# Patient Record
Sex: Male | Born: 1945 | Race: White | Hispanic: No | Marital: Married | Smoking: Never smoker
Health system: Southern US, Community
[De-identification: ages and names within clinical notes are randomized; demographics above are authoritative.]

## PROBLEM LIST (undated history)

## (undated) DIAGNOSIS — C61 Malignant neoplasm of prostate: Secondary | ICD-10-CM

## (undated) DIAGNOSIS — I259 Chronic ischemic heart disease, unspecified: Secondary | ICD-10-CM

## (undated) DIAGNOSIS — H5440 Blindness, one eye, unspecified eye: Secondary | ICD-10-CM

## (undated) DIAGNOSIS — M1611 Unilateral primary osteoarthritis, right hip: Secondary | ICD-10-CM

## (undated) DIAGNOSIS — I10 Essential (primary) hypertension: Secondary | ICD-10-CM

## (undated) DIAGNOSIS — E7800 Pure hypercholesterolemia, unspecified: Secondary | ICD-10-CM

## (undated) DIAGNOSIS — I2510 Atherosclerotic heart disease of native coronary artery without angina pectoris: Secondary | ICD-10-CM

## (undated) HISTORY — DX: Chronic ischemic heart disease, unspecified: I25.9

## (undated) HISTORY — DX: Unilateral primary osteoarthritis, right hip: M16.11

## (undated) HISTORY — DX: Pure hypercholesterolemia, unspecified: E78.00

## (undated) HISTORY — DX: Essential (primary) hypertension: I10

## (undated) HISTORY — DX: Blindness, one eye, unspecified eye: H54.40

## (undated) HISTORY — DX: Malignant neoplasm of prostate: C61

## (undated) HISTORY — DX: Atherosclerotic heart disease of native coronary artery without angina pectoris: I25.10

## (undated) HISTORY — PX: TONSILLECTOMY AND ADENOIDECTOMY: SHX28

---

## 1967-02-17 HISTORY — PX: EYE SURGERY: SHX253

## 1996-06-21 HISTORY — PX: CORONARY ANGIOPLASTY WITH STENT PLACEMENT: SHX49

## 2001-09-27 ENCOUNTER — Encounter: Payer: Self-pay | Admitting: Orthopedic Surgery

## 2001-09-27 ENCOUNTER — Encounter: Admission: RE | Admit: 2001-09-27 | Discharge: 2001-09-27 | Payer: Self-pay | Admitting: Orthopedic Surgery

## 2002-11-07 ENCOUNTER — Ambulatory Visit (HOSPITAL_COMMUNITY): Admission: RE | Admit: 2002-11-07 | Discharge: 2002-11-07 | Payer: Self-pay | Admitting: Gastroenterology

## 2002-11-07 ENCOUNTER — Encounter (INDEPENDENT_AMBULATORY_CARE_PROVIDER_SITE_OTHER): Payer: Self-pay

## 2003-01-22 ENCOUNTER — Encounter (INDEPENDENT_AMBULATORY_CARE_PROVIDER_SITE_OTHER): Payer: Self-pay | Admitting: *Deleted

## 2003-01-22 ENCOUNTER — Ambulatory Visit (HOSPITAL_COMMUNITY): Admission: RE | Admit: 2003-01-22 | Discharge: 2003-01-22 | Payer: Self-pay | Admitting: Gastroenterology

## 2004-03-26 ENCOUNTER — Inpatient Hospital Stay (HOSPITAL_COMMUNITY): Admission: RE | Admit: 2004-03-26 | Discharge: 2004-03-28 | Payer: Self-pay | Admitting: Orthopedic Surgery

## 2007-01-27 ENCOUNTER — Inpatient Hospital Stay (HOSPITAL_COMMUNITY): Admission: RE | Admit: 2007-01-27 | Discharge: 2007-01-29 | Payer: Self-pay | Admitting: Orthopedic Surgery

## 2010-01-13 ENCOUNTER — Ambulatory Visit: Payer: Self-pay | Admitting: Cardiology

## 2010-01-23 ENCOUNTER — Ambulatory Visit: Payer: Self-pay | Admitting: Cardiology

## 2010-07-01 NOTE — Op Note (Signed)
NAME:  Ruben Henry, Ruben Henry              ACCOUNT NO.:  [id number]   MEDICAL RECORD NO.:  [id number]          PATIENT TYPE:  INP   LOCATION:  0005                         FACILITY:  [facility]   PHYSICIAN:  John L. Rendall, M.D.  DATE OF BIRTH:  1945/08/11   DATE OF PROCEDURE:  01/27/2007  DATE OF DISCHARGE:                               OPERATIVE REPORT   PREOPERATIVE DIAGNOSIS:  Osteoarthritis, left hip.   SURGICAL PROCEDURE:  Left AML total hip replacement.   POSTOPERATIVE DIAGNOSIS:  Osteoarthritis, left hip.   SURGEON:  John L. Rendall, M.D.   ASSISTANT:  Legrand Pitts. Duffy, P.A.   ANESTHESIA:  General.   PATHOLOGY:  Bone-on-bone left hip, failure of conservative measures with  total hip already on the opposite side that is functioning well.   PROCEDURE:  Under general anesthesia, the patient is placed in the right  lateral decubitus position and the left hip is prepared with DuraPrep  and draped as a sterile field.  A posterior approach is made.  The IT  band is split in the line of its fibers.  A short Charnley retractor is  inserted.  Multiple small vessels are cauterized.  The short external  rotators and hip capsule are taken down from bone and vessels along the  capsule are cauterized.  The capsule is then opened in a T-shaped manner  after carefully peeling the short external rotators from it.  The hip  was is then dislocated.  The superior femoral neck is exposed.  The IM  initiator and canal finder are used.  The femoral canal is then  progressively reamed to 16 mm.  The femoral neck is then osteotomized.  Trial rasps are then used, 12, 13.5, 15 and 16.5 narrow, which gave the  very best fit.  A calcar reamer was used to smooth the calcar.  With the  16.5 narrow seated just slightly proud a millimeter or two, it exactly  balanced the other hip's appearance on x-ray, which has done  exceptionally well.  At this point the surgeons and assistants rotated  about the table.  The  acetabulum was exposed with two wing retractors  superiorly, to cobras inferiorly.  It should be noted that the capsule  was very carefully peeled from the labrum and the wing retractors placed  in the interval between the posterior bone of the acetabulum and the  capsule.  The labrum was then excised.  The ligamentum teres was  excised.  Progressive reaming was then done, 45, 57, 49, 51, 53 reamers,  and then a 54 reamer and 55 reamer gave excellent bleeding bone.  All  debris was removed including adherent residual ligamentum teres and  labrum and then a trial seating of a 54 acetabulum bottomed out nicely.  A permanent 56 100 Series Ortho-Lock cup was then inserted and bottomed  out nicely with an excellent press fit.  The Sputnik was used to assist  in placement.  Once this was in, trial reduction off a rasp with a 32-mm  hip ball and a +1.5 neck length was tried.  This gave excellent fit,  alignment and stability through a full normal physiologic range of  motion.  Components were then dislocated and apex hole eliminator and a  permanent cross-linked poly were inserted.  The femoral component 16.5  narrow AML stem was then inserted and the +1.5 32-mm hip ball.  With all  these in place, again stability was excellent.  The hip capsule was then  closed with interrupted mattress sutures of #1 Tycron, short external  rotator reattached with #1 Tycron, IT band closed with  #1 Tycron, subcu with 0 Vicryl, 2-0 Vicryl and skin clips.  Total  operative time 1 hour 15 minutes.  Leg lengths were just as we had  planned at the end of inserting the components.  The patient returned to  recovery in good condition.  Blood loss estimated at 200 mL.      John L. Rendall, M.D.  Electronically Signed     JLR/MEDQ  D:  01/27/2007  T:  01/27/2007  Job:  [id number]

## 2010-07-04 NOTE — Op Note (Signed)
NAME:  Ruben Henry, Ruben Henry                        ACCOUNT NO.:  [id number]   MEDICAL RECORD NO.:  [id number]                   PATIENT TYPE:  AMB   LOCATION:  ENDO                                 FACILITY:  [facility]   PHYSICIAN:  Bernette Redbird, M.D.                DATE OF BIRTH:  01-Aug-1945   DATE OF PROCEDURE:  11/07/2002  DATE OF DISCHARGE:                                 OPERATIVE REPORT   PROCEDURE:  Colonoscopy with polypectomy and biopsy.   INDICATIONS:  A 65 year old gentleman who underwent a screening flexible  sigmoidoscopy by Dr. Janey Greaser recently, showing a polyp at about 15 cm.  A  previous sigmoidoscopy about five years earlier had been reportedly normal.   FINDINGS:  Very large (5 cm) polyp at about 12 cm from the external anal  opening.  Several small polyps in the proximal colon.   CONSENT:  The nature, purpose and risks of this procedure had been discussed  with the patient who provided written consent.   SEDATION:  Prior to and during the course of the procedure totaled Fentanyl  75 mcg and Versed 8 mg IV without arrhythmias or desaturation.   DESCRIPTION OF PROCEDURE:  Digital exam of the prostate was normal.   The Olympus adjustable tension pediatric video colonoscope was advanced to  the terminal ileum which had a normal appearance and pull-back was then  performed.  Some external abdominal compression was required to help control  looping.   Just above the cecum was a small 3 x 4 mm polyp, sessile, removed by snare  technique and suctioned through the scope.  In the region of the hepatic  flexure, I encountered two or three small sessile polyps removed by cold  biopsy technique.  The main abnormality on this exam, however, was a  lobulated semi-pedunculated, irregular but benign-appearing polyp at about  12 cm from the external anal opening.  It probably measured 5 cm or even 7  cm across in greatest dimension and probably 4-5 cm wide.  It was partially  attached to the rectal vault but was not fully plastered or fixated to the  vault.  The margin of the fixed portion of the polyp was injected with  1:10,000 epinephrine, a total of about 2 mL.  The polyp was then carved off  in a successive fashion, with virtually complete hemostasis and fairly deep  cautery in the central portion of one section of the polyp but no evidence  of frank perforation.  The margin of the polyp was not completely excised  but was left behind with the anticipation that it would probably slough at a  later time or could be touched up on a future occasion.  I did not want to  apply excessive heat to the polyp base.  Appropriately 5 carvings were done,  removing two or three dominant pieces and then several smaller pieces.  It  is believed  all fragments were retrieved for histologic analysis.  There was  good hemostasis and again no evidence of significant excess cautery at the  conclusion of the procedure.  Retroflexion was not performed.   The patient had no evidence of colitis, vascular ectasia or diverticulosis.   He tolerated the procedure well and there were no apparent complications.   IMPRESSION:  1. Large rectal polyp, mostly removed, as discussed above.  2. Several small polyps in the proximal colon.   PLAN:  1. Await pathology.  2. Anticipate followup sigmoidoscopic evaluation (following a complete prep)     in three to six months for removal of any residual polyp tissue at the     polypectomy site in the rectum.                                                Bernette Redbird, M.D.    RB/MEDQ  D:  11/07/2002  T:  11/07/2002  Job:  [id number]   cc:   Al Decant. Janey Greaser, MD  [address]  [place]  [place] [address]  Fax: [phone]

## 2010-07-04 NOTE — Op Note (Signed)
NAME:  Ruben Henry, Ruben Henry              ACCOUNT NO.:  [id number]   MEDICAL RECORD NO.:  [id number]          PATIENT TYPE:  INP   LOCATION:  0009                         FACILITY:  [facility]   PHYSICIAN:  John L. Rendall III, M.D.DATE OF BIRTH:  1945/05/20   DATE OF PROCEDURE:  03/26/2004  DATE OF DISCHARGE:                                 OPERATIVE REPORT   PREOPERATIVE DIAGNOSIS:  Osteoarthritis, right hip.   POSTOPERATIVE DIAGNOSIS:  Osteoarthritis, right hip.   SURGICAL PROCEDURE:  Right AML total hip replacement.   SURGEON:  John L. Rendall, M.D.   ASSISTANT:  Legrand Pitts. Duffy, P.A.   ANESTHESIA:  Spinal.   PATHOLOGY:  The patient has bone against bone of right hip with unremitting  pain despite conservative measures.   DESCRIPTION OF PROCEDURE:  Under spinal anesthesia, the patient is placed in  the left lateral decubitus position.  The hip is prepared with Duraprep and  draped as a sterile field.  An approximate 5-inch incision is made.  The IT  band is splint in line of its fibers.  A short Charnley is inserted.  The  hip is internally rotated and the short external rotators and hip capsule  are taken from bone with electrocautery.  The femoral neck is exposed.  The  short external rotators are freed from the hip capsule.  The hip capsule is  then opened in a T-shaped manner.  There are multiple small vessels which  are cauterized.  Once this is completed, the hip is dislocated.  The  superior femoral neck is exposed.  The IM initiator and canal finder are  used.  The femoral canal is then progressively reamed to 16 mm.  The femoral  neck is then osteotomized one fingerbreadth above the less trochanter.  The  angle is judged by the template.  Once this is done, the canal is rasped  with 12 narrow, 12 large, 13.5 large, 15 large and 16.5 large.  Calcar  reamers are used.  At that point, the acetabulum is then exposed.  It is two  cobra retractors inferiorly.  The labrum is then  teased from the capsule and  the capsule is elevated, sweeping back soft tissues over the capsule and  medium and large wing retractors are inserted in the superior acetabulum  above the rim of the acetabulum about 1.5 cm.  Once this is done, the labrum  is excised.  The ligamentum teres is removed.  The acetabulum is then  progressively reamed from 47 to 49 to 51 to 53, 54 55 and then a 56 100  series acetabular component is inserted.  It should be noted that excellent  hard bone was encountered still in some of the cortical bone, but it was  nevertheless bleeding.  There was still about 3-4 mm of a large fovea in the  center of the acetabulum.  This was decorticated with a large curet and bone  graft from the femoral head was inserted to fill this space as the cup would  ride above it otherwise.  A reverse running reamer was then inserted to pack  the bone graft into the space and the #56 100 series acetabulum was inserted  using the Sputnik to assist in aiming for anteversion and flexion.  Once  this was inserted, a trial 56 poly was screwed in place and the hip was  assembled.  It was found to be stable through normal range of motion and  internal rotation of over 40 degrees.  At this point, the trial components  were removed from the femur and a apex hole eliminator was inserted.  The  poly was inserted, the cross linked poly for a 32 head, 56 outside diameter.  A 16.5 large AML stem was then inserted with a +1 32 mm ball.  At this  point, the hip was again tested for stability and was found quite stable.  The hip capsule was then repaired with #1 Ticron.  Short external rotators  reattached.  The IT band was then closed with #1 Ticron, 0 and 2-0 Vicryl and skin clips.  Operative time an hour and 15 minutes.  Blood loss 200 mL.  The patient  tolerated the procedure well and returned to recovery in good condition.  There was no drain felt necessary.      JLR/MEDQ  D:  03/26/2004   T:  03/26/2004  Job:  [id number]

## 2010-07-04 NOTE — Op Note (Signed)
NAME:  Ruben, Henry                        ACCOUNT NO.:  [id number]   MEDICAL RECORD NO.:  [id number]                   PATIENT TYPE:  AMB   LOCATION:  ENDO                                 FACILITY:  [facility]   PHYSICIAN:  Bernette Redbird, M.D.                DATE OF BIRTH:  [birth year]   DATE OF PROCEDURE:  01/22/2003  DATE OF DISCHARGE:                                 OPERATIVE REPORT   PROCEDURE:  Flexible sigmoidoscopy with polypectomy and biopsy.   INDICATIONS FOR PROCEDURE:  65 year old gentleman who is about 2 1/2 months  status post removal of a very large, roughly 7 cm rectal polyp by multiple  carvings.  This is being done to check for residual tissue and remove any  that is present.   FINDINGS:  Several small to medium size rectal polyps present plus a  moderate amount of residual polyp tissue at previous polypectomy site,  measuring about 2-3 cm across in various dimensions.   PROCEDURE:  The procedure was familiar to the patient from prior examination  and he provided written consent.  He underwent a standard colonoscopy prep  but this was done only as a flexible sigmoidoscopy without intravenous line  or IV sedation.  Digital exam of the prostate was normal.  The Olympus  pediatric video colonoscope was advanced to about 40 cm and pull back was  then performed.  Residual polyp tissue at 12 cm corresponding to the  previous polypectomy site which still had a slight white scar was identified  and noted to extend about 3 cm or so in greatest dimension but it was not  very bulky or three dimensional in amount.  In the distal rectum was a 6 mm  semipedunculated polyp removed by snare technique and approximately 3-4  additional diminutive sessile polyps removed by cold biopsy, mostly in the  retroflex position of the scope.  We then directed our attention to the  residual polyp at 12 cm which I injected with saline to elevate it slightly  and then it was snared off in 3-4  separate pieces, excising all visible  polyp tissue.  There was no significant bleeding nor evidence of excessive  cautery.  The polyp fragments were retrieved by suctioning through the  scope.  The site was then injected to either side and in front of it with  [place] ink using the sclerotherapy needle.  The scope was then removed from  the patient who tolerated the procedure well and without apparent  complications.   IMPRESSION:  Rectal polyps including residual polyp tissue, removed as  described above, ICD9 code 211.4.   PLAN:  Await pathology results with early colonoscopic follow up, probably  six months from now.  Bernette Redbird, M.D.    RB/MEDQ  D:  01/22/2003  T:  01/22/2003  Job:  [id number]

## 2010-07-04 NOTE — H&P (Signed)
NAME:  Ruben Henry, Ruben Henry              ACCOUNT NO.:  [id number]   MEDICAL RECORD NO.:  [id number]          PATIENT TYPE:  INP   LOCATION:  NA                           FACILITY:  [facility]   PHYSICIAN:  John L. Rendall, M.D.  DATE OF BIRTH:  01/04/1946   DATE OF ADMISSION:  03/26/2004  DATE OF DISCHARGE:                                HISTORY & PHYSICAL   PREADMISSION HISTORY AND PHYSICAL   CHIEF COMPLAINT:  Right thigh pain.   HISTORY OF PRESENT ILLNESS:  The patient is a 65 year old white male with  about a 1-year history of progressively worsening right thigh pain. The pain  is severe, sharp, muscle-pull-type pain in the anteromedial aspect of the  thigh with long periods of sitting and initiation of ambulation. The patient  does have difficulty with range of motion. He has problems tying his shoes  and putting on his socks. He denies any popping or grinding of the hip. X-  rays reveal end-stage osteoarthritis of the right hip.   ALLERGIES:  No known drug allergies.   CURRENT MEDICATIONS:  1.  Aspirin 81 mg p.o. daily.  2.  Flonase 1 spray daily.   PAST MEDICAL HISTORY:  1.  Coronary artery disease with a stent in 1998.  2.  Seasonal allergies.   PAST SURGICAL HISTORY:  1.  Tonsillectomy.  2.  Colonoscopy with polypectomy.   The patient denies any problems with the previous surgical procedures.   SOCIAL HISTORY:  The patient is a 65 year old healthy-appearing white male.  He denies any smoking. He does have the occasional alcoholic beverage. He is  married. He lives in a 2-story house, all living amenities are on the main  floor. He is currently employed and working as an Art gallery manager.   FAMILY PHYSICIAN:  Al Decant. Janey Greaser, M.D.   CARDIOLOGIST:  Colleen Can. Deborah Chalk, M.D., not seen for a couple of years.   FAMILY MEDICAL HISTORY:  Mother is deceased at 19 years of age with coronary  artery disease. Father is deceased at 81 years of age with esophageal  cancer. Two sisters  alive at 54 and 73, one with breast cancer, the other in  good medical health.   REVIEW OF SYSTEMS:  Positive for glasses at all times, otherwise all other  categories are negative and noncontributory at this time.   PHYSICAL EXAMINATION:  VITAL SIGNS:  Height is 5 feet 10 inches, weight is  190 pounds. Blood pressure is 142/100, repeated in 5 minutes at 140/98.  Pulse of 80 and regular. Respirations 12. The patient is afebrile.  GENERAL:  This is a healthy-appearing, well-developed white male. He does  ambulate with a very slight limp, but he is able to get in and out of the  chair fairly easy and on and off of the exam table without any assistance.  HEENT:  Head was normocephalic. Pupils equal, round and reactive.  Extraocular movements intact. External ears were without deformity. His  gross hearing is intact. Oral buccal mucosa was pink and moist.  NECK:  Supple, no palpable lymphadenopathy, thyroid region was nontender.  The patient had  good range of motion of his cervical spine without any  difficulty or tenderness.  HEART:  Regular rate and rhythm; no murmurs, rubs or gallops.  LUNGS:  Clear and equal bilaterally; no wheezes, rales, rhonchi.  ABDOMEN:  Soft and nontender, bowel sounds were normoactive throughout. The  CVA region was nontender.  EXTREMITIES:  Upper extremities were symmetrical in size and shape; he had  full range of motion of his shoulder, elbows, wrists. Motor strength was  5/5. Lower extremities: Right hip had full extension, flexion up to 120  degrees. He had 0 degrees of internal rotation, limited by pain and  mechanical locking. He had about 10 degrees of external rotation, limited by  pain. Left hip had full extension, flexion up to 130 degrees. He had 15  degrees' external rotation and about 20 degrees' internal rotation with no  discomfort. Bilateral knees had full extension, flexion back to 120 degrees  with no instability or discomfort, no palpable  effusions. Calves are soft  and nontender. Ankles had symmetrical shape with good dorsi and plantar  flexion.  PERIPHERAL VASCULAR:  Carotid pulses were 2+, no bruits. Radial pulses were  2+, posterior tibial pulses were 1+. He had no lower extremity edema.  NEUROLOGIC:  The patient was conscious, alert and appropriate, ease of  conversation with examiner. Cranial nerves 2-12 were grossly intact. He had  no gross neurologic deficits noted.  BREASTS, RECTAL AND GENITOURINARY EXAMS:  Deferred at this time.   IMPRESSION:  1.  End-stage osteoarthritis, right hip.  2.  Coronary artery disease, with stent in 1998, with no recent cardiac      workup.   PLAN:  The patient was requested to get a cardiac evaluation by Dr. Duffy Rhody  Tennant's office; the patient had an appointment scheduled on February 1,  with Dr. Reyes Ivan for this evaluation. The patient will otherwise undergo all  of the routine labs and tests prior to having a right total hip arthroplasty  by Dr. Priscille Kluver at [facility] on February 8.      RWK/MEDQ  D:  03/18/2004  T:  03/18/2004  Job:  [id number]

## 2010-11-24 LAB — CROSSMATCH: ABO/RH(D): A POS

## 2010-11-24 LAB — COMPREHENSIVE METABOLIC PANEL
ALT: 30
Alkaline Phosphatase: 54
BUN: 13
GFR calc Af Amer: >60
Glucose, Bld: 106 — ABNORMAL HIGH
Potassium: 4.7
Total Protein: 7.2

## 2010-11-24 LAB — CBC
HCT: 30.6 — ABNORMAL LOW
Hemoglobin: 14.8
Hemoglobin: 9.8 — ABNORMAL LOW
MCHC: 35.2
MCHC: 35.4
MCV: 97.7
MCV: 97.8
Platelets: 200
RBC: 2.83 — ABNORMAL LOW
RBC: 4.31
RDW: 12.8
RDW: 13.1
WBC: 6.9

## 2010-11-24 LAB — URINALYSIS, ROUTINE W REFLEX MICROSCOPIC
Bilirubin Urine: NEGATIVE
Glucose, UA: NEGATIVE
Hgb urine dipstick: NEGATIVE
Specific Gravity, Urine: 1.006
Urobilinogen, UA: 0.2

## 2010-11-24 LAB — BASIC METABOLIC PANEL
BUN: 11
CO2: 28
Calcium: 8.3 — ABNORMAL LOW
Chloride: 103
Creatinine, Ser: 0.8
GFR calc Af Amer: >60
GFR calc non Af Amer: >60
Glucose, Bld: 112 — ABNORMAL HIGH

## 2010-11-24 LAB — DIFFERENTIAL
Basophils Absolute: 0
Basophils Relative: 0
Eosinophils Absolute: 0 — ABNORMAL LOW
Eosinophils Relative: 1
Lymphocytes Relative: 17
Lymphs Abs: 0.9
Monocytes Absolute: 0.7
Monocytes Relative: 13 — ABNORMAL HIGH
Neutrophils Relative %: 69

## 2010-11-24 LAB — APTT: aPTT: 27

## 2010-11-24 LAB — URINE CULTURE
Colony Count: NO GROWTH
Culture: NO GROWTH
Special Requests: NEGATIVE

## 2010-11-24 LAB — PROTIME-INR: INR: 0.9

## 2010-11-24 LAB — ABO/RH: ABO/RH(D): A POS

## 2011-07-12 ENCOUNTER — Encounter: Payer: Self-pay | Admitting: *Deleted

## 2011-09-29 ENCOUNTER — Other Ambulatory Visit: Payer: Self-pay | Admitting: Gastroenterology

## 2011-09-29 DIAGNOSIS — Z09 Encounter for follow-up examination after completed treatment for conditions other than malignant neoplasm: Secondary | ICD-10-CM | POA: Diagnosis not present

## 2011-09-29 DIAGNOSIS — Z86010 Personal history of colonic polyps: Secondary | ICD-10-CM | POA: Diagnosis not present

## 2011-09-29 DIAGNOSIS — D126 Benign neoplasm of colon, unspecified: Secondary | ICD-10-CM | POA: Diagnosis not present

## 2011-09-29 LAB — HM COLONOSCOPY

## 2012-01-07 ENCOUNTER — Telehealth: Payer: Self-pay | Admitting: Nurse Practitioner

## 2012-01-07 NOTE — Telephone Encounter (Signed)
Pt former pt of tennant, want to talk to [facility], re possibly needing a treadmill before following up with mcalhany, pls call

## 2012-01-08 NOTE — Telephone Encounter (Signed)
Would just schedule a visit with myself or Dr. Clifton James and then we will decide.

## 2012-01-08 NOTE — Telephone Encounter (Signed)
t/w pt schedule an appt with Lawson Fiscal on December 4th will reestablish getting paper chart fmr. tennant pt

## 2012-01-13 DIAGNOSIS — Z23 Encounter for immunization: Secondary | ICD-10-CM | POA: Diagnosis not present

## 2012-01-20 ENCOUNTER — Encounter: Payer: Self-pay | Admitting: Nurse Practitioner

## 2012-01-20 ENCOUNTER — Ambulatory Visit (INDEPENDENT_AMBULATORY_CARE_PROVIDER_SITE_OTHER): Payer: Medicare Other | Admitting: Nurse Practitioner

## 2012-01-20 VITALS — BP 132/94 | HR 68 | Ht 70.0 in | Wt 191.1 lb

## 2012-01-20 DIAGNOSIS — E785 Hyperlipidemia, unspecified: Secondary | ICD-10-CM

## 2012-01-20 DIAGNOSIS — I259 Chronic ischemic heart disease, unspecified: Secondary | ICD-10-CM | POA: Diagnosis not present

## 2012-01-20 DIAGNOSIS — E7800 Pure hypercholesterolemia, unspecified: Secondary | ICD-10-CM | POA: Insufficient documentation

## 2012-01-20 DIAGNOSIS — I10 Essential (primary) hypertension: Secondary | ICD-10-CM | POA: Diagnosis not present

## 2012-01-20 LAB — BASIC METABOLIC PANEL
BUN: 15 mg/dL (ref 6–23)
CO2: 27 mEq/L (ref 19–32)
Calcium: 9.1 mg/dL (ref 8.4–10.5)
Chloride: 100 mEq/L (ref 96–112)
Creatinine, Ser: 0.9 mg/dL (ref 0.4–1.5)
GFR: 96.04 mL/min (ref >60.00)
Glucose, Bld: 113 mg/dL — ABNORMAL HIGH (ref 70–99)
Potassium: 4.3 mEq/L (ref 3.5–5.1)
Sodium: 134 mEq/L — ABNORMAL LOW (ref 135–145)

## 2012-01-20 LAB — HEPATIC FUNCTION PANEL
ALT: 52 U/L (ref 0–53)
AST: 32 U/L (ref 0–37)
Albumin: 4.4 g/dL (ref 3.5–5.2)
Alkaline Phosphatase: 46 U/L (ref 39–117)
Bilirubin, Direct: 0.1 mg/dL (ref 0.0–0.3)
Total Bilirubin: 0.8 mg/dL (ref 0.3–1.2)
Total Protein: 7.4 g/dL (ref 6.0–8.3)

## 2012-01-20 LAB — LIPID PANEL
Cholesterol: 263 mg/dL — ABNORMAL HIGH (ref 0–200)
HDL: 73.3 mg/dL (ref >39.00)
Total CHOL/HDL Ratio: 4
Triglycerides: 93 mg/dL (ref 0.0–149.0)
VLDL: 18.6 mg/dL (ref 0.0–40.0)

## 2012-01-20 LAB — LDL CHOLESTEROL, DIRECT: Direct LDL: 184.8 mg/dL

## 2012-01-20 NOTE — Patient Instructions (Addendum)
Stay on your current medicines  Stay active  Goal for your BP is less than 135/85  Dr. Swaziland will see you in 6 months  We will check labs today  Call the [facility] office at [phone] if you have any questions, problems or concerns.

## 2012-01-20 NOTE — Progress Notes (Signed)
Edwena Felty Date of Birth: August 21, 1945 Medical Record #[id number]  History of Present Illness: Ruben Henry is seen back today for a follow up visit. He is a former patient of Dr. Ronnald Nian. Last seen in November of 2011. Has known CAD with remote angioplasty of the RCA and stenting of the LCX in 1998. 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 Last stress study dates back to 2009.   He comes in today. He is here alone. He is doing ok. Remains very active. No chest pain. Not short of breath. Not dizzy or lightheaded. Tolerating his medicines. Blood pressure at home is in the 120/80 range fairly consistently. He has changed to Medicare which is why he has not been here in 2 years. Has had issues trying to get his statin therapy. Despite appeals, he has been denied for his Livelo. All other statins caused increased LFTs. He is now doing some OTC red yeast rice. He has not had labs in 6 months and is fasting today. Overall, he is doing well.   Current Outpatient Prescriptions on File Prior to Visit  Medication Sig Dispense Refill  . aspirin 81 MG tablet Take 81 mg by mouth daily.      . fluticasone (FLONASE) 50 MCG/ACT nasal spray Place 2 sprays into the nose as needed.      Marland Kitchen lisinopril (PRINIVIL,ZESTRIL) 5 MG tablet Take 10 mg by mouth daily.       . Omega-3 Fatty Acids (OMEGA 3 PO) Take 1 capsule by mouth daily.      . Sildenafil Citrate (VIAGRA PO) Take 1 tablet by mouth as needed.        No Known Allergies  Past Medical History  Diagnosis Date  . Hypercholesterolemia   . Ischemic heart disease     Prior angioplasty to the RCA with stenting of the LCX in 1998  . Prostate cancer   . Osteoarthritis of right hip   . Hypertension   . 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 Tonsillectomy and adenoidectomy   . Eye surgery 1969    Right  eye /   . Coronary angioplasty with stent placement 06/21/1996    prior angioplasty of the RCA with stenting of the LCX in 1998    History  Smoking status  . Never Smoker   Smokeless tobacco  . Never Used    History  Alcohol Use No    Family History  Problem Relation Age of Onset  . Cancer Father   . Heart failure Mother   . Heart attack Maternal Uncle 67  . Coronary artery disease Maternal Uncle 62    with CABG    Review of Systems: The review of systems is per the HPI.  All other systems were reviewed and are negative.  Physical Exam: BP 132/94  Pulse 68  Ht 5\' 10"  (1.778 m)  Wt 191 lb 1.9 oz (86.691 kg)  BMI 27.42 kg/m2 Patient is very pleasant and in no acute distress. Skin is warm and dry. Color is normal.  HEENT is unremarkable. Normocephalic/atraumatic. PERRL. Sclera are nonicteric. Neck is supple. No masses. No JVD. Lungs are clear. Cardiac exam shows a regular rate and rhythm. Abdomen is soft. Extremities are without edema. Gait and ROM are intact. No gross neurologic deficits noted.   LABORATORY DATA: Pending  for today.   Lab Results  Component Value Date   WBC 6.9 01/29/2007   HGB 9.8* 01/29/2007   HCT 27.6* 01/29/2007   PLT 205 01/29/2007   GLUCOSE 112* 01/29/2007   ALT 30 01/24/2007   AST 23 01/24/2007   NA 135 01/29/2007   K 3.8 01/29/2007   CL 103 01/29/2007   CREATININE 0.85 01/29/2007   BUN 9 01/29/2007   CO2 28 01/29/2007   INR 0.9 01/24/2007     Assessment / Plan:  1. Ischemia heart disease - doing well clinically. No symptoms. Will have him see Dr. Swaziland in 6 months. Consider stress testing on return. He is currently asymptomatic.   2. HTN - blood pressure at home is doing well. No change in his medicines. I have asked him to continue to monitor.   3. HLD - off of Livelo due to insurance reasons. Now on OTC red yeast rice. Will recheck his labs today.   Patient is agreeable to this plan and will call if any problems develop in the  interim.

## 2012-01-21 DIAGNOSIS — C61 Malignant neoplasm of prostate: Secondary | ICD-10-CM | POA: Diagnosis not present

## 2012-01-25 ENCOUNTER — Telehealth: Payer: Self-pay | Admitting: *Deleted

## 2012-01-25 ENCOUNTER — Other Ambulatory Visit: Payer: Self-pay | Admitting: *Deleted

## 2012-01-25 DIAGNOSIS — E785 Hyperlipidemia, unspecified: Secondary | ICD-10-CM

## 2012-01-25 MED ORDER — EZETIMIBE 10 MG PO TABS: 10.0000 mg | ORAL_TABLET | Freq: Every day | ORAL | Status: DC

## 2012-01-25 NOTE — Telephone Encounter (Signed)
t/w pt started him on zetia 10 mg daily pt agreed per lab results and will come back for a fasting lab in 4 weeks pt verbally understood

## 2012-01-27 DIAGNOSIS — Z136 Encounter for screening for cardiovascular disorders: Secondary | ICD-10-CM | POA: Diagnosis not present

## 2012-01-27 DIAGNOSIS — Z23 Encounter for immunization: Secondary | ICD-10-CM | POA: Diagnosis not present

## 2012-01-27 DIAGNOSIS — Z0100 Encounter for examination of eyes and vision without abnormal findings: Secondary | ICD-10-CM | POA: Diagnosis not present

## 2012-01-27 DIAGNOSIS — I2510 Atherosclerotic heart disease of native coronary artery without angina pectoris: Secondary | ICD-10-CM | POA: Diagnosis not present

## 2012-01-27 DIAGNOSIS — Z135 Encounter for screening for eye and ear disorders: Secondary | ICD-10-CM | POA: Diagnosis not present

## 2012-02-24 ENCOUNTER — Other Ambulatory Visit: Payer: Self-pay | Admitting: *Deleted

## 2012-02-24 ENCOUNTER — Other Ambulatory Visit (INDEPENDENT_AMBULATORY_CARE_PROVIDER_SITE_OTHER): Payer: Medicare Other

## 2012-02-24 DIAGNOSIS — E785 Hyperlipidemia, unspecified: Secondary | ICD-10-CM | POA: Diagnosis not present

## 2012-02-24 DIAGNOSIS — I10 Essential (primary) hypertension: Secondary | ICD-10-CM

## 2012-02-24 LAB — LIPID PANEL
Cholesterol: 210 mg/dL — ABNORMAL HIGH (ref 0–200)
HDL: 79.9 mg/dL (ref >39.00)
Total CHOL/HDL Ratio: 3
Triglycerides: 80 mg/dL (ref 0.0–149.0)
VLDL: 16 mg/dL (ref 0.0–40.0)

## 2012-02-24 LAB — HEPATIC FUNCTION PANEL
ALT: 52 U/L (ref 0–53)
AST: 41 U/L — ABNORMAL HIGH (ref 0–37)
Albumin: 4 g/dL (ref 3.5–5.2)
Alkaline Phosphatase: 42 U/L (ref 39–117)
Bilirubin, Direct: 0 mg/dL (ref 0.0–0.3)
Total Bilirubin: 0.8 mg/dL (ref 0.3–1.2)
Total Protein: 6.9 g/dL (ref 6.0–8.3)

## 2012-02-24 LAB — LDL CHOLESTEROL, DIRECT: Direct LDL: 103.5 mg/dL

## 2012-03-01 ENCOUNTER — Encounter: Payer: Self-pay | Admitting: Nurse Practitioner

## 2012-03-23 ENCOUNTER — Other Ambulatory Visit: Payer: Medicare Other

## 2012-03-24 ENCOUNTER — Other Ambulatory Visit (INDEPENDENT_AMBULATORY_CARE_PROVIDER_SITE_OTHER): Payer: Medicare Other

## 2012-03-24 DIAGNOSIS — E785 Hyperlipidemia, unspecified: Secondary | ICD-10-CM

## 2012-03-24 DIAGNOSIS — I10 Essential (primary) hypertension: Secondary | ICD-10-CM | POA: Diagnosis not present

## 2012-03-24 LAB — HEPATIC FUNCTION PANEL
ALT: 74 U/L — ABNORMAL HIGH (ref 0–53)
AST: 55 U/L — ABNORMAL HIGH (ref 0–37)
Albumin: 4.1 g/dL (ref 3.5–5.2)
Alkaline Phosphatase: 48 U/L (ref 39–117)
Bilirubin, Direct: 0 mg/dL (ref 0.0–0.3)
Total Bilirubin: 0.8 mg/dL (ref 0.3–1.2)
Total Protein: 7.1 g/dL (ref 6.0–8.3)

## 2012-03-24 LAB — LIPID PANEL
Cholesterol: 211 mg/dL — ABNORMAL HIGH (ref 0–200)
HDL: 78.9 mg/dL (ref >39.00)
Total CHOL/HDL Ratio: 3
Triglycerides: 88 mg/dL (ref 0.0–149.0)
VLDL: 17.6 mg/dL (ref 0.0–40.0)

## 2012-03-24 LAB — LDL CHOLESTEROL, DIRECT: Direct LDL: 106.6 mg/dL

## 2012-03-28 ENCOUNTER — Telehealth: Payer: Self-pay | Admitting: Nurse Practitioner

## 2012-03-28 NOTE — Telephone Encounter (Signed)
New Problem:    Patient returned Debbie's call regarding his latest lab results

## 2012-03-29 ENCOUNTER — Other Ambulatory Visit: Payer: Self-pay | Admitting: *Deleted

## 2012-03-29 NOTE — Telephone Encounter (Signed)
Results given to pt

## 2012-04-02 ENCOUNTER — Other Ambulatory Visit: Payer: Self-pay

## 2012-04-19 ENCOUNTER — Other Ambulatory Visit: Payer: Self-pay | Admitting: Nurse Practitioner

## 2012-04-19 ENCOUNTER — Encounter: Payer: Self-pay | Admitting: Nurse Practitioner

## 2012-04-19 DIAGNOSIS — R7989 Other specified abnormal findings of blood chemistry: Secondary | ICD-10-CM

## 2012-04-19 DIAGNOSIS — R748 Abnormal levels of other serum enzymes: Secondary | ICD-10-CM

## 2012-04-26 DIAGNOSIS — C61 Malignant neoplasm of prostate: Secondary | ICD-10-CM | POA: Diagnosis not present

## 2012-04-27 ENCOUNTER — Other Ambulatory Visit (INDEPENDENT_AMBULATORY_CARE_PROVIDER_SITE_OTHER): Payer: Medicare Other

## 2012-04-27 DIAGNOSIS — R7401 Elevation of levels of liver transaminase levels: Secondary | ICD-10-CM | POA: Diagnosis not present

## 2012-04-27 DIAGNOSIS — R748 Abnormal levels of other serum enzymes: Secondary | ICD-10-CM

## 2012-04-27 DIAGNOSIS — R7402 Elevation of levels of lactic acid dehydrogenase (LDH): Secondary | ICD-10-CM

## 2012-04-27 LAB — HEPATIC FUNCTION PANEL
ALT: 59 U/L — ABNORMAL HIGH (ref 0–53)
AST: 38 U/L — ABNORMAL HIGH (ref 0–37)
Albumin: 4.3 g/dL (ref 3.5–5.2)
Alkaline Phosphatase: 46 U/L (ref 39–117)
Bilirubin, Direct: 0 mg/dL (ref 0.0–0.3)
Total Bilirubin: 1 mg/dL (ref 0.3–1.2)
Total Protein: 7.4 g/dL (ref 6.0–8.3)

## 2012-05-09 DIAGNOSIS — R319 Hematuria, unspecified: Secondary | ICD-10-CM | POA: Diagnosis not present

## 2012-05-09 DIAGNOSIS — R3129 Other microscopic hematuria: Secondary | ICD-10-CM | POA: Diagnosis not present

## 2012-05-09 DIAGNOSIS — R945 Abnormal results of liver function studies: Secondary | ICD-10-CM | POA: Diagnosis not present

## 2012-06-06 DIAGNOSIS — C61 Malignant neoplasm of prostate: Secondary | ICD-10-CM | POA: Diagnosis not present

## 2012-06-30 DIAGNOSIS — N529 Male erectile dysfunction, unspecified: Secondary | ICD-10-CM | POA: Diagnosis not present

## 2012-09-21 ENCOUNTER — Other Ambulatory Visit: Payer: Self-pay

## 2012-12-15 DIAGNOSIS — I10 Essential (primary) hypertension: Secondary | ICD-10-CM | POA: Diagnosis not present

## 2012-12-15 DIAGNOSIS — N529 Male erectile dysfunction, unspecified: Secondary | ICD-10-CM | POA: Diagnosis not present

## 2012-12-15 DIAGNOSIS — Z23 Encounter for immunization: Secondary | ICD-10-CM | POA: Diagnosis not present

## 2012-12-15 DIAGNOSIS — E785 Hyperlipidemia, unspecified: Secondary | ICD-10-CM | POA: Diagnosis not present

## 2012-12-19 DIAGNOSIS — Z8546 Personal history of malignant neoplasm of prostate: Secondary | ICD-10-CM | POA: Diagnosis not present

## 2012-12-19 DIAGNOSIS — M1990 Unspecified osteoarthritis, unspecified site: Secondary | ICD-10-CM | POA: Diagnosis not present

## 2012-12-19 DIAGNOSIS — Z9861 Coronary angioplasty status: Secondary | ICD-10-CM | POA: Diagnosis not present

## 2012-12-19 DIAGNOSIS — I10 Essential (primary) hypertension: Secondary | ICD-10-CM | POA: Diagnosis not present

## 2012-12-19 DIAGNOSIS — N529 Male erectile dysfunction, unspecified: Secondary | ICD-10-CM | POA: Diagnosis not present

## 2012-12-19 DIAGNOSIS — Z01818 Encounter for other preprocedural examination: Secondary | ICD-10-CM | POA: Diagnosis not present

## 2012-12-19 DIAGNOSIS — Z9889 Other specified postprocedural states: Secondary | ICD-10-CM | POA: Diagnosis not present

## 2012-12-19 DIAGNOSIS — I2510 Atherosclerotic heart disease of native coronary artery without angina pectoris: Secondary | ICD-10-CM | POA: Diagnosis not present

## 2012-12-21 DIAGNOSIS — M1990 Unspecified osteoarthritis, unspecified site: Secondary | ICD-10-CM | POA: Diagnosis not present

## 2012-12-21 DIAGNOSIS — Z8546 Personal history of malignant neoplasm of prostate: Secondary | ICD-10-CM | POA: Diagnosis not present

## 2012-12-21 DIAGNOSIS — C61 Malignant neoplasm of prostate: Secondary | ICD-10-CM | POA: Diagnosis not present

## 2012-12-21 DIAGNOSIS — I2510 Atherosclerotic heart disease of native coronary artery without angina pectoris: Secondary | ICD-10-CM | POA: Diagnosis not present

## 2012-12-21 DIAGNOSIS — Z01818 Encounter for other preprocedural examination: Secondary | ICD-10-CM | POA: Diagnosis not present

## 2012-12-21 DIAGNOSIS — N529 Male erectile dysfunction, unspecified: Secondary | ICD-10-CM | POA: Diagnosis not present

## 2012-12-21 DIAGNOSIS — I10 Essential (primary) hypertension: Secondary | ICD-10-CM | POA: Diagnosis not present

## 2012-12-22 ENCOUNTER — Other Ambulatory Visit: Payer: Self-pay

## 2012-12-22 DIAGNOSIS — I10 Essential (primary) hypertension: Secondary | ICD-10-CM | POA: Diagnosis not present

## 2012-12-22 DIAGNOSIS — M1990 Unspecified osteoarthritis, unspecified site: Secondary | ICD-10-CM | POA: Diagnosis not present

## 2012-12-22 DIAGNOSIS — Z8546 Personal history of malignant neoplasm of prostate: Secondary | ICD-10-CM | POA: Diagnosis not present

## 2012-12-22 DIAGNOSIS — Z01818 Encounter for other preprocedural examination: Secondary | ICD-10-CM | POA: Diagnosis not present

## 2012-12-22 DIAGNOSIS — N529 Male erectile dysfunction, unspecified: Secondary | ICD-10-CM | POA: Diagnosis not present

## 2012-12-22 DIAGNOSIS — I2510 Atherosclerotic heart disease of native coronary artery without angina pectoris: Secondary | ICD-10-CM | POA: Diagnosis not present

## 2012-12-29 DIAGNOSIS — N529 Male erectile dysfunction, unspecified: Secondary | ICD-10-CM | POA: Diagnosis not present

## 2013-01-06 DIAGNOSIS — N529 Male erectile dysfunction, unspecified: Secondary | ICD-10-CM | POA: Diagnosis not present

## 2013-01-22 ENCOUNTER — Other Ambulatory Visit: Payer: Self-pay | Admitting: Nurse Practitioner

## 2013-01-27 DIAGNOSIS — N529 Male erectile dysfunction, unspecified: Secondary | ICD-10-CM | POA: Diagnosis not present

## 2013-01-27 DIAGNOSIS — C61 Malignant neoplasm of prostate: Secondary | ICD-10-CM | POA: Diagnosis not present

## 2013-02-07 DIAGNOSIS — C61 Malignant neoplasm of prostate: Secondary | ICD-10-CM | POA: Diagnosis not present

## 2013-02-17 DIAGNOSIS — C61 Malignant neoplasm of prostate: Secondary | ICD-10-CM | POA: Diagnosis not present

## 2013-03-13 DIAGNOSIS — C61 Malignant neoplasm of prostate: Secondary | ICD-10-CM | POA: Diagnosis not present

## 2013-04-21 DIAGNOSIS — M21619 Bunion of unspecified foot: Secondary | ICD-10-CM | POA: Diagnosis not present

## 2013-05-17 DIAGNOSIS — M2010 Hallux valgus (acquired), unspecified foot: Secondary | ICD-10-CM | POA: Diagnosis not present

## 2013-05-17 DIAGNOSIS — M21619 Bunion of unspecified foot: Secondary | ICD-10-CM | POA: Diagnosis not present

## 2013-05-18 ENCOUNTER — Encounter: Payer: Self-pay | Admitting: Cardiology

## 2013-05-24 DIAGNOSIS — H2700 Aphakia, unspecified eye: Secondary | ICD-10-CM | POA: Diagnosis not present

## 2013-06-08 DIAGNOSIS — Z23 Encounter for immunization: Secondary | ICD-10-CM | POA: Diagnosis not present

## 2013-06-20 DIAGNOSIS — C61 Malignant neoplasm of prostate: Secondary | ICD-10-CM | POA: Diagnosis not present

## 2013-06-21 DIAGNOSIS — M21619 Bunion of unspecified foot: Secondary | ICD-10-CM | POA: Diagnosis not present

## 2013-06-21 DIAGNOSIS — M25559 Pain in unspecified hip: Secondary | ICD-10-CM | POA: Diagnosis not present

## 2013-06-21 DIAGNOSIS — Z96649 Presence of unspecified artificial hip joint: Secondary | ICD-10-CM | POA: Diagnosis not present

## 2013-07-20 ENCOUNTER — Encounter: Payer: Self-pay | Admitting: Cardiology

## 2013-07-20 ENCOUNTER — Ambulatory Visit (INDEPENDENT_AMBULATORY_CARE_PROVIDER_SITE_OTHER): Payer: Medicare Other | Admitting: Cardiology

## 2013-07-20 VITALS — BP 132/88 | HR 58 | Resp 16 | Ht 70.0 in | Wt 192.0 lb

## 2013-07-20 DIAGNOSIS — I259 Chronic ischemic heart disease, unspecified: Secondary | ICD-10-CM | POA: Diagnosis not present

## 2013-07-20 DIAGNOSIS — I2510 Atherosclerotic heart disease of native coronary artery without angina pectoris: Secondary | ICD-10-CM | POA: Insufficient documentation

## 2013-07-20 DIAGNOSIS — E785 Hyperlipidemia, unspecified: Secondary | ICD-10-CM | POA: Diagnosis not present

## 2013-07-20 DIAGNOSIS — I10 Essential (primary) hypertension: Secondary | ICD-10-CM | POA: Diagnosis not present

## 2013-07-20 HISTORY — DX: Atherosclerotic heart disease of native coronary artery without angina pectoris: I25.10

## 2013-07-20 LAB — BASIC METABOLIC PANEL
BUN: 14 mg/dL (ref 6–23)
CHLORIDE: 102 meq/L (ref 96–112)
CO2: 27 mEq/L (ref 19–32)
CREATININE: 0.8 mg/dL (ref 0.4–1.5)
Calcium: 9.4 mg/dL (ref 8.4–10.5)
GFR: 101.07 mL/min (ref >60.00)
Glucose, Bld: 102 mg/dL — ABNORMAL HIGH (ref 70–99)
Potassium: 4.2 mEq/L (ref 3.5–5.1)
Sodium: 135 mEq/L (ref 135–145)

## 2013-07-20 LAB — LIPID PANEL
CHOLESTEROL: 201 mg/dL — AB (ref 0–200)
HDL: 66 mg/dL (ref >39.00)
LDL Cholesterol: 124 mg/dL — ABNORMAL HIGH (ref 0–99)
NonHDL: 135
Total CHOL/HDL Ratio: 3
Triglycerides: 56 mg/dL (ref 0.0–149.0)
VLDL: 11.2 mg/dL (ref 0.0–40.0)

## 2013-07-20 LAB — HEPATIC FUNCTION PANEL
ALK PHOS: 63 U/L (ref 39–117)
ALT: 90 U/L — AB (ref 0–53)
AST: 54 U/L — ABNORMAL HIGH (ref 0–37)
Albumin: 4.1 g/dL (ref 3.5–5.2)
BILIRUBIN DIRECT: 0.1 mg/dL (ref 0.0–0.3)
BILIRUBIN TOTAL: 0.7 mg/dL (ref 0.2–1.2)
TOTAL PROTEIN: 7 g/dL (ref 6.0–8.3)

## 2013-07-20 NOTE — Patient Instructions (Signed)
We will check lab work today  We will schedule you for a stress test.  I will see you in one year.

## 2013-07-20 NOTE — Progress Notes (Signed)
Ruben Henry Date of Birth: [birth year] Medical Record #[id number]  History of Present Illness: Ruben Henry is seen back today for a follow up visit. He is a former patient of Dr. Susa Henry.  Has known CAD with remote angioplasty of the RCA and stenting of the LCX with an AVE stent in 1998. 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 Last stress study dates back to 2009.   On follow up today he reports he is feeling well. He has a history of elevated LFTs on statins and red yeast rice. Now only on Zetia. He remains active and denies any chest pain or SOB. No edema or palpitations.   Current Outpatient Prescriptions on File Prior to Visit  Medication Sig Dispense Refill  . aspirin 81 MG tablet Take 81 mg by mouth daily.      . fluticasone (FLONASE) 50 MCG/ACT nasal spray Place 2 sprays into the nose as needed.      Marland Kitchen lisinopril (PRINIVIL,ZESTRIL) 5 MG tablet Take 10 mg by mouth daily.       . Omega-3 Fatty Acids (OMEGA 3 PO) Take 1 capsule by mouth daily.      Marland Kitchen senna (SENOKOT) 8.6 MG tablet Take 1 tablet by mouth daily.      Marland Kitchen ZETIA 10 MG tablet TAKE 1 TABLET BY MOUTH EVERY DAY  30 tablet  0  . Sildenafil Citrate (VIAGRA PO) Take 1 tablet by mouth as needed.       No current facility-administered medications on file prior to visit.    No Known Allergies  Past Medical History  Diagnosis Date  . Hypercholesterolemia   . Ischemic heart disease     Prior angioplasty to the RCA with stenting of the LCX in 1998  . Prostate cancer     brachytherapy  . Osteoarthritis of right hip   . Hypertension   . 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 CAD (coronary artery disease) 07/20/2013    Past Surgical History  Procedure Laterality Date  . Tonsillectomy and adenoidectomy    . Eye surgery  1969    Right eye /   . Coronary angioplasty with stent placement  06/21/1996    prior angioplasty of the RCA with  stenting of the LCX in 1998    History  Smoking status  . Never Smoker   Smokeless tobacco  . Never Used    History  Alcohol Use No    Family History  Problem Relation Age of Onset  . Cancer Father   . Heart failure Mother   . Heart attack Maternal Uncle 67  . Coronary artery disease Maternal Uncle 61    with CABG    Review of Systems: The review of systems is per the HPI.  All other systems were reviewed and are negative.  Physical Exam: BP 132/88  Pulse 58  Resp 16  Ht 5\' 10"  (1.778 m)  Wt 192 lb (87.091 kg)  BMI 27.55 kg/m2 Patient is very pleasant and in no acute distress. Skin is warm and dry. Color is normal.  HEENT is unremarkable. Normocephalic/atraumatic. PERRL. Sclera are nonicteric. Neck is supple. No masses. No JVD. Lungs are clear. Cardiac exam shows a regular rate and rhythm. Abdomen is soft. Extremities are without edema. Gait and ROM are intact. No gross neurologic deficits noted.   LABORATORY DATA: Pending for today.    Lab Results  Component Value Date   WBC 6.9 01/29/2007   HGB 9.8* 01/29/2007   HCT 27.6* 01/29/2007   PLT 205 01/29/2007   GLUCOSE 113* 01/20/2012   CHOL 211* 03/24/2012   TRIG 88.0 03/24/2012   HDL 78.90 03/24/2012   LDLDIRECT 106.6 03/24/2012   ALT 59* 04/27/2012   AST 38* 04/27/2012   NA 134* 01/20/2012   K 4.3 01/20/2012   CL 100 01/20/2012   CREATININE 0.9 01/20/2012   BUN 15 01/20/2012   CO2 27 01/20/2012   INR 0.9 01/24/2007    Ecg: NSR rate 58 bpm. Normal.  Assessment / Plan:  1. CAD/ Ischemia heart disease - s/p PTCA of the RCA and stenting of the LCx with DES. Doing well clinically. No symptoms. Will arrange a follow up GXT.   2. HTN - blood pressure is well controlled.  3. HLD - on Zetia. Intolerant of statins and red yeast rice due to elevated LFTs. Will check fasting lab today.

## 2013-07-28 ENCOUNTER — Other Ambulatory Visit: Payer: Self-pay

## 2013-07-28 DIAGNOSIS — R7401 Elevation of levels of liver transaminase levels: Secondary | ICD-10-CM

## 2013-07-28 DIAGNOSIS — R740 Nonspecific elevation of levels of transaminase and lactic acid dehydrogenase [LDH]: Principal | ICD-10-CM

## 2013-08-02 ENCOUNTER — Telehealth: Payer: Self-pay | Admitting: Cardiology

## 2013-08-02 DIAGNOSIS — R740 Nonspecific elevation of levels of transaminase and lactic acid dehydrogenase [LDH]: Principal | ICD-10-CM

## 2013-08-02 DIAGNOSIS — R7401 Elevation of levels of liver transaminase levels: Secondary | ICD-10-CM

## 2013-08-02 NOTE — Telephone Encounter (Signed)
I spoke with this patient regarding his referral to gastroenterology.  The patient wants to know if he can have more blood work done before scheduling this appointment or should he go ahead and schedule with the gastroenterologist.  He also had additional questions about his recent lab results.

## 2013-08-08 NOTE — Telephone Encounter (Signed)
Returned call to patient he stated he was driving and will call me back tomorrow.Stated he wanted to talk to me about recent lab work.

## 2013-08-09 NOTE — Telephone Encounter (Signed)
Received call from patient he wanted to go over 07/20/13 lab work.Stated he was ready to schedule appointment with GI Dr.for elevated liver functions.Schedulers will call back with appointment with GI.

## 2013-09-04 ENCOUNTER — Other Ambulatory Visit: Payer: Self-pay | Admitting: Gastroenterology

## 2013-09-04 DIAGNOSIS — R7401 Elevation of levels of liver transaminase levels: Secondary | ICD-10-CM | POA: Diagnosis not present

## 2013-09-04 DIAGNOSIS — R7989 Other specified abnormal findings of blood chemistry: Secondary | ICD-10-CM

## 2013-09-04 DIAGNOSIS — R945 Abnormal results of liver function studies: Principal | ICD-10-CM

## 2013-09-04 DIAGNOSIS — R7402 Elevation of levels of lactic acid dehydrogenase (LDH): Secondary | ICD-10-CM | POA: Diagnosis not present

## 2013-09-04 DIAGNOSIS — K7689 Other specified diseases of liver: Secondary | ICD-10-CM | POA: Diagnosis not present

## 2013-09-19 ENCOUNTER — Other Ambulatory Visit: Payer: Medicare Other

## 2013-09-19 ENCOUNTER — Ambulatory Visit (INDEPENDENT_AMBULATORY_CARE_PROVIDER_SITE_OTHER): Payer: Medicare Other | Admitting: Physician Assistant

## 2013-09-19 DIAGNOSIS — I259 Chronic ischemic heart disease, unspecified: Secondary | ICD-10-CM | POA: Diagnosis not present

## 2013-09-19 DIAGNOSIS — E785 Hyperlipidemia, unspecified: Secondary | ICD-10-CM

## 2013-09-19 DIAGNOSIS — C61 Malignant neoplasm of prostate: Secondary | ICD-10-CM | POA: Diagnosis not present

## 2013-09-19 DIAGNOSIS — I2510 Atherosclerotic heart disease of native coronary artery without angina pectoris: Secondary | ICD-10-CM

## 2013-09-19 DIAGNOSIS — I10 Essential (primary) hypertension: Secondary | ICD-10-CM

## 2013-09-19 NOTE — Progress Notes (Signed)
Exercise Treadmill Test  Pre-Exercise Testing Evaluation Rhythm: normal sinus  Rate: 67 bpm     Test  Exercise Tolerance Test Ordering MD: Peter Martinique, MD  Interpreting MD: Richardson Dopp, PA-C  Unique Test No: 1  Treadmill:  1  Indication for ETT: CAD  Contraindication to ETT: No   Stress Modality: exercise - treadmill  Cardiac Imaging Performed: non   Protocol: standard Bruce - maximal  Max BP:  186/92  Max MPHR (bpm):  153 85% MPR (bpm):  130  MPHR obtained (bpm):  153 % MPHR obtained:  100  Reached 85% MPHR (min:sec):  6:14 Total Exercise Time (min-sec):  8:00  Workload in METS:  10.1 Borg Scale: 15  Reason ETT Terminated:  desired heart rate attained    ST Segment Analysis At Rest: normal ST segments - no evidence of significant ST depression With Exercise: non-specific ST changes  Other Information Arrhythmia:  No Angina during ETT:  absent (0) Quality of ETT:  diagnostic  ETT Interpretation:  normal - no evidence of ischemia by ST analysis  Comments: Good exercise capacity. No chest pain. Normal BP response to exercise. Non-specific ST changes with up-sloping ST depression at peak exercise.  No ST changes to suggest ischemia.   Recommendations: F/u with Dr. Peter Martinique as directed. Signed,  Richardson Dopp, PA-C   09/19/2013 9:55 AM

## 2013-09-20 ENCOUNTER — Ambulatory Visit
Admission: RE | Admit: 2013-09-20 | Discharge: 2013-09-20 | Disposition: A | Discharge status: Home / Self Care | Payer: Medicare Other | Source: Ambulatory Visit | Attending: Gastroenterology | Admitting: Gastroenterology

## 2013-09-20 DIAGNOSIS — R945 Abnormal results of liver function studies: Principal | ICD-10-CM

## 2013-09-20 DIAGNOSIS — K7689 Other specified diseases of liver: Secondary | ICD-10-CM | POA: Diagnosis not present

## 2013-09-20 DIAGNOSIS — R7989 Other specified abnormal findings of blood chemistry: Secondary | ICD-10-CM

## 2013-10-27 DIAGNOSIS — I10 Essential (primary) hypertension: Secondary | ICD-10-CM | POA: Insufficient documentation

## 2014-01-05 DIAGNOSIS — C61 Malignant neoplasm of prostate: Secondary | ICD-10-CM | POA: Diagnosis not present

## 2014-01-05 DIAGNOSIS — R740 Nonspecific elevation of levels of transaminase and lactic acid dehydrogenase [LDH]: Secondary | ICD-10-CM | POA: Diagnosis not present

## 2014-01-17 DIAGNOSIS — Z23 Encounter for immunization: Secondary | ICD-10-CM | POA: Diagnosis not present

## 2014-05-08 ENCOUNTER — Telehealth: Payer: Self-pay | Admitting: *Deleted

## 2014-05-08 ENCOUNTER — Encounter: Payer: Self-pay | Admitting: *Deleted

## 2014-05-08 DIAGNOSIS — R7989 Other specified abnormal findings of blood chemistry: Secondary | ICD-10-CM | POA: Diagnosis not present

## 2014-05-08 NOTE — Telephone Encounter (Signed)
Pre-Visit Call completed with patient and chart updated.   Pre-Visit Info documented in Specialty Comments under [employer].    

## 2014-05-09 ENCOUNTER — Encounter: Payer: Self-pay | Admitting: Medical

## 2014-05-09 ENCOUNTER — Ambulatory Visit (INDEPENDENT_AMBULATORY_CARE_PROVIDER_SITE_OTHER): Payer: Medicare Other | Admitting: Medical

## 2014-05-09 VITALS — BP 130/80 | HR 74 | Temp 98.2°F | Ht 70.0 in | Wt 190.8 lb

## 2014-05-09 DIAGNOSIS — I10 Essential (primary) hypertension: Secondary | ICD-10-CM | POA: Diagnosis not present

## 2014-05-09 DIAGNOSIS — E785 Hyperlipidemia, unspecified: Secondary | ICD-10-CM | POA: Diagnosis not present

## 2014-05-09 DIAGNOSIS — Z8546 Personal history of malignant neoplasm of prostate: Secondary | ICD-10-CM | POA: Diagnosis not present

## 2014-05-09 MED ORDER — LISINOPRIL 10 MG PO TABS: 10.0000 mg | ORAL_TABLET | Freq: Every day | ORAL | Status: DC

## 2014-05-09 MED ORDER — EZETIMIBE 10 MG PO TABS: 10.0000 mg | ORAL_TABLET | Freq: Every day | ORAL | Status: DC

## 2014-05-09 NOTE — Assessment & Plan Note (Signed)
Prostate Cancer- treatment in 2006. Pt follows up with Urologist regular basis. Pt PSA last time was 0.01.(Jun 30, 2013).

## 2014-05-09 NOTE — Progress Notes (Signed)
Pre visit review using our clinic review tool, if applicable. No additional management support is needed unless otherwise documented below in the visit note. 

## 2014-05-09 NOTE — Assessment & Plan Note (Signed)
Continue zetia. When specialist does labs if you could get them to send [place] report or would you get copy for our records.

## 2014-05-09 NOTE — Patient Instructions (Signed)
HTN (hypertension) Your blood pressure is well controlled today. Continue lisinopril. New rx today.   HLD (hyperlipidemia) Continue zetia. When specialist does labs if you could get them to send [place] report or would you get copy for our records.     Would you get specialist to send [place] copy of reports when studies are done.  Also you could schedule a wellness exam over next year. If you do so please schedule early am and request 45 minute appointment.  Also please sign release form so we can get [facility] Records.

## 2014-05-09 NOTE — Assessment & Plan Note (Signed)
Your blood pressure is well controlled today. Continue lisinopril. New rx today.

## 2014-05-09 NOTE — Progress Notes (Signed)
Subjective:    Patient ID: Ruben Henry, male    DOB: 10-Nov-1945, 69 y.o.   MRN: [id number]  HPI   I have reviewed pt PMH, PSH, FH, Social History and Surgical History.  Pt in for first time. Pt was former [facility] Pt.   Arthritis- hx of. Some in hips and some in neck.   Prostate Cancer- treatment in 2006. Pt follows up with Urologist regular basis. Pt PSA last time was 0.01.(Jun 30, 2013).   Hyperlipedmia- Pt been on medication for about 10 years. Pt is currently on zetia. Pt sees cardiologist.  Stents years ago- He is on zetia. Statins caused liver enzyme elevation.  Htn- Pt is on lisinopril 10 mg q day.  Rt eye vision problems. Pt depends on left eye.  Engineer, manufacturing systems. Exercise- Pt plays golf or walks 3-4 times a week, Some light weights, 1-2 cup coffee a day, pt diet is good(dieticiain). Married- 2 children.  Pt had liver enzyme test done yesterday.   Review of Systems  Constitutional: Negative for fever, chills and fatigue.  Respiratory: Negative for cough, choking, chest tightness, shortness of breath and wheezing.   Cardiovascular: Negative for chest pain and palpitations.  Gastrointestinal: Negative.   Musculoskeletal: Negative for back pain and neck pain.  Neurological: Negative for dizziness, seizures, syncope, weakness, numbness and headaches.  Hematological: Negative for adenopathy. Does not bruise/bleed easily.  Psychiatric/Behavioral: Negative for behavioral problems and confusion.   Past Medical History  Diagnosis Date  . Hypercholesterolemia   . Ischemic heart disease     Prior angioplasty to the RCA with stenting of the LCX in 1998  . Prostate cancer     brachytherapy  . Osteoarthritis of right hip   . Hypertension   . 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 CAD (coronary artery disease) 07/20/2013    History   Social History  . Marital Status: Married    Spouse Name: N/A  . Number of Children: 2  . Years of  Education: N/A   Occupational History  . engineer    Social History Main Topics  . Smoking status: Never Smoker   . Smokeless tobacco: Never Used  . Alcohol Use: Yes     Comment: couple of beers a day. [employer].  . Drug Use: No  . Sexual Activity: Yes   Other Topics Concern  . Not on file   Social History Narrative    Past Surgical History  Procedure Laterality Date  . Tonsillectomy and adenoidectomy    . Eye surgery  1969    Right eye /   . Coronary angioplasty with stent placement  06/21/1996    prior angioplasty of the RCA with stenting of the LCX in 1998    Family History  Problem Relation Age of Onset  . Cancer Father   . Heart failure Mother   . Heart attack Maternal Uncle 67  . Coronary artery disease Maternal Uncle 61    with CABG    No Known Allergies  Current Outpatient Prescriptions on File Prior to Visit  Medication Sig Dispense Refill  . aspirin 81 MG tablet Take 81 mg by mouth daily.    . fluticasone (FLONASE) 50 MCG/ACT nasal spray Place 2 sprays into the nose as needed.    Marland Kitchen lisinopril (PRINIVIL,ZESTRIL) 5 MG tablet Take 10 mg by mouth daily.     . Omega-3 Fatty Acids (OMEGA 3 PO) Take 1 capsule by mouth  daily.    . senna (SENOKOT) 8.6 MG tablet Take 1 tablet by mouth daily.    . Sildenafil Citrate (VIAGRA PO) Take 1 tablet by mouth as needed.    Marland Kitchen ZETIA 10 MG tablet TAKE 1 TABLET BY MOUTH EVERY DAY 30 tablet 0   No current facility-administered medications on file prior to visit.    BP 140/91 mmHg  Pulse 74  Temp(Src) 98.2 F (36.8 C) (Oral)  Ht 5\' 10"  (1.778 m)  Wt 190 lb 12.8 oz (86.546 kg)  BMI 27.38 kg/m2  SpO2 99%      Objective:   Physical Exam  General Mental Status- Alert. General Appearance- Not in acute distress.   Skin General: Color- Normal Color. Moisture- Normal Moisture.  Neck Carotid Arteries- Normal color. Moisture- Normal Moisture. No carotid bruits. No JVD.  Chest and Lung Exam Auscultation: Breath  Sounds:-Normal.  Cardiovascular Auscultation:Rythm- Regular. Murmurs & Other Heart Sounds:Auscultation of the heart reveals- No Murmurs.  Abdomen Inspection:-Inspeection Normal. Palpation/Percussion:Note:No mass. Palpation and Percussion of the abdomen reveal- Non Tender, Non Distended + BS, no rebound or guarding.   Neurologic Cranial Nerve exam:- CN III-XII intact(No nystagmus), symmetric smile. Strength:- 5/5 equal and symmetric strength both upper and lower extremities.      Assessment & Plan:

## 2014-05-22 ENCOUNTER — Telehealth: Payer: Self-pay | Admitting: *Deleted

## 2014-05-22 NOTE — Telephone Encounter (Signed)
Medical records received via fax from [facility]. Forwarded to Karen/Edward. JG//CMA

## 2014-05-29 DIAGNOSIS — Z6826 Body mass index (BMI) 26.0-26.9, adult: Secondary | ICD-10-CM | POA: Diagnosis not present

## 2014-05-29 DIAGNOSIS — Q742 Other congenital malformations of lower limb(s), including pelvic girdle: Secondary | ICD-10-CM | POA: Diagnosis not present

## 2014-05-29 DIAGNOSIS — M898X9 Other specified disorders of bone, unspecified site: Secondary | ICD-10-CM | POA: Diagnosis not present

## 2014-12-03 DIAGNOSIS — C61 Malignant neoplasm of prostate: Secondary | ICD-10-CM | POA: Diagnosis not present

## 2015-01-17 DIAGNOSIS — R05 Cough: Secondary | ICD-10-CM | POA: Diagnosis not present

## 2015-01-17 DIAGNOSIS — J209 Acute bronchitis, unspecified: Secondary | ICD-10-CM | POA: Diagnosis not present

## 2015-02-02 DIAGNOSIS — Z23 Encounter for immunization: Secondary | ICD-10-CM | POA: Diagnosis not present

## 2015-02-28 DIAGNOSIS — R740 Nonspecific elevation of levels of transaminase and lactic acid dehydrogenase [LDH]: Secondary | ICD-10-CM | POA: Diagnosis not present

## 2015-04-29 ENCOUNTER — Other Ambulatory Visit: Payer: Self-pay | Admitting: Medical

## 2015-04-29 NOTE — Telephone Encounter (Signed)
Pt wanted refill of zetia and lisinopril. I did give refill. But he has not been seen in one year. Kindly ask if he intends to return.I can't continue to fill medication without ever seeing him or getting up to date labs. Let me know what he says.Try to get him to commit and go ahead and schedule appointment within next month.

## 2015-04-30 NOTE — Telephone Encounter (Signed)
Spoke with pt and he voices understanding. Pt will call back to set up an appointment in the next month.

## 2015-05-16 DIAGNOSIS — J111 Influenza due to unidentified influenza virus with other respiratory manifestations: Secondary | ICD-10-CM | POA: Diagnosis not present

## 2015-07-25 DIAGNOSIS — C61 Malignant neoplasm of prostate: Secondary | ICD-10-CM | POA: Diagnosis not present

## 2015-10-23 ENCOUNTER — Other Ambulatory Visit: Payer: Self-pay | Admitting: Medical

## 2015-10-30 DIAGNOSIS — H2701 Aphakia, right eye: Secondary | ICD-10-CM | POA: Diagnosis not present

## 2015-10-30 DIAGNOSIS — Z0100 Encounter for examination of eyes and vision without abnormal findings: Secondary | ICD-10-CM | POA: Diagnosis not present

## 2015-11-01 ENCOUNTER — Telehealth: Payer: Self-pay | Admitting: Medical

## 2015-11-01 ENCOUNTER — Ambulatory Visit (INDEPENDENT_AMBULATORY_CARE_PROVIDER_SITE_OTHER): Payer: Medicare Other | Admitting: Medical

## 2015-11-01 ENCOUNTER — Encounter: Payer: Self-pay | Admitting: Medical

## 2015-11-01 VITALS — BP 124/86 | HR 88 | Temp 98.3°F | Ht 70.0 in | Wt 197.6 lb

## 2015-11-01 DIAGNOSIS — Z86010 Personal history of colonic polyps: Secondary | ICD-10-CM

## 2015-11-01 DIAGNOSIS — E785 Hyperlipidemia, unspecified: Secondary | ICD-10-CM

## 2015-11-01 DIAGNOSIS — I10 Essential (primary) hypertension: Secondary | ICD-10-CM | POA: Diagnosis not present

## 2015-11-01 DIAGNOSIS — Z0000 Encounter for general adult medical examination without abnormal findings: Secondary | ICD-10-CM | POA: Diagnosis not present

## 2015-11-01 DIAGNOSIS — Z23 Encounter for immunization: Secondary | ICD-10-CM | POA: Diagnosis not present

## 2015-11-01 MED ORDER — EZETIMIBE 10 MG PO TABS: ORAL_TABLET | ORAL | 1 refills | Status: DC

## 2015-11-01 MED ORDER — LISINOPRIL 10 MG PO TABS: ORAL_TABLET | ORAL | 1 refills | Status: DC

## 2015-11-01 NOTE — Patient Instructions (Addendum)
Bring a copy of your advanced directives to your next office visit.  Continue to eat heart healthy diet (full of fruits, vegetables, whole grains, lean protein, water--limit salt, fat, and sugar intake) and increase physical activity as tolerated.  Continue doing brain stimulating activities (puzzles, reading, adult coloring books, staying active) to keep memory sharp.   Follow-up with Mackie Pai, PA-C in 6 months. Schedule FASTING labs as soon as possible at your convenience.

## 2015-11-01 NOTE — Telephone Encounter (Signed)
Pt her for annual wellness. He states he told front receptionist staff. He was scheduled for 15 minute med refill visit. I was already running behind. Irena Reichmann RN was available for wellness exam. I apologized to pt for the scheduling mistake. Explained that Caryl Pina would do wellness exam(this is done routinley by her/RN). I would refill his meds that are needed. Pt expressed understanding. Caryl Pina agreed to do wellness.

## 2015-11-01 NOTE — Progress Notes (Signed)
Subjective:   Ruben Henry is a 70 y.o. male who presents for an Initial Medicare Annual Wellness Visit.  Review of Systems  No ROS.  Medicare Wellness Visit.  Cardiac Risk Factors include: advanced age (>58men, >55 women);dyslipidemia;hypertension;male gender  Sleep patterns: No sleep issues. Does not get up to void.   Home Safety/Smoke Alarms: Lives in home w/ wife. Feels safe in home.   Living environment; residence and Firearm Safety: Stored in a safe place. Seat Belt Safety/Bike Helmet: Wears seat belt.   Counseling:   Eye Exam- Dr. Claudean Kinds in [place] every 2 years. Dental- Dr. Dub Amis every 6 months.  Male:   CCS- last 09/29/11 w/ Dr. Cristina Gong, 5 year recall PSA- follows w/ urology Dr. Harlow Asa.     Objective:    Today's Vitals   11/01/15 0912  BP: 124/86  Pulse: 88  Temp: 98.3 F (36.8 C)  TempSrc: Oral  SpO2: 97%  Weight: 197 lb 9.6 oz (89.6 kg)  Height: 5\' 10"  (1.778 m)   Body mass index is 28.35 kg/m.   Current Medications (verified) Outpatient Encounter Prescriptions as of 11/01/2015  Medication Sig  . aspirin 81 MG tablet Take 81 mg by mouth daily.  . fluticasone (FLONASE) 50 MCG/ACT nasal spray Place 2 sprays into the nose as needed.  Marland Kitchen lisinopril (PRINIVIL,ZESTRIL) 10 MG tablet TAKE 1 TABLET (10 MG TOTAL) BY MOUTH DAILY.  Marland Kitchen Omega-3 Fatty Acids (OMEGA 3 PO) Take 1 capsule by mouth daily.  Marland Kitchen senna (SENOKOT) 8.6 MG tablet Take 1 tablet by mouth daily.  . Sildenafil Citrate (VIAGRA PO) Take 1 tablet by mouth as needed.  Marland Kitchen ZETIA 10 MG tablet TAKE 1 TABLET (10 MG TOTAL) BY MOUTH DAILY.   No facility-administered encounter medications on file as of 11/01/2015.     Allergies (verified) Review of patient's allergies indicates no known allergies.   History: Past Medical History:  Diagnosis Date  . 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 CAD (coronary artery disease) 07/20/2013  . Hypercholesterolemia   . Hypertension   .  Ischemic heart disease    Prior angioplasty to the RCA with stenting of the LCX in 1998  . Osteoarthritis of right hip   . Prostate cancer [facility])    brachytherapy   Past Surgical History:  Procedure Laterality Date  . CORONARY ANGIOPLASTY WITH STENT PLACEMENT  06/21/1996   prior angioplasty of the RCA with stenting of the LCX in 1998  . EYE SURGERY  1969   Right eye /   . TONSILLECTOMY AND ADENOIDECTOMY     Family History  Problem Relation Age of Onset  . Cancer Father   . Heart failure Mother   . Heart attack Maternal Uncle 67  . Coronary artery disease Maternal Uncle 61    with CABG   Social History   Occupational History  . Editor, commissioning   Social History Main Topics  . Smoking status: Never Smoker  . Smokeless tobacco: Never Used  . Alcohol use 8.4 oz/week    14 Cans of beer per week     Comment: couple of beers a day. [employer].  . Drug use: No  . Sexual activity: Yes   Tobacco Counseling Counseling given: No   Activities of Daily Living In your present state of health, do you have any difficulty performing the following activities: 11/01/2015  Hearing? N  Vision? N  Difficulty concentrating or making decisions? N  Walking or  climbing stairs? N  Dressing or bathing? N  Doing errands, shopping? N  Preparing Food and eating ? N  Using the Toilet? N  In the past six months, have you accidently leaked urine? N  Do you have problems with loss of bowel control? N  Managing your Medications? N  Managing your Finances? N  Housekeeping or managing your Housekeeping? N  Some recent data might be hidden    Immunizations and Health Maintenance Immunization History  Administered Date(s) Administered  . Influenza, High Dose Seasonal PF 11/01/2015  . Influenza-Unspecified 02/06/2014  . Pneumococcal Conjugate-13 06/08/2013  . Pneumococcal Polysaccharide-23 01/27/2012  . Td 02/16/2006  . Zoster 12/09/2009   There are no preventive care reminders  to display for this patient.  Patient Care Team: Mackie Pai, PA-C as PCP - General (Physician Assistant) Peter M Martinique, MD as Consulting Physician (Cardiology) Milana Na, MD (Urology) Ralene Bathe, MD as Consulting Physician (Ophthalmology) Milana Na, MD as Consulting Physician (Urology)  Indicate any recent Medical Services you may have received from other than [facility] providers in the past year (date may be approximate).    Assessment:   This is a routine wellness examination for Ruben Henry. Physical assessment deferred to PCP.  Hearing/Vision screen Hearing Screening Comments: Able to hear conversational tones w/o difficulty. No issues reported. Vision Screening Comments: Follows w/ Dr. Claudean Kinds yearly and PRN. Wearing glasses. No issues reported, last eye exam last week.  Dietary issues and exercise activities discussed: Current Exercise Habits: Home exercise routine, Type of exercise: walking;stretching;strength training/weights, Time (Minutes): 30, Frequency (Times/Week): 3, Weekly Exercise (Minutes/Week): 90, Intensity: Moderate   Diet (meal preparation, eat out, water intake, caffeinated beverages, dairy products, fruits and vegetables): Meat and vegetables and usually salad. 3 meals daily, usually just coffee for breakfast. 2 cups of coffee. Drinks water throughout the day. Breakfast: coffee and danish or muffin Lunch/Dinner: Some variation of meat and vegetables w/ salad.     Goals    . Lose 10 lbs      Depression Screen PHQ 2/9 Scores 11/01/2015  PHQ - 2 Score 0    Fall Risk Fall Risk  11/01/2015  Falls in the past year? No    Cognitive Function: MMSE - Mini Mental State Exam 11/01/2015  Orientation to time 5  Orientation to Place 5  Registration 3  Attention/ Calculation 5  Recall 3  Language- name 2 objects 2  Language- repeat 1  Language- follow 3 step command 3  Language- read & follow direction 1  Write a sentence 1  Copy design  1  Total score 30    Screening Tests Health Maintenance  Topic Date Due  . TETANUS/TDAP  02/17/2016  . COLONOSCOPY  09/28/2016  . INFLUENZA VACCINE  Completed  . ZOSTAVAX  Completed  . Hepatitis C Screening  Completed  . PNA vac Low Risk Adult  Completed        Plan:   Bring a copy of your advanced directives to your next office visit.  Continue to eat heart healthy diet (full of fruits, vegetables, whole grains, lean protein, water--limit salt, fat, and sugar intake) and increase physical activity as tolerated.  Continue doing brain stimulating activities (puzzles, reading, adult coloring books, staying active) to keep memory sharp.   Flu shot today.  Follow-up with Mackie Pai, PA-C in 6 months.  During the course of the visit Ruben Henry was educated and counseled about the following appropriate screening and preventive services:   Vaccines to include  Pneumoccal, Influenza, Hepatitis B, Td, Zostavax, HCV  Colorectal cancer screening  Cardiovascular disease screening  Diabetes screening  Glaucoma screening  Nutrition counseling  Prostate cancer screening  Patient Instructions (the written plan) were given to the patient.   Dorrene German, RN   11/01/2015    I did review wellness and agree with assessment. On review of his chart looks like he has pending cmp and lipid panel to do. Will ask staff to remind him to get that done. Regarding his maintenance and preventative services on review of his chart appears had polyp on his last colonoscopy. So will ask pt when he was advised to repeat and who is was with since often repeat timeline is about 3 years.  Saguier, [name], PA-C

## 2015-11-12 NOTE — Telephone Encounter (Signed)
Will you remind pt to please get lipid panel and cmp test fasting. Saw due on review of his wellness exam. Our referral staff is going to call and try to get him set up for colonscopy since our chart records state polyp in 2013. Often follow up on polyps are every 3 years. Did he have colonoscopy done since 2013?

## 2015-11-14 ENCOUNTER — Telehealth: Payer: Self-pay | Admitting: Medical

## 2015-11-14 NOTE — Telephone Encounter (Signed)
Would you abstract that colnosocpy done 10-2013. That is what he told you. Thanks.

## 2015-11-14 NOTE — Telephone Encounter (Signed)
Patient had labs on 9/15. Has not has Colonoscopy since 2013.

## 2015-11-15 ENCOUNTER — Encounter: Payer: Self-pay | Admitting: Medical

## 2015-11-15 NOTE — Telephone Encounter (Signed)
Done

## 2016-03-16 DIAGNOSIS — M79671 Pain in right foot: Secondary | ICD-10-CM | POA: Diagnosis not present

## 2016-03-16 DIAGNOSIS — Z6826 Body mass index (BMI) 26.0-26.9, adult: Secondary | ICD-10-CM | POA: Diagnosis not present

## 2016-03-16 DIAGNOSIS — M21621 Bunionette of right foot: Secondary | ICD-10-CM | POA: Diagnosis not present

## 2016-03-31 ENCOUNTER — Telehealth: Payer: Self-pay | Admitting: Medical

## 2016-03-31 ENCOUNTER — Other Ambulatory Visit (INDEPENDENT_AMBULATORY_CARE_PROVIDER_SITE_OTHER): Payer: Medicare Other

## 2016-03-31 DIAGNOSIS — I10 Essential (primary) hypertension: Secondary | ICD-10-CM

## 2016-03-31 DIAGNOSIS — E785 Hyperlipidemia, unspecified: Secondary | ICD-10-CM

## 2016-03-31 DIAGNOSIS — C61 Malignant neoplasm of prostate: Secondary | ICD-10-CM | POA: Diagnosis not present

## 2016-03-31 LAB — COMPREHENSIVE METABOLIC PANEL
ALK PHOS: 59 U/L (ref 39–117)
ALT: 97 U/L — ABNORMAL HIGH (ref 0–53)
AST: 47 U/L — ABNORMAL HIGH (ref 0–37)
Albumin: 4.4 g/dL (ref 3.5–5.2)
BUN: 14 mg/dL (ref 6–23)
CO2: 26 meq/L (ref 19–32)
Calcium: 9.3 mg/dL (ref 8.4–10.5)
Chloride: 102 mEq/L (ref 96–112)
Creatinine, Ser: 0.77 mg/dL (ref 0.40–1.50)
GFR: 106.29 mL/min (ref >60.00)
GLUCOSE: 95 mg/dL (ref 70–99)
POTASSIUM: 3.7 meq/L (ref 3.5–5.1)
Sodium: 136 mEq/L (ref 135–145)
TOTAL PROTEIN: 7.3 g/dL (ref 6.0–8.3)
Total Bilirubin: 0.8 mg/dL (ref 0.2–1.2)

## 2016-03-31 LAB — LIPID PANEL
CHOL/HDL RATIO: 3
Cholesterol: 186 mg/dL (ref 0–200)
HDL: 66.5 mg/dL (ref >39.00)
LDL CALC: 103 mg/dL — AB (ref 0–99)
NONHDL: 119.22
Triglycerides: 82 mg/dL (ref 0.0–149.0)
VLDL: 16.4 mg/dL (ref 0.0–40.0)

## 2016-03-31 MED ORDER — EZETIMIBE 10 MG PO TABS
10.0000 mg | ORAL_TABLET | Freq: Every day | ORAL | 3 refills | Status: DC
Start: 2016-03-31 — End: 2016-05-04

## 2016-03-31 NOTE — Telephone Encounter (Signed)
rx zetia sent to pt pharmacy. 

## 2016-05-04 ENCOUNTER — Ambulatory Visit (INDEPENDENT_AMBULATORY_CARE_PROVIDER_SITE_OTHER): Payer: Medicare Other | Admitting: Medical

## 2016-05-04 ENCOUNTER — Encounter: Payer: Self-pay | Admitting: Medical

## 2016-05-04 ENCOUNTER — Other Ambulatory Visit: Payer: Self-pay | Admitting: Medical

## 2016-05-04 VITALS — BP 135/85 | HR 76 | Temp 98.1°F | Resp 16 | Ht 70.0 in | Wt 198.0 lb

## 2016-05-04 DIAGNOSIS — I2510 Atherosclerotic heart disease of native coronary artery without angina pectoris: Secondary | ICD-10-CM

## 2016-05-04 DIAGNOSIS — L989 Disorder of the skin and subcutaneous tissue, unspecified: Secondary | ICD-10-CM

## 2016-05-04 DIAGNOSIS — I10 Essential (primary) hypertension: Secondary | ICD-10-CM

## 2016-05-04 DIAGNOSIS — E785 Hyperlipidemia, unspecified: Secondary | ICD-10-CM | POA: Diagnosis not present

## 2016-05-04 DIAGNOSIS — Z8546 Personal history of malignant neoplasm of prostate: Secondary | ICD-10-CM

## 2016-05-04 DIAGNOSIS — Z23 Encounter for immunization: Secondary | ICD-10-CM

## 2016-05-04 MED ORDER — EZETIMIBE 10 MG PO TABS: 10.0000 mg | ORAL_TABLET | Freq: Every day | ORAL | 3 refills | Status: DC

## 2016-05-04 MED ORDER — EZETIMIBE 10 MG PO TABS: ORAL_TABLET | ORAL | 3 refills | Status: DC

## 2016-05-04 MED ORDER — LISINOPRIL 10 MG PO TABS: ORAL_TABLET | ORAL | 1 refills | Status: DC

## 2016-05-04 NOTE — Telephone Encounter (Signed)
Refill sent per [facility] refill protocol/SLS  

## 2016-05-04 NOTE — Patient Instructions (Addendum)
For your cholesterol which was well controlled continue current  meds.  For you blood pressure/well controlled will refill you lisinopril.  For you prostate cancer history continue to see your urologist.  For your CAD history will refer you back to your cardiologist.  Will refer you to dermatologist to survey your skin.  Follow up in 6 months or as needed  Get tdap today  Anderson Malta is referral coordinator in our office.

## 2016-05-04 NOTE — Telephone Encounter (Signed)
Relation to QP:EAKL Call back [phone] Pharmacy: [place], [place] [phone] (Phone) [phone] (Fax)     Reason for call:  Patient requesting a refill lisinopril (PRINIVIL,ZESTRIL) 10 MG tablet

## 2016-05-04 NOTE — Progress Notes (Signed)
Subjective:    Patient ID: Ruben Henry, male    DOB: Aug 06, 1945, 71 y.o.   MRN: [id number]  HPI   Pt in for follow up.  Pt hyperlipidemia- Pt is on zetia and fish oil. Pt is getting some exercise. Working 2-4 days a week. 4-5 miles walking a day when he works out.  Hx of htn- Bp is fairly well controlled.  Hx of prostate cancer- Pt sees urologist every 6 months. Pt last psa was .01 on feb 2013.  Hx of allergies- Pt states allergies are controlled.  CAD- Pt used to see cardiologist. It looks like 2 years since he last saw cardiologist.  Needs tdap- pt is ok with gettting tdap today.   Review of Systems  Constitutional: Negative for chills, fatigue and fever.  HENT: Negative for congestion, ear discharge, facial swelling, nosebleeds and postnasal drip.   Respiratory: Negative for cough, chest tightness, shortness of breath and wheezing.   Cardiovascular: Negative for chest pain and palpitations.  Gastrointestinal: Negative for abdominal pain and anal bleeding.  Genitourinary: Negative for dysuria, flank pain, frequency, hematuria and penile pain.  Musculoskeletal: Negative for back pain, myalgias and neck stiffness.  Skin: Negative for pallor and rash.  Neurological: Negative for dizziness, weakness, light-headedness, numbness and headaches.  Hematological: Negative for adenopathy. Does not bruise/bleed easily.  Psychiatric/Behavioral: Negative for behavioral problems, confusion, self-injury, sleep disturbance and suicidal ideas. The patient is not nervous/anxious.     Past Medical History:  Diagnosis Date  . 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 CAD (coronary artery disease) 07/20/2013  . Hypercholesterolemia   . Hypertension   . Ischemic heart disease    Prior angioplasty to the RCA with stenting of the LCX in 1998  . Osteoarthritis of right hip   . Prostate cancer [facility])    brachytherapy     Social History   Social History  . Marital status: Married    Spouse name: N/A  . Number of children: 2  . Years of education: N/A   Occupational History  . Editor, commissioning   Social History Main Topics  . Smoking status: Never Smoker  . Smokeless tobacco: Never Used  . Alcohol use 8.4 oz/week    14 Cans of beer per week     Comment: couple of beers a day. [employer].  . Drug use: No  . Sexual activity: Yes   Other Topics Concern  . Not on file   Social History Narrative  . No narrative on file    Past Surgical History:  Procedure Laterality Date  . CORONARY ANGIOPLASTY WITH STENT PLACEMENT  06/21/1996   prior angioplasty of the RCA with stenting of the LCX in 1998  . EYE SURGERY  1969   Right eye /   . TONSILLECTOMY AND ADENOIDECTOMY      Family History  Problem Relation Age of Onset  . Cancer Father   . Heart failure Mother   . Heart attack Maternal Uncle 67  . Coronary artery disease Maternal Uncle 61    with CABG    No Known Allergies  Current Outpatient Prescriptions on File Prior to Visit  Medication Sig Dispense Refill  . aspirin 81 MG tablet Take 81 mg by mouth daily.    Marland Kitchen ezetimibe (ZETIA) 10 MG tablet TAKE 1 TABLET (10 MG TOTAL) BY MOUTH DAILY. 90 tablet 1  . fluticasone (FLONASE) 50 MCG/ACT nasal spray Place 2 sprays into the nose  as needed.    Marland Kitchen lisinopril (PRINIVIL,ZESTRIL) 10 MG tablet TAKE 1 TABLET (10 MG TOTAL) BY MOUTH DAILY. 90 tablet 1  . Omega-3 Fatty Acids (OMEGA 3 PO) Take 1 capsule by mouth daily.    Marland Kitchen senna (SENOKOT) 8.6 MG tablet Take 1 tablet by mouth daily.    . Sildenafil Citrate (VIAGRA PO) Take 1 tablet by mouth as needed.     No current facility-administered medications on file prior to visit.     BP 135/85 (BP Location: Right Arm, Patient Position: Sitting, Cuff Size: Large)   Pulse 76   Temp 98.1 F (36.7 C) (Oral)   Resp 16   Ht 5\' 10"  (1.778 m)   Wt 198 lb (89.8 kg)   SpO2 99%   BMI 28.41 kg/m       Objective:   Physical Exam  General Mental Status- Alert.  General Appearance- Not in acute distress.   Skin General:scattered small moles. One moderate sized mid thoracic area  8 mm. Slight irregular on edges. Other on rt lower back as well.  Neck Carotid Arteries- Normal color. Moisture- Normal Moisture. No carotid bruits. No JVD.  Chest and Lung Exam Auscultation: Breath Sounds:-Normal.  Cardiovascular Auscultation:Rythm- Regular. Murmurs & Other Heart Sounds:Auscultation of the heart reveals- No Murmurs.  Abdomen Inspection:-Inspeection Normal. Palpation/Percussion:Note:No mass. Palpation and Percussion of the abdomen reveal- Non Tender, Non Distended + BS, no rebound or guarding.   Neurologic Cranial Nerve exam:- CN III-XII intact(No nystagmus), symmetric smile. Strength:- 5/5 equal and symmetric strength both upper and lower extremities.      Assessment & Plan:  For your cholesterol which was well controlled continue current meds.  For you blood pressure/well controlled will refill you lisinopril.  For you prostate cancer history continue to see your urologist.  For your CAD history will refer you back to your cardiologist.  Will refer you to dermatologist to survey your skin.  Follow up in 6 months or as needed  Get tdap today  Dean Wonder, [name], [place]

## 2016-05-04 NOTE — Progress Notes (Signed)
Pre visit review using our clinic review tool, if applicable. No additional management support is needed unless otherwise documented below in the visit note/SLS  

## 2016-06-01 DIAGNOSIS — L814 Other melanin hyperpigmentation: Secondary | ICD-10-CM | POA: Diagnosis not present

## 2016-06-01 DIAGNOSIS — Z1283 Encounter for screening for malignant neoplasm of skin: Secondary | ICD-10-CM | POA: Diagnosis not present

## 2016-06-01 DIAGNOSIS — L989 Disorder of the skin and subcutaneous tissue, unspecified: Secondary | ICD-10-CM | POA: Diagnosis not present

## 2016-06-01 DIAGNOSIS — L821 Other seborrheic keratosis: Secondary | ICD-10-CM | POA: Diagnosis not present

## 2016-06-19 ENCOUNTER — Ambulatory Visit: Payer: Medicare Other | Admitting: Cardiology

## 2016-06-25 ENCOUNTER — Encounter: Payer: Self-pay | Admitting: *Deleted

## 2016-07-17 NOTE — Progress Notes (Signed)
[name] Date of Birth: 1945-11-30 Medical Record #[id number]  History of Present Illness: Ruben Henry is seen back today for a follow up visit. Last seen in June 2015.   Has known CAD with remote angioplasty of the RCA and stenting of the LCX with an AVE stent in 1998. 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 He had an ETT in August 2015 that was negative for ischemia. He also has a history of elevated transaminases. He had an abdominal US in August 2015 showing diffuse hepatic steatosis/hepatocellular disease. Followed by Dr. Harlene Ramus and no further work up recommended.  On follow up today he reports he is feeling well.  [facility]. He  denies any chest pain or SOB. No edema or palpitations. 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  Current Outpatient Prescriptions on File Prior to Visit  Medication Sig Dispense Refill  . aspirin 81 MG tablet Take 81 mg by mouth daily.    Marland Kitchen ezetimibe (ZETIA) 10 MG tablet TAKE 1 TABLET (10 MG TOTAL) BY MOUTH DAILY. 90 tablet 3  . fluticasone (FLONASE) 50 MCG/ACT nasal spray Place 2 sprays into the nose as needed.    Marland Kitchen lisinopril (PRINIVIL,ZESTRIL) 10 MG tablet TAKE 1 TABLET (10 MG TOTAL) BY MOUTH DAILY. 90 tablet 1  . Omega-3 Fatty Acids (OMEGA 3 PO) Take 1 capsule by mouth daily.    Marland Kitchen senna (SENOKOT) 8.6 MG tablet Take 1 tablet by mouth daily.    . Sildenafil Citrate (VIAGRA PO) Take 1 tablet by mouth as needed.     No current facility-administered medications on file prior to visit.     No Known Allergies  Past Medical History:  Diagnosis Date  . 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 CAD (coronary artery disease) 07/20/2013  . Hypercholesterolemia   . Hypertension   . Ischemic heart disease    Prior angioplasty to the RCA with stenting of the LCX in 1998  . Osteoarthritis of right hip   . Prostate cancer [facility])    brachytherapy    Past Surgical History:  Procedure Laterality Date  . CORONARY ANGIOPLASTY WITH STENT PLACEMENT  06/21/1996   prior angioplasty of the RCA with stenting of the LCX in 1998  . EYE SURGERY  1969   Right eye /   . TONSILLECTOMY AND ADENOIDECTOMY      History  Smoking Status  . Never Smoker  Smokeless Tobacco  . Never Used    History  Alcohol Use  . 8.4 oz/week  . 14 Cans of beer per week    Comment: couple of beers a day. [employer].    Family History  Problem Relation Age of Onset  . Cancer Father   . Heart failure Mother   . Heart attack Maternal Uncle 67  . Coronary artery disease Maternal Uncle 61       with CABG    Review of Systems: The review of systems is per the HPI.  All other systems were reviewed and are negative.  Physical Exam: BP 135/82   Pulse 68   Ht 5\' 10"  (1.778 m)   Wt 198 lb 12.8 oz (90.2 kg)   BMI 28.52 kg/m  Patient is very pleasant and in no acute distress. Skin is warm and dry. Color is normal.  HEENT is unremarkable. Normocephalic/atraumatic. PERRL. Sclera are nonicteric. Neck is supple.  No masses. No JVD. Lungs are clear. Cardiac exam shows a regular rate and rhythm. Normal S1-2. no gallop or murmur. Abdomen is soft. Extremities are without edema. Gait and ROM are intact. No gross neurologic deficits noted.   LABORATORY DATA:   Lab Results  Component Value Date   WBC 6.9 01/29/2007   HGB 9.8 (L) 01/29/2007   HCT 27.6 (L) 01/29/2007   PLT 205 01/29/2007   GLUCOSE 95 03/31/2016   CHOL 186 03/31/2016   TRIG 82.0 03/31/2016   HDL 66.50 03/31/2016   LDLDIRECT 106.6 03/24/2012   LDLCALC 103 (H) 03/31/2016   ALT 97 (H) 03/31/2016   AST 47 (H) 03/31/2016   NA 136 03/31/2016   K 3.7 03/31/2016   CL 102 03/31/2016   CREATININE 0.77 03/31/2016   BUN 14 03/31/2016   CO2 26 03/31/2016   INR 0.9 01/24/2007    Ecg: today- NSR rate 68 bpm. Normal. I have personally reviewed and interpreted this study.   Assessment /  Plan:  1. CAD/ Ischemia heart disease - s/p PTCA of the RCA and stenting of the LCx with DES 1998. Doing well clinically. No symptoms. ETT August 2015 negative for ischemia. Will update ETT at this time.   2. HTN - blood pressure is well controlled.  3. HLD - on Zetia. Intolerant of statins and red yeast rice due to elevated LFTs. LDL not ideal at 103. Discussed newer lipid lowering agents but given cost will hold off for now.

## 2016-07-20 ENCOUNTER — Encounter: Payer: Self-pay | Admitting: Cardiology

## 2016-07-20 ENCOUNTER — Ambulatory Visit (INDEPENDENT_AMBULATORY_CARE_PROVIDER_SITE_OTHER): Payer: Medicare Other | Admitting: Cardiology

## 2016-07-20 VITALS — BP 135/82 | HR 68 | Ht 70.0 in | Wt 198.8 lb

## 2016-07-20 DIAGNOSIS — I10 Essential (primary) hypertension: Secondary | ICD-10-CM

## 2016-07-20 DIAGNOSIS — I2510 Atherosclerotic heart disease of native coronary artery without angina pectoris: Secondary | ICD-10-CM | POA: Diagnosis not present

## 2016-07-20 DIAGNOSIS — E785 Hyperlipidemia, unspecified: Secondary | ICD-10-CM | POA: Diagnosis not present

## 2016-07-20 NOTE — Patient Instructions (Signed)
We will schedule you for a stress test  Continue your current therapy

## 2016-08-12 ENCOUNTER — Other Ambulatory Visit: Payer: Self-pay | Admitting: Medical

## 2016-08-12 DIAGNOSIS — I10 Essential (primary) hypertension: Secondary | ICD-10-CM

## 2016-08-20 ENCOUNTER — Telehealth (HOSPITAL_COMMUNITY): Payer: Self-pay

## 2016-08-20 NOTE — Telephone Encounter (Signed)
Encounter complete. 

## 2016-08-25 ENCOUNTER — Ambulatory Visit (HOSPITAL_COMMUNITY)
Admission: RE | Admit: 2016-08-25 | Discharge: 2016-08-25 | Disposition: A | Discharge status: Home / Self Care | Payer: Medicare Other | Source: Ambulatory Visit | Attending: Cardiovascular Disease | Admitting: Cardiovascular Disease

## 2016-08-25 ENCOUNTER — Encounter (HOSPITAL_COMMUNITY): Payer: Self-pay | Admitting: *Deleted

## 2016-08-25 DIAGNOSIS — I10 Essential (primary) hypertension: Secondary | ICD-10-CM | POA: Diagnosis not present

## 2016-08-25 DIAGNOSIS — R9439 Abnormal result of other cardiovascular function study: Secondary | ICD-10-CM | POA: Insufficient documentation

## 2016-08-25 DIAGNOSIS — E785 Hyperlipidemia, unspecified: Secondary | ICD-10-CM | POA: Diagnosis not present

## 2016-08-25 DIAGNOSIS — I2510 Atherosclerotic heart disease of native coronary artery without angina pectoris: Secondary | ICD-10-CM | POA: Diagnosis not present

## 2016-08-25 LAB — EXERCISE TOLERANCE TEST
CHL RATE OF PERCEIVED EXERTION: 18
CSEPEW: 10.1 METS
Exercise duration (min): 9 min
Exercise duration (sec): 0 s
MPHR: 151 {beats}/min
Peak HR: 169 {beats}/min
Percent HR: 111 %
Rest HR: 65 {beats}/min

## 2016-08-25 NOTE — Progress Notes (Unsigned)
Abnormal ETT was reviewed by Dr. Claiborne Billings. Patient was given the okay to be discharged and go home. Patient was also advised to follow up with Dr. Martinique.

## 2016-08-27 ENCOUNTER — Telehealth: Payer: Self-pay | Admitting: *Deleted

## 2016-08-27 DIAGNOSIS — I2510 Atherosclerotic heart disease of native coronary artery without angina pectoris: Secondary | ICD-10-CM

## 2016-08-27 DIAGNOSIS — R9439 Abnormal result of other cardiovascular function study: Secondary | ICD-10-CM

## 2016-08-27 NOTE — Telephone Encounter (Signed)
-----   Message from Peter M Martinique, MD sent at 08/25/2016  4:40 PM EDT ----- ETT shows good exercise tolerance- stable from 2015. ST changes are more prominent compared to 2015 but may be nonspecific due to hypertensive BP response. I would recommend a stress Myoview study to evaluate changes further.  Peter Martinique MD, [facility]

## 2016-08-27 NOTE — Telephone Encounter (Signed)
Released in my chart with Dr Doug Sou comments attached Unable to reach via phone  Left message to call back

## 2016-08-28 NOTE — Telephone Encounter (Signed)
Follow Up Call:  Returning a Call . Thanks

## 2016-08-28 NOTE — Telephone Encounter (Signed)
Patient called back with results. The order for the Stress Myoview has already been placed. Will inform the scheduling pool so that he may have an appointment made.

## 2016-09-04 ENCOUNTER — Telehealth (HOSPITAL_COMMUNITY): Payer: Self-pay

## 2016-09-04 NOTE — Telephone Encounter (Signed)
Encounter complete. 

## 2016-09-09 ENCOUNTER — Ambulatory Visit (HOSPITAL_COMMUNITY)
Admission: RE | Admit: 2016-09-09 | Discharge: 2016-09-09 | Disposition: A | Discharge status: Home / Self Care | Payer: Medicare Other | Source: Ambulatory Visit | Attending: Cardiovascular Disease | Admitting: Cardiovascular Disease

## 2016-09-09 DIAGNOSIS — I2510 Atherosclerotic heart disease of native coronary artery without angina pectoris: Secondary | ICD-10-CM | POA: Diagnosis not present

## 2016-09-09 DIAGNOSIS — R9439 Abnormal result of other cardiovascular function study: Secondary | ICD-10-CM | POA: Diagnosis not present

## 2016-09-09 LAB — MYOCARDIAL PERFUSION IMAGING
CHL CUP NUCLEAR SSS: 2
CHL RATE OF PERCEIVED EXERTION: 17
CSEPEW: 10.1 METS
CSEPPHR: 155 {beats}/min
Exercise duration (min): 8 min
Exercise duration (sec): 0 s
LVDIAVOL: 94 mL (ref 62–150)
LVSYSVOL: 40 mL
MPHR: 151 {beats}/min
NUC STRESS TID: 1.01
Percent HR: 102 %
Rest HR: 60 {beats}/min
SDS: 0
SRS: 2

## 2016-09-09 MED ORDER — TECHNETIUM TC 99M TETROFOSMIN IV KIT
11.0000 | PACK | Freq: Once | INTRAVENOUS | Status: AC | PRN
  Administered 2016-09-09: 11 via INTRAVENOUS
  Filled 2016-09-09: qty 11

## 2016-09-09 MED ORDER — TECHNETIUM TC 99M TETROFOSMIN IV KIT
31.7000 | PACK | Freq: Once | INTRAVENOUS | Status: AC | PRN
  Administered 2016-09-09: 31.7 via INTRAVENOUS
  Filled 2016-09-09: qty 32

## 2016-12-01 DIAGNOSIS — C61 Malignant neoplasm of prostate: Secondary | ICD-10-CM | POA: Diagnosis not present

## 2016-12-29 DIAGNOSIS — Z86010 Personal history of colonic polyps: Secondary | ICD-10-CM | POA: Diagnosis not present

## 2017-01-30 DIAGNOSIS — Z23 Encounter for immunization: Secondary | ICD-10-CM | POA: Diagnosis not present

## 2017-05-10 ENCOUNTER — Other Ambulatory Visit: Payer: Self-pay | Admitting: *Deleted

## 2017-05-10 ENCOUNTER — Telehealth: Payer: Self-pay | Admitting: Medical

## 2017-05-10 DIAGNOSIS — I10 Essential (primary) hypertension: Secondary | ICD-10-CM

## 2017-05-10 DIAGNOSIS — Z23 Encounter for immunization: Secondary | ICD-10-CM

## 2017-05-10 MED ORDER — LISINOPRIL 10 MG PO TABS: 10.0000 mg | ORAL_TABLET | Freq: Every day | ORAL | 0 refills | Status: DC

## 2017-05-10 MED ORDER — EZETIMIBE 10 MG PO TABS: ORAL_TABLET | ORAL | 0 refills | Status: DC

## 2017-05-10 NOTE — Telephone Encounter (Signed)
Copied from [place] [id number]. Topic: General - Other >> May 10, 2017 10:01 AM Darl Householder, RMA wrote: Reason for CRM: Medication refill request for ezetimibe (ZETIA) 10 MG tablet and lisinopril (PRINIVIL,ZESTRIL) 10 MG to be sent to [facility] [place]

## 2017-06-01 ENCOUNTER — Ambulatory Visit: Payer: Medicare Other | Admitting: *Deleted

## 2017-06-03 NOTE — Progress Notes (Deleted)
Subjective:   Ruben Henry is a 72 y.o. male who presents for Medicare Annual/Subsequent preventive examination.  Review of Systems: No ROS.  Medicare Wellness Visit. Additional risk factors are reflected in the social history.    Sleep patterns:  Home Safety/Smoke Alarms: Feels safe in home. Smoke alarms in place.  Living environment; residence and Firearm Safety:   Male:   CCS-     PSA- No results found for: PSA      Objective:    Vitals: There were no vitals taken for this visit.  There is no height or weight on file to calculate BMI.  Advanced Directives 11/01/2015  Does Patient Have a Medical Advance Directive? Yes  Type of Advance Directive Living will  Copy of [place] in Chart? No - copy requested    Tobacco Social History   Tobacco Use  Smoking Status Never Smoker  Smokeless Tobacco Never Used     Counseling given: Not Answered   Clinical Intake:                       Past Medical History:  Diagnosis Date  . 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 CAD (coronary artery disease) 07/20/2013  . Hypercholesterolemia   . Hypertension   . Ischemic heart disease    Prior angioplasty to the RCA with stenting of the LCX in 1998  . Osteoarthritis of right hip   . Prostate cancer [facility])    brachytherapy   Past Surgical History:  Procedure Laterality Date  . CORONARY ANGIOPLASTY WITH STENT PLACEMENT  06/21/1996   prior angioplasty of the RCA with stenting of the LCX in 1998  . EYE SURGERY  1969   Right eye /   . TONSILLECTOMY AND ADENOIDECTOMY     Family History  Problem Relation Age of Onset  . Cancer Father   . Heart failure Mother   . Heart attack Maternal Uncle 67  . Coronary artery disease Maternal Uncle 61       with CABG   Social History   Socioeconomic History  . Marital status: Married    Spouse name: Not on file  . Number of children: 2  . Years of education: Not on file  . Highest education  level: Not on file  Occupational History  . Occupation: Lobbyist: [employer]  . Financial resource strain: Not on file  . Food insecurity:    Worry: Not on file    Inability: Not on file  . Transportation needs:    Medical: Not on file    Non-medical: Not on file  Tobacco Use  . Smoking status: Never Smoker  . Smokeless tobacco: Never Used  Substance and Sexual Activity  . Alcohol use: Yes    Alcohol/week: 8.4 oz    Types: 14 Cans of beer per week    Comment: couple of beers a day. [employer].  . Drug use: No  . Sexual activity: Yes  Lifestyle  . Physical activity:    Days per week: Not on file    Minutes per session: Not on file  . Stress: Not on file  Relationships  . Social connections:    Talks on phone: Not on file    Gets together: Not on file    Attends religious service: Not on file    Active member of club or organization: Not on file  Attends meetings of clubs or organizations: Not on file    Relationship status: Not on file  Other Topics Concern  . Not on file  Social History Narrative  . Not on file    Outpatient Encounter Medications as of 06/08/2017  Medication Sig  . aspirin 81 MG tablet Take 81 mg by mouth daily.  Marland Kitchen ezetimibe (ZETIA) 10 MG tablet TAKE 1 TABLET (10 MG TOTAL) BY MOUTH DAILY.  . fluticasone (FLONASE) 50 MCG/ACT nasal spray Place 2 sprays into the nose as needed.  Marland Kitchen lisinopril (PRINIVIL,ZESTRIL) 10 MG tablet Take 1 tablet (10 mg total) by mouth daily.  . Omega-3 Fatty Acids (OMEGA 3 PO) Take 1 capsule by mouth daily.  Marland Kitchen senna (SENOKOT) 8.6 MG tablet Take 1 tablet by mouth daily.  . Sildenafil Citrate (VIAGRA PO) Take 1 tablet by mouth as needed.   No facility-administered encounter medications on file as of 06/08/2017.     Activities of Daily Living No flowsheet data found.  Patient Care Team: Saguier, Iris Pert as PCP - General (Physician Assistant) Martinique, Peter M, MD as Consulting  Physician (Cardiology) Puschinsky, Fransico Him., MD (Urology) Ralene Bathe, MD as Consulting Physician (Ophthalmology) Puschinsky, Fransico Him., MD as Consulting Physician (Urology)   Assessment:   This is a routine wellness examination for Ruben Henry.  Exercise Activities and Dietary recommendations    Goals    None      Fall Risk Fall Risk  11/01/2015  Falls in the past year? No   Is the patient's home free of loose throw rugs in walkways, pet beds, electrical cords, etc?   {Blank single:19197::"yes","no"}      Grab bars in the bathroom? {Blank single:19197::"yes","no"}      Handrails on the stairs?   {Blank single:19197::"yes","no"}      Adequate lighting?   {Blank single:19197::"yes","no"}  Timed Get Up and Go Performed: ***  Depression Screen PHQ 2/9 Scores 11/01/2015  PHQ - 2 Score 0    Cognitive Function MMSE - Mini Mental State Exam 11/01/2015  Orientation to time 5  Orientation to Place 5  Registration 3  Attention/ Calculation 5  Recall 3  Language- name 2 objects 2  Language- repeat 1  Language- follow 3 step command 3  Language- read & follow direction 1  Write a sentence 1  Copy design 1  Total score 30        Immunization History  Administered Date(s) Administered  . Influenza, High Dose Seasonal PF 11/01/2015  . Influenza-Unspecified 02/06/2014  . Pneumococcal Conjugate-13 06/08/2013  . Pneumococcal Polysaccharide-23 01/27/2012  . Td 02/16/2006  . Tdap 05/04/2016  . Zoster 12/09/2009    Qualifies for Shingles Vaccine? ***  Screening Tests Health Maintenance  Topic Date Due  . INFLUENZA VACCINE  09/16/2017  . COLONOSCOPY  12/29/2021  . TETANUS/TDAP  05/05/2026  . Hepatitis C Screening  Completed  . PNA vac Low Risk Adult  Completed   Cancer Screenings: Lung: Low Dose CT Chest recommended if Age 52-80 years, 30 pack-year currently smoking OR have quit w/in 15years. Patient {DOES NOT does:27190::"does not"} qualify. Colorectal:  ***  Additional Screenings: *** Hepatitis C Screening:      Plan:   ***  I have personally reviewed and noted the following in the patient's chart:   . Medical and social history . Use of alcohol, tobacco or illicit drugs  . Current medications and supplements . Functional ability and status . Nutritional status . Physical activity . Advanced directives . List of  other physicians . Hospitalizations, surgeries, and ER visits in previous 12 months . Vitals . Screenings to include cognitive, depression, and falls . Referrals and appointments  In addition, I have reviewed and discussed with patient certain preventive protocols, quality metrics, and best practice recommendations. A written personalized care plan for preventive services as well as general preventive health recommendations were provided to patient.     Naaman Plummer [place], [place]  06/03/2017

## 2017-06-08 ENCOUNTER — Ambulatory Visit: Payer: Medicare Other | Admitting: *Deleted

## 2017-06-09 NOTE — Progress Notes (Addendum)
Subjective:   Ruben Henry is a 72 y.o. male who presents for Medicare Annual/Subsequent preventive examination. Pt still works full time.  Enjoys golfing often.   Review of Systems: No ROS.  Medicare Wellness Visit. Additional risk factors are reflected in the social history.  Cardiac Risk Factors include: advanced age (>30men, >78 women);dyslipidemia;hypertension;male gender Sleep patterns:  Sleep 7 hrs. Feels rested.  Home Safety/Smoke Alarms: Feels safe in home. Smoke alarms in place.  Living environment; residence and Firearm Safety:    Male:   CCS-   2018  PSA- No results found for: PSA      Objective:    Vitals: BP 136/68 (BP Location: Left Arm, Patient Position: Sitting, Cuff Size: Normal)   Pulse 71   Ht 5\' 10"  (1.778 m)   Wt 198 lb 6.4 oz (90 kg)   SpO2 97%   BMI 28.47 kg/m   Body mass index is 28.47 kg/m.  Advanced Directives 06/11/2017 11/01/2015  Does Patient Have a Medical Advance Directive? No Yes  Type of Advance Directive - Living will  Copy of [place] in Chart? - No - copy requested  Would patient like information on creating a medical advance directive? No - Patient declined -    Tobacco Social History   Tobacco Use  Smoking Status Never Smoker  Smokeless Tobacco Never Used     Counseling given: Not Answered   Clinical Intake: Pain : No/denies pain      Past Medical History:  Diagnosis Date  . 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 CAD (coronary artery disease) 07/20/2013  . Hypercholesterolemia   . Hypertension   . Ischemic heart disease    Prior angioplasty to the RCA with stenting of the LCX in 1998  . Osteoarthritis of right hip   . Prostate cancer [facility])    brachytherapy   Past Surgical History:  Procedure Laterality Date  . CORONARY ANGIOPLASTY WITH STENT PLACEMENT  06/21/1996   prior angioplasty of the RCA with stenting of the LCX in 1998  . EYE SURGERY  1969   Right eye /   .  TONSILLECTOMY AND ADENOIDECTOMY     Family History  Problem Relation Age of Onset  . Cancer Father   . Heart failure Mother   . Diabetes Sister   . Heart attack Maternal Uncle 67  . Coronary artery disease Maternal Uncle 61       with CABG   Social History   Socioeconomic History  . Marital status: Married    Spouse name: Not on file  . Number of children: 2  . Years of education: Not on file  . Highest education level: Not on file  Occupational History  . Occupation: Lobbyist: [employer]  . Financial resource strain: Not on file  . Food insecurity:    Worry: Not on file    Inability: Not on file  . Transportation needs:    Medical: Not on file    Non-medical: Not on file  Tobacco Use  . Smoking status: Never Smoker  . Smokeless tobacco: Never Used  Substance and Sexual Activity  . Alcohol use: Yes    Alcohol/week: 8.4 oz    Types: 14 Cans of beer per week    Comment: couple of beers a day. Coors Light.  . Drug use: No  . Sexual activity: Yes  Lifestyle  . Physical activity:  Days per week: Not on file    Minutes per session: Not on file  . Stress: Not on file  Relationships  . Social connections:    Talks on phone: Not on file    Gets together: Not on file    Attends religious service: Not on file    Active member of club or organization: Not on file    Attends meetings of clubs or organizations: Not on file    Relationship status: Not on file  Other Topics Concern  . Not on file  Social History Narrative  . Not on file    Outpatient Encounter Medications as of 06/11/2017  Medication Sig  . aspirin 81 MG tablet Take 81 mg by mouth daily.  Marland Kitchen ezetimibe (ZETIA) 10 MG tablet TAKE 1 TABLET (10 MG TOTAL) BY MOUTH DAILY.  . fluticasone (FLONASE) 50 MCG/ACT nasal spray Place 2 sprays into the nose as needed.  Marland Kitchen lisinopril (PRINIVIL,ZESTRIL) 10 MG tablet Take 1 tablet (10 mg total) by mouth daily.  . Omega-3 Fatty Acids  (OMEGA 3 PO) Take 1 capsule by mouth daily.  Marland Kitchen senna (SENOKOT) 8.6 MG tablet Take 1 tablet by mouth daily.  . Sildenafil Citrate (VIAGRA PO) Take 1 tablet by mouth as needed.   No facility-administered encounter medications on file as of 06/11/2017.     Activities of Daily Living In your present state of health, do you have any difficulty performing the following activities: 06/11/2017  Hearing? N  Vision? N  Comment wears glasses.   Difficulty concentrating or making decisions? N  Walking or climbing stairs? N  Dressing or bathing? N  Doing errands, shopping? N  Preparing Food and eating ? N  Using the Toilet? N  In the past six months, have you accidently leaked urine? N  Do you have problems with loss of bowel control? N  Managing your Medications? N  Managing your Finances? N  Housekeeping or managing your Housekeeping? N  Some recent data might be hidden    Patient Care Team: Saguier, Iris Pert as PCP - General (Physician Assistant) Martinique, Peter M, MD as Consulting Physician (Cardiology) Ralene Bathe, MD as Consulting Physician (Ophthalmology) Puschinsky, Fransico Him., MD as Consulting Physician (Urology)   Assessment:   This is a routine wellness examination for [name]. Physical assessment deferred to PCP.  Exercise Activities and Dietary recommendations Current Exercise Habits: Structured exercise class, Type of exercise: strength training/weights;treadmill(8000 steps per day), Time (Minutes): 60, Frequency (Times/Week): 3, Weekly Exercise (Minutes/Week): 180 Diet (meal preparation, eat out, water intake, caffeinated beverages, dairy products, fruits and vegetables): in general, a "healthy" diet  , well balanced. States wife is retired Microbiologist. Tries to drink enough water.    Goals    . Weight (lb) < 190 lb (86.2 kg)       Fall Risk Fall Risk  06/11/2017 11/01/2015  Falls in the past year? No No    Depression Screen PHQ 2/9 Scores 06/11/2017 11/01/2015  PHQ  - 2 Score 0 0    Cognitive Function MMSE - Mini Mental State Exam 11/01/2015  Orientation to time 5  Orientation to Place 5  Registration 3  Attention/ Calculation 5  Recall 3  Language- name 2 objects 2  Language- repeat 1  Language- follow 3 step command 3  Language- read & follow direction 1  Write a sentence 1  Copy design 1  Total score 30        Immunization History  Administered Date(s) Administered  .  Influenza, High Dose Seasonal PF 11/01/2015  . Influenza-Unspecified 02/06/2014  . Pneumococcal Conjugate-13 06/08/2013  . Pneumococcal Polysaccharide-23 01/27/2012  . Td 02/16/2006  . Tdap 05/04/2016  . Zoster 12/09/2009    Screening Tests Health Maintenance  Topic Date Due  . INFLUENZA VACCINE  09/16/2017  . COLONOSCOPY  12/29/2021  . TETANUS/TDAP  05/05/2026  . Hepatitis C Screening  Completed  . PNA vac Low Risk Adult  Completed       Plan:   Follow up with [name] PA today as scheduled.  Continue to eat heart healthy diet (full of fruits, vegetables, whole grains, lean protein, water--limit salt, fat, and sugar intake) and increase physical activity as tolerated.     I have personally reviewed and noted the following in the patient's chart:   . Medical and social history . Use of alcohol, tobacco or illicit drugs  . Current medications and supplements . Functional ability and status . Nutritional status . Physical activity . Advanced directives . List of other physicians . Hospitalizations, surgeries, and ER visits in previous 12 months . Vitals . Screenings to include cognitive, depression, and falls . Referrals and appointments  In addition, I have reviewed and discussed with patient certain preventive protocols, quality metrics, and best practice recommendations. A written personalized care plan for preventive services as well as general preventive health recommendations were provided to patient.     Shela Nevin, [place]  06/11/2017     Reviewed and agree with assessment and plan of RN.  Mackie Pai, PA-C

## 2017-06-11 ENCOUNTER — Encounter: Payer: Self-pay | Admitting: *Deleted

## 2017-06-11 ENCOUNTER — Ambulatory Visit (INDEPENDENT_AMBULATORY_CARE_PROVIDER_SITE_OTHER): Payer: Medicare Other | Admitting: *Deleted

## 2017-06-11 ENCOUNTER — Encounter: Payer: Self-pay | Admitting: Medical

## 2017-06-11 ENCOUNTER — Ambulatory Visit (INDEPENDENT_AMBULATORY_CARE_PROVIDER_SITE_OTHER): Payer: Medicare Other | Admitting: Medical

## 2017-06-11 VITALS — BP 136/68 | HR 71 | Ht 70.0 in | Wt 198.4 lb

## 2017-06-11 VITALS — BP 136/68 | HR 71 | Ht 70.0 in | Wt 198.0 lb

## 2017-06-11 DIAGNOSIS — J301 Allergic rhinitis due to pollen: Secondary | ICD-10-CM | POA: Diagnosis not present

## 2017-06-11 DIAGNOSIS — Z23 Encounter for immunization: Secondary | ICD-10-CM | POA: Diagnosis not present

## 2017-06-11 DIAGNOSIS — I10 Essential (primary) hypertension: Secondary | ICD-10-CM | POA: Diagnosis not present

## 2017-06-11 DIAGNOSIS — E785 Hyperlipidemia, unspecified: Secondary | ICD-10-CM

## 2017-06-11 DIAGNOSIS — Z0000 Encounter for general adult medical examination without abnormal findings: Secondary | ICD-10-CM

## 2017-06-11 MED ORDER — LISINOPRIL 10 MG PO TABS: 10.0000 mg | ORAL_TABLET | Freq: Every day | ORAL | 3 refills | Status: DC

## 2017-06-11 MED ORDER — EZETIMIBE 10 MG PO TABS: ORAL_TABLET | ORAL | 3 refills | Status: DC

## 2017-06-11 NOTE — Patient Instructions (Signed)
Your blood pressure is good today and I refilled your medication today for one year.   For your high cholesterol, I refilled your zetia.  Future cmp and lipid panel today. Please get done in next 1-2 weeks.   Continue to exercise and eat healthy.  Follow up date to be determined after lab review.

## 2017-06-11 NOTE — Progress Notes (Signed)
Subjective:    Patient ID: Ruben Henry, male    DOB: [birth year], 72 y.o.   MRN: [id number]  HPI  Pt in for follow up.  He has been playing more golf with better work.  He is also pretty busy with work.   He states going to [employer] as well. Going about 3 days a week. Some cardio and weight machines.   Pt is not fasting today.  Pt has htn and high cholesterol.Pt on lisinopril and zetia. No side effects.  Pt bp at home controlled. Watching diet as well.   Review of Systems  Constitutional: Negative for chills, fatigue and fever.  HENT: Positive for congestion. Negative for ear discharge, postnasal drip, rhinorrhea, sneezing and sore throat.        Mild allergy symptoms.  Some runny nose after cough.  Respiratory: Negative for cough, chest tightness, shortness of breath and wheezing.   Cardiovascular: Negative for chest pain and palpitations.  Gastrointestinal: Negative for abdominal pain and blood in stool.  Genitourinary: Negative for decreased urine volume, difficulty urinating, discharge, enuresis, flank pain, frequency, genital sores, penile swelling, scrotal swelling and testicular pain.  Musculoskeletal: Negative for back pain.  Skin: Negative for rash.  Neurological: Negative for dizziness and headaches.  Hematological: Negative for adenopathy. Does not bruise/bleed easily.  Psychiatric/Behavioral: Negative for agitation, dysphoric mood, hallucinations and sleep disturbance. The patient is not nervous/anxious.    Past Medical History:  Diagnosis Date  . 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 CAD (coronary artery disease) 07/20/2013  . Hypercholesterolemia   . Hypertension   . Ischemic heart disease    Prior angioplasty to the RCA with stenting of the LCX in 1998  . Osteoarthritis of right hip   . Prostate cancer [facility])    brachytherapy     Social History   Socioeconomic History  . Marital status: Married    Spouse name: Not on file  . Number of  children: 2  . Years of education: Not on file  . Highest education level: Not on file  Occupational History  . Occupation: Lobbyist: [employer]  . Financial resource strain: Not on file  . Food insecurity:    Worry: Not on file    Inability: Not on file  . Transportation needs:    Medical: Not on file    Non-medical: Not on file  Tobacco Use  . Smoking status: Never Smoker  . Smokeless tobacco: Never Used  Substance and Sexual Activity  . Alcohol use: Yes    Alcohol/week: 8.4 oz    Types: 14 Cans of beer per week    Comment: couple of beers a day. Coors Light.  . Drug use: No  . Sexual activity: Yes  Lifestyle  . Physical activity:    Days per week: Not on file    Minutes per session: Not on file  . Stress: Not on file  Relationships  . Social connections:    Talks on phone: Not on file    Gets together: Not on file    Attends religious service: Not on file    Active member of club or organization: Not on file    Attends meetings of clubs or organizations: Not on file    Relationship status: Not on file  . Intimate partner violence:    Fear of current or ex partner: Not on file    Emotionally abused: Not on  file    Physically abused: Not on file    Forced sexual activity: Not on file  Other Topics Concern  . Not on file  Social History Narrative  . Not on file    Past Surgical History:  Procedure Laterality Date  . CORONARY ANGIOPLASTY WITH STENT PLACEMENT  06/21/1996   prior angioplasty of the RCA with stenting of the LCX in 1998  . EYE SURGERY  1969   Right eye /   . TONSILLECTOMY AND ADENOIDECTOMY      Family History  Problem Relation Age of Onset  . Cancer Father   . Heart failure Mother   . Diabetes Sister   . Heart attack Maternal Uncle 67  . Coronary artery disease Maternal Uncle 61       with CABG    No Known Allergies  Current Outpatient Medications on File Prior to Visit  Medication Sig Dispense  Refill  . aspirin 81 MG tablet Take 81 mg by mouth daily.    Marland Kitchen ezetimibe (ZETIA) 10 MG tablet TAKE 1 TABLET (10 MG TOTAL) BY MOUTH DAILY. 90 tablet 0  . fluticasone (FLONASE) 50 MCG/ACT nasal spray Place 2 sprays into the nose as needed.    Marland Kitchen lisinopril (PRINIVIL,ZESTRIL) 10 MG tablet Take 1 tablet (10 mg total) by mouth daily. 90 tablet 0  . Omega-3 Fatty Acids (OMEGA 3 PO) Take 1 capsule by mouth daily.    Marland Kitchen senna (SENOKOT) 8.6 MG tablet Take 1 tablet by mouth daily.    . Sildenafil Citrate (VIAGRA PO) Take 1 tablet by mouth as needed.     No current facility-administered medications on file prior to visit.     BP 136/68   Pulse 71   Ht 5\' 10"  (1.778 m)   Wt 198 lb (89.8 kg)   SpO2 97%   BMI 28.41 kg/m       Objective:   Physical Exam  General  Mental Status - Alert. General Appearance - Well groomed. Not in acute distress.  Skin Rashes- No Rashes. On inspection no worrisome skin lesions/moles(pt saw derm 6 months ago)  HEENT Head- Normal. Ear Auditory Canal - Left- Normal. Right - Normal.Tympanic Membrane- Left- Normal. Right- Normal. Eye Sclera/Conjunctiva- Left- Normal. Right- Normal. Nose & Sinuses Nasal Mucosa- Left-  Boggy and Congested. Right-  Boggy and  Congested.Bilateral no  maxillary and no  frontal sinus pressure. Mouth & Throat Lips: Upper Lip- Normal: no dryness, cracking, pallor, cyanosis, or vesicular eruption. Lower Lip-Normal: no dryness, cracking, pallor, cyanosis or vesicular eruption. Buccal Mucosa- Bilateral- No Aphthous ulcers. Oropharynx- No Discharge or Erythema. Tonsils: Characteristics- Bilateral- No Erythema or Congestion. Size/Enlargement- Bilateral- No enlargement. Discharge- bilateral-None.  Neck Neck- Supple. No Masses.   Chest and Lung Exam Auscultation: Breath Sounds:-Clear even and unlabored.  Cardiovascular Auscultation:Rythm- Regular, rate and rhythm. Murmurs & Other Heart Sounds:Ausculatation of the heart reveal- No  Murmurs.  Lymphatic Head & Neck General Head & Neck Lymphatics: Bilateral: Description- No Localized lymphadenopathy.   Abdomen Inspection:-Inspeection Normal. Palpation/Percussion:Note:No mass. Palpation and Percussion of the abdomen reveal- Non Tender, Non Distended + BS, no rebound or guarding.   Neurologic Cranial Nerve exam:- CN III-XII intact(No nystagmus), symmetric smile. Strength:- 5/5 equal and symmetric strength both upper and lower extremities.    Assessment & Plan:  Your blood pressure is good today and I refilled your medication today for one year.   For your high cholesterol, I refilled your zetia.  Future cmp and lipid panel today. Please get  done in next 1-2 weeks.   Continue to exercise and eat healthy.  Follow up date to be determined after lab review.  Mackie Pai, PA-C

## 2017-06-11 NOTE — Patient Instructions (Signed)
Follow up with [name] PA today as scheduled.  Continue to eat heart healthy diet (full of fruits, vegetables, whole grains, lean protein, water--limit salt, fat, and sugar intake) and increase physical activity as tolerated.   Mr. Ruben Henry , Thank you for taking time to come for your Medicare Wellness Visit. I appreciate your ongoing commitment to your health goals. Please review the following plan we discussed and let me know if I can assist you in the future.   These are the goals we discussed: Goals    . Weight (lb) < 190 lb (86.2 kg)       This is a list of the screening recommended for you and due dates:  Health Maintenance  Topic Date Due  . Flu Shot  09/16/2017  . Colon Cancer Screening  12/29/2021  . Tetanus Vaccine  05/05/2026  .  Hepatitis C: One time screening is recommended by Center for Disease Control  (CDC) for  adults born from [birth year] through 1964.   Completed  . Pneumonia vaccines  Completed    Health Maintenance, Male A healthy lifestyle and preventive care is important for your health and wellness. Ask your health care provider about what schedule of regular examinations is right for you. What should I know about weight and diet? Eat a Healthy Diet  Eat plenty of vegetables, fruits, whole grains, low-fat dairy products, and lean protein.  Do not eat a lot of foods high in solid fats, added sugars, or salt.  Maintain a Healthy Weight Regular exercise can help you achieve or maintain a healthy weight. You should:  Do at least 150 minutes of exercise each week. The exercise should increase your heart rate and make you sweat (moderate-intensity exercise).  Do strength-training exercises at least twice a week.  Watch Your Levels of Cholesterol and Blood Lipids  Have your blood tested for lipids and cholesterol every 5 years starting at 71 years of age. If you are at high risk for heart disease, you should start having your blood tested when you are 71 years old.  You may need to have your cholesterol levels checked more often if: ? Your lipid or cholesterol levels are high. ? You are older than 71 years of age. ? You are at high risk for heart disease.  What should I know about cancer screening? Many types of cancers can be detected early and may often be prevented. Lung Cancer  You should be screened every year for lung cancer if: ? You are a current smoker who has smoked for at least 30 years. ? You are a former smoker who has quit within the past 15 years.  Talk to your health care provider about your screening options, when you should start screening, and how often you should be screened.  Colorectal Cancer  Routine colorectal cancer screening usually begins at 71 years of age and should be repeated every 5-10 years until you are 71 years old. You may need to be screened more often if early forms of precancerous polyps or small growths are found. Your health care provider may recommend screening at an earlier age if you have risk factors for colon cancer.  Your health care provider may recommend using home test kits to check for hidden blood in the stool.  A small camera at the end of a tube can be used to examine your colon (sigmoidoscopy or colonoscopy). This checks for the earliest forms of colorectal cancer.  Prostate and Testicular Cancer  Depending on  your age and overall health, your health care provider may do certain tests to screen for prostate and testicular cancer.  Talk to your health care provider about any symptoms or concerns you have about testicular or prostate cancer.  Skin Cancer  Check your skin from head to toe regularly.  Tell your health care provider about any new moles or changes in moles, especially if: ? There is a change in a mole's size, shape, or color. ? You have a mole that is larger than a pencil eraser.  Always use sunscreen. Apply sunscreen liberally and repeat throughout the day.  Protect  yourself by wearing long sleeves, pants, a wide-brimmed hat, and sunglasses when outside.  What should I know about heart disease, diabetes, and high blood pressure?  If you are 3-67 years of age, have your blood pressure checked every 3-5 years. If you are 77 years of age or older, have your blood pressure checked every year. You should have your blood pressure measured twice-once when you are at a hospital or clinic, and once when you are not at a hospital or clinic. Record the average of the two measurements. To check your blood pressure when you are not at a hospital or clinic, you can use: ? An automated blood pressure machine at a pharmacy. ? A home blood pressure monitor.  Talk to your health care provider about your target blood pressure.  If you are between 42-53 years old, ask your health care provider if you should take aspirin to prevent heart disease.  Have regular diabetes screenings by checking your fasting blood sugar level. ? If you are at a normal weight and have a low risk for diabetes, have this test once every three years after the age of 74. ? If you are overweight and have a high risk for diabetes, consider being tested at a younger age or more often.  A one-time screening for abdominal aortic aneurysm (AAA) by ultrasound is recommended for men aged 57-75 years who are current or former smokers. What should I know about preventing infection? Hepatitis B If you have a higher risk for hepatitis B, you should be screened for this virus. Talk with your health care provider to find out if you are at risk for hepatitis B infection. Hepatitis C Blood testing is recommended for:  Everyone born from [birth year] through 1964.  Anyone with known risk factors for hepatitis C.  Sexually Transmitted Diseases (STDs)  You should be screened each year for STDs including gonorrhea and chlamydia if: ? You are sexually active and are younger than 71 years of age. ? You are older than 71  years of age and your health care provider tells you that you are at risk for this type of infection. ? Your sexual activity has changed since you were last screened and you are at an increased risk for chlamydia or gonorrhea. Ask your health care provider if you are at risk.  Talk with your health care provider about whether you are at high risk of being infected with HIV. Your health care provider may recommend a prescription medicine to help prevent HIV infection.  What else can I do?  Schedule regular health, dental, and eye exams.  Stay current with your vaccines (immunizations).  Do not use any tobacco products, such as cigarettes, chewing tobacco, and e-cigarettes. If you need help quitting, ask your health care provider.  Limit alcohol intake to no more than 2 drinks per day. One drink equals 12  ounces of beer, 5 ounces of wine, or 1 ounces of hard liquor.  Do not use street drugs.  Do not share needles.  Ask your health care provider for help if you need support or information about quitting drugs.  Tell your health care provider if you often feel depressed.  Tell your health care provider if you have ever been abused or do not feel safe at home. This information is not intended to replace advice given to you by your health care provider. Make sure you discuss any questions you have with your health care provider. Document Released: 08/01/2007 Document Revised: 10/02/2015 Document Reviewed: 11/06/2014 Elsevier Interactive Patient Education  [employer].

## 2017-06-21 DIAGNOSIS — C61 Malignant neoplasm of prostate: Secondary | ICD-10-CM | POA: Diagnosis not present

## 2017-12-13 DIAGNOSIS — R05 Cough: Secondary | ICD-10-CM | POA: Diagnosis not present

## 2017-12-13 DIAGNOSIS — Z0130 Encounter for examination of blood pressure without abnormal findings: Secondary | ICD-10-CM | POA: Diagnosis not present

## 2017-12-13 DIAGNOSIS — E782 Mixed hyperlipidemia: Secondary | ICD-10-CM | POA: Diagnosis not present

## 2017-12-14 ENCOUNTER — Ambulatory Visit (INDEPENDENT_AMBULATORY_CARE_PROVIDER_SITE_OTHER)
Admission: RE | Admit: 2017-12-14 | Discharge: 2017-12-14 | Disposition: A | Discharge status: Home / Self Care | Payer: Medicare Other | Source: Ambulatory Visit | Attending: Pulmonary Disease | Admitting: Pulmonary Disease

## 2017-12-14 ENCOUNTER — Ambulatory Visit (INDEPENDENT_AMBULATORY_CARE_PROVIDER_SITE_OTHER): Payer: Medicare Other | Admitting: Pulmonary Disease

## 2017-12-14 ENCOUNTER — Encounter: Payer: Self-pay | Admitting: Pulmonary Disease

## 2017-12-14 VITALS — BP 132/80 | HR 83 | Ht 70.5 in | Wt 198.0 lb

## 2017-12-14 DIAGNOSIS — R05 Cough: Secondary | ICD-10-CM | POA: Diagnosis not present

## 2017-12-14 DIAGNOSIS — R059 Cough, unspecified: Secondary | ICD-10-CM

## 2017-12-14 MED ORDER — BENZONATATE 200 MG PO CAPS: 200.0000 mg | ORAL_CAPSULE | Freq: Three times a day (TID) | ORAL | 1 refills | Status: DC | PRN

## 2017-12-14 MED ORDER — PREDNISONE 10 MG PO TABS: 10.0000 mg | ORAL_TABLET | Freq: Two times a day (BID) | ORAL | 0 refills | Status: DC

## 2017-12-14 MED ORDER — AMOXICILLIN-POT CLAVULANATE 875-125 MG PO TABS: 1.0000 | ORAL_TABLET | Freq: Two times a day (BID) | ORAL | 0 refills | Status: DC

## 2017-12-14 NOTE — Patient Instructions (Signed)
Patient with acute cough  cough has been lingering for the last 4 weeks  Associated with wheezing  Course of antibiotics-Augmentin Short course of steroids Tessalon Perles  I will see you back in the office in about 6 weeks.

## 2017-12-14 NOTE — Progress Notes (Signed)
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 aspirin 81 MG tablet Take 81 mg by mouth daily.  Marland Kitchen ezetimibe (ZETIA) 10 MG tablet TAKE 1 TABLET (10 MG TOTAL) BY MOUTH DAILY.  . fluticasone (FLONASE) 50 MCG/ACT nasal spray Place 2 sprays into the nose as needed.  Marland Kitchen lisinopril (PRINIVIL,ZESTRIL) 10 MG tablet Take 1 tablet (10 mg total) by mouth daily.  . Omega-3 Fatty Acids (OMEGA 3 PO) Take 1 capsule by mouth daily.  Marland Kitchen senna (SENOKOT) 8.6 MG tablet Take 1 tablet by mouth daily.  . Sildenafil Citrate (VIAGRA PO) Take 1 tablet by mouth as needed.   No facility-administered encounter medications on file as of 12/14/2017.     Allergies as of 12/14/2017  . (No Known Allergies)    Past Medical History:  Diagnosis Date  . 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 CAD (coronary artery disease) 07/20/2013  . Hypercholesterolemia   . Hypertension   . Ischemic heart disease    Prior angioplasty to the RCA with stenting of the LCX in 1998  . Osteoarthritis of right hip   . Prostate cancer [facility])    brachytherapy    Past Surgical History:  Procedure Laterality Date  . CORONARY ANGIOPLASTY WITH STENT PLACEMENT  06/21/1996   prior angioplasty of the RCA with stenting of the LCX in 1998  . EYE SURGERY  1969   Right eye /   . TONSILLECTOMY AND ADENOIDECTOMY      Family History  Problem Relation Age of Onset  . Cancer Father   . Heart failure Mother   . Diabetes Sister   . Heart attack Maternal Uncle 67  . Coronary artery disease Maternal Uncle 61       with CABG    Social History   Socioeconomic History  . Marital status: Married    Spouse name: Not on file  . Number of children: 2  . Years of education: Not on file  . Highest education level: Not on file  Occupational History  . Occupation: Lobbyist: [employer]  . Financial resource strain: Not on file  . Food insecurity:    Worry: Not on file    Inability: Not on file  . Transportation needs:    Medical: Not on file    Non-medical: Not on file  Tobacco Use  . Smoking status: Never Smoker  . Smokeless tobacco: Never Used  Substance and Sexual Activity  . Alcohol use: Yes    Alcohol/week: 14.0 standard drinks    Types: 14 Cans of beer per week    Comment: couple of beers a day. Coors Light.  . Drug use: No  . Sexual activity:  Yes  Lifestyle  . Physical activity:    Days per week: Not on file    Minutes per session: Not on file  . Stress: Not on file  Relationships  . Social connections:    Talks on phone: Not on file    Gets together: Not on file    Attends religious service: Not on file    Active member of club or organization: Not on file    Attends meetings of clubs or organizations: Not on file    Relationship status: Not on file  . Intimate partner violence:    Fear of current or ex partner: Not on file    Emotionally abused: Not on file    Physically abused: Not on file    Forced sexual activity: Not on file  Other Topics Concern  . Not on file  Social History Narrative  . Not on file    Review of Systems  Constitutional: Negative.   HENT: Negative.   Eyes: Negative.   Respiratory: Positive for cough. Negative for shortness of breath.   Cardiovascular:  Negative.   Gastrointestinal: Negative.   Endocrine: Negative.   Genitourinary: Negative.     Vitals:   12/14/17 1611 12/14/17 1612  BP:  132/80  Pulse: 83   SpO2: 97%      Physical Exam  Constitutional: He is oriented to person, place, and time. He appears well-developed and well-nourished.  HENT:  Head: Normocephalic and atraumatic.  Eyes: Pupils are equal, round, and reactive to light. Conjunctivae and EOM are normal. Right eye exhibits no discharge. Left eye exhibits no discharge.  Neck: Normal range of motion. Neck supple. No tracheal deviation present. No thyromegaly present.  Cardiovascular: Normal rate and regular rhythm.  Pulmonary/Chest: Effort normal and breath sounds normal. No respiratory distress. He has no wheezes.  Abdominal: Soft. Bowel sounds are normal. He exhibits no distension.  Musculoskeletal: Normal range of motion. He exhibits no edema or deformity.  Neurological: He is alert and oriented to person, place, and time. No cranial nerve deficit. Coordination normal.  Skin: Skin is warm and dry. No rash noted. No erythema.  Psychiatric: He has a normal mood and affect.   Assessment:   History of acute cough from about a month Associated wheezing Had similar symptoms about a year ago   Plan/Recommendations:  We will give him a course of Augmentin 875 p.o. twice daily for 7 days Prednisone 10 mg p.o. twice daily for 5 days Obtain a chest x-ray Tessalon Perles 200 mg p.o. 3 times daily as needed  We will see him back in the office in about 6 weeks   Sherrilyn Rist MD [facility] 12/14/2017, 4:25 PM  CC: Mackie Pai, PA-C

## 2017-12-15 DIAGNOSIS — H2701 Aphakia, right eye: Secondary | ICD-10-CM | POA: Diagnosis not present

## 2017-12-15 DIAGNOSIS — H52202 Unspecified astigmatism, left eye: Secondary | ICD-10-CM | POA: Diagnosis not present

## 2017-12-15 DIAGNOSIS — H524 Presbyopia: Secondary | ICD-10-CM | POA: Diagnosis not present

## 2017-12-27 ENCOUNTER — Telehealth: Payer: Self-pay | Admitting: Pulmonary Disease

## 2017-12-27 DIAGNOSIS — C61 Malignant neoplasm of prostate: Secondary | ICD-10-CM | POA: Diagnosis not present

## 2017-12-27 NOTE — Telephone Encounter (Signed)
Sorry to hear that patient's symptoms have returned.   Cough home instructions:  . We believe you could have a chronic/cyclical cough that is aggravated by reflux , coughing, and drainage.  . Goal is to not Cough or clear throat.  Marland Kitchen Avoid coughing or clearing throat by using:  o non-mint products/sugarless candy o Water o ice chips o Remember NO MINT PRODUCTS  . Medications to use:  o Delsym 2 tsp every 12 hrs for cough o Tessalon Three times a day  As needed  Cough.  o GERD prevention with PPI or H2 blocker  o Daily antihistamine  o Chlor tabs 4mg  2 at bedtime  for nasal drip until cough is 100% cough free.     Chart review does reveal the patient is still on an ACE inhibitor.  He could be trialed off of this.  He can either do that at our office or he can do that with primary care or whoever is managing his ACE inhibitor which is typically for blood pressure. This would require a office visit.  Since patient symptoms have not improved with Augmentin as well as prednisone that he needs to follow-up with our office.  I do not believe additional antibiotics or prednisone are indicated over the phone at this time.  Wyn Quaker FNP

## 2017-12-27 NOTE — Telephone Encounter (Signed)
Left message for patient to call back  

## 2017-12-27 NOTE — Telephone Encounter (Signed)
Spoke with patient. He stated that he finished his round of prednisone and amoxicillin on 12/21/17. On 12/24/17, his cough returned, especially at night. He still has the Tessalon perles on hand but they are not helping. He also stated that his cough is nonproductive. Denied any symptoms of fever, chills or SOB.   He wants to know if he could have another round of abx and prednisone.   Pharmacy is [facility] on [address].   Aaron Edelman, please advise AO is not available. Thanks!

## 2017-12-29 NOTE — Telephone Encounter (Signed)
Called pt and advised message from the provider. Pt understood and verbalized understanding. Nothing further is needed.    

## 2018-01-06 DIAGNOSIS — Z23 Encounter for immunization: Secondary | ICD-10-CM | POA: Diagnosis not present

## 2018-01-25 ENCOUNTER — Encounter: Payer: Self-pay | Admitting: Pulmonary Disease

## 2018-01-25 ENCOUNTER — Ambulatory Visit (INDEPENDENT_AMBULATORY_CARE_PROVIDER_SITE_OTHER): Payer: Medicare Other | Admitting: Pulmonary Disease

## 2018-01-25 VITALS — BP 132/80 | HR 77 | Ht 70.0 in | Wt 186.0 lb

## 2018-01-25 DIAGNOSIS — R059 Cough, unspecified: Secondary | ICD-10-CM

## 2018-01-25 DIAGNOSIS — R05 Cough: Secondary | ICD-10-CM | POA: Diagnosis not present

## 2018-01-25 MED ORDER — MONTELUKAST SODIUM 10 MG PO TABS: 10.0000 mg | ORAL_TABLET | Freq: Every day | ORAL | 6 refills | Status: DC

## 2018-01-25 MED ORDER — PREDNISONE 10 MG PO TABS: 10.0000 mg | ORAL_TABLET | Freq: Every day | ORAL | 0 refills | Status: DC

## 2018-01-25 MED ORDER — FLUTICASONE PROPIONATE 50 MCG/ACT NA SUSP: 2.0000 | Freq: Every day | NASAL | 2 refills | Status: DC

## 2018-01-25 MED ORDER — CEFDINIR 300 MG PO CAPS: 300.0000 mg | ORAL_CAPSULE | Freq: Two times a day (BID) | ORAL | 0 refills | Status: DC

## 2018-01-25 NOTE — Patient Instructions (Signed)
Congestion/wheezing Postnasal drip  We will give your course of steroids Course of antibiotics Use Flonase regularly Start Singulair  We will see her back in the office in about 6 weeks Call with significant concerns

## 2018-01-25 NOTE — Progress Notes (Signed)
[name]    [id number]    [birth year]  Primary Care Physician:Saguier, Iris Pert  Referring Physician: Mackie Pai, PA-C [address] [place], [place] [address]  Chief complaint:   History of a cough Recurrent symptoms   HPI: Recent cough for which he used a course of antibiotics, symptoms did improve with recurrence He mostly has a cough at night Has no fevers or chills Had similar symptoms in the past  Has been using Flonase-not really helping-has been using it just intermittently He held his anti-hypertensive  recently-did not feel it made a difference to his cough  No fevers, no chills Denies any significant congestion but does feel he has postnasal drip  Associated wheezing Denies any change in his voice  Denies snoring history  Never smoker  Worked as an Chief Financial Officer, 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 aspirin 81 MG tablet Take 81 mg by mouth daily.  . benzonatate (TESSALON) 200 MG capsule Take 1 capsule (200 mg total) by mouth 3 (three) times daily as needed for cough.  . ezetimibe (ZETIA) 10 MG tablet TAKE 1 TABLET (10 MG TOTAL) BY MOUTH DAILY.  . fluticasone (FLONASE) 50 MCG/ACT nasal spray Place 2 sprays into the nose as needed.  Marland Kitchen lisinopril (PRINIVIL,ZESTRIL) 10 MG tablet Take 1 tablet (10 mg total) by mouth daily.  . Omega-3 Fatty Acids (OMEGA 3 PO) Take 1 capsule by mouth daily.  Marland Kitchen senna (SENOKOT) 8.6 MG tablet Take 1 tablet by mouth daily.  . Sildenafil Citrate (VIAGRA PO) Take 1 tablet by mouth as needed.  . [DISCONTINUED] amoxicillin-clavulanate (AUGMENTIN) 875-125 MG tablet Take 1 tablet by mouth 2 (two) times daily.  . [DISCONTINUED] predniSONE (DELTASONE) 10 MG tablet Take 1 tablet (10 mg total) by mouth 2 (two) times daily with a meal.   No facility-administered encounter medications on file as of 01/25/2018.      Allergies as of 01/25/2018  . (No Known Allergies)    Past Medical History:  Diagnosis Date  . 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 CAD (coronary artery disease) 07/20/2013  . Hypercholesterolemia   . Hypertension   . Ischemic heart disease    Prior angioplasty to the RCA with stenting of the LCX in 1998  . Osteoarthritis of right hip   . Prostate cancer [facility])    brachytherapy    Past Surgical History:  Procedure Laterality Date  . CORONARY ANGIOPLASTY WITH STENT PLACEMENT  06/21/1996   prior angioplasty of the RCA with stenting of the LCX in 1998  . EYE SURGERY  1969   Right eye /   . TONSILLECTOMY AND ADENOIDECTOMY      Family History  Problem Relation Age of Onset  . Cancer Father   . Heart failure Mother   . Diabetes Sister   . Heart attack Maternal Uncle 67  . Coronary artery disease Maternal Uncle 61       with CABG    Social History   Socioeconomic History  . Marital status: Married    Spouse name: Not on file  . Number of children: 2  . Years of education: Not on file  . Highest education level: Not on file  Occupational History  . Occupation: Lobbyist: [employer]  . Financial resource strain:  Not on file  . Food insecurity:    Worry: Not on file    Inability: Not on file  . Transportation needs:    Medical: Not on file    Non-medical: Not on file  Tobacco Use  . Smoking status: Never Smoker  . Smokeless tobacco: Never Used  Substance and Sexual Activity  . Alcohol use: Yes    Alcohol/week: 14.0 standard drinks    Types: 14 Cans of beer per week    Comment: couple of beers a day. Coors Light.  . Drug use: No  . Sexual activity: Yes  Lifestyle  . Physical activity:    Days per week: Not on file    Minutes per session: Not on file  . Stress: Not on file  Relationships  . Social connections:    Talks on phone: Not on file    Gets together: Not on file    Attends religious service: Not on  file    Active member of club or organization: Not on file    Attends meetings of clubs or organizations: Not on file    Relationship status: Not on file  . Intimate partner violence:    Fear of current or ex partner: Not on file    Emotionally abused: Not on file    Physically abused: Not on file    Forced sexual activity: Not on file  Other Topics Concern  . Not on file  Social History Narrative  . Not on file    Review of Systems  Constitutional: Negative.   HENT: Positive for congestion.   Eyes: Negative.   Respiratory: Positive for cough and wheezing. Negative for shortness of breath.   Cardiovascular: Negative.   Gastrointestinal: Negative.     Vitals:   01/25/18 1458  BP: 132/80  Pulse: 77  SpO2: 97%     Physical Exam  Constitutional: He appears well-developed and well-nourished.  HENT:  Head: Normocephalic and atraumatic.  Eyes: Pupils are equal, round, and reactive to light. Conjunctivae and EOM are normal. Right eye exhibits no discharge. Left eye exhibits no discharge.  Neck: Normal range of motion. Neck supple. No tracheal deviation present. No thyromegaly present.  Cardiovascular: Normal rate and regular rhythm.  Pulmonary/Chest: Effort normal. No respiratory distress. He has wheezes. He has no rales. He exhibits no tenderness.  Abdominal: Soft. Bowel sounds are normal. He exhibits no distension.  Psychiatric: He has a normal mood and affect.   Assessment:   History of acute cough from about a month with recurrence of symptoms Nasal stuffiness and congestion-rhinitis with possible postnasal drip Associated wheezing  Had similar symptoms about a year ago   Plan/Recommendations:  We will give him a course of Omnicef for 7 days Start on Singulair 10 daily Prednisone 10 mg p.o. twice daily for 5 days Continue to use Flonase on a regular basis  We will see him back in the office in about 6 weeks   Sherrilyn Rist MD [facility] 01/25/2018, 3:00 PM  CC: Mackie Pai, PA-C

## 2018-01-26 ENCOUNTER — Encounter: Payer: Self-pay | Admitting: Cardiology

## 2018-02-17 ENCOUNTER — Ambulatory Visit: Payer: Medicare Other | Admitting: Cardiology

## 2018-02-17 NOTE — Progress Notes (Signed)
Ruben Henry Date of Birth: November 29, 1945 Medical Record #[id number]  History of Present Illness: Ruben Henry is seen back today for a follow up visit. Has known CAD with remote angioplasty of the RCA and stenting of the LCX with an AVE stent in 1998. 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 He had an ETT in August 2015 that was negative for ischemia. Stress Myoview in July 2018 was low risk. He also has a history of elevated transaminases. He had an abdominal US in August 2015 showing diffuse hepatic steatosis/hepatocellular disease. Followed by Dr. Harlene Ramus and no further work up recommended.  On follow up today he reports he is feeling well.  He has had a persistent sinus infection over the past 4-6 weeks and has had 2 courses of antibiotics. [facility] He  denies any chest pain or SOB. No edema or palpitations. 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 He does note that he has gained weight this year.  Current Outpatient Medications on File Prior to Visit  Medication Sig Dispense Refill  . aspirin 81 MG tablet Take 81 mg by mouth daily.    . benzonatate (TESSALON) 200 MG capsule Take 1 capsule (200 mg total) by mouth 3 (three) times daily as needed for cough. 30 capsule 1  . ezetimibe (ZETIA) 10 MG tablet TAKE 1 TABLET (10 MG TOTAL) BY MOUTH DAILY. 90 tablet 3  . fluticasone (FLONASE) 50 MCG/ACT nasal spray Place 2 sprays into both nostrils daily. 16 g 2  . lisinopril (PRINIVIL,ZESTRIL) 10 MG tablet Take 1 tablet (10 mg total) by mouth daily. 90 tablet 3  . Omega-3 Fatty Acids (OMEGA 3 PO) Take 1 capsule by mouth daily.    Marland Kitchen senna (SENOKOT) 8.6 MG tablet Take 1 tablet by mouth daily.    . Sildenafil Citrate (VIAGRA PO) Take 1 tablet by mouth as needed.     No current facility-administered medications on file prior to visit.     No Known Allergies  Past Medical History:    Diagnosis Date  . 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 CAD (coronary artery disease) 07/20/2013  . Hypercholesterolemia   . Hypertension   . Ischemic heart disease    Prior angioplasty to the RCA with stenting of the LCX in 1998  . Osteoarthritis of right hip   . Prostate cancer [facility])    brachytherapy    Past Surgical History:  Procedure Laterality Date  . CORONARY ANGIOPLASTY WITH STENT PLACEMENT  06/21/1996   prior angioplasty of the RCA with stenting of the LCX in 1998  . EYE SURGERY  1969   Right eye /   . TONSILLECTOMY AND ADENOIDECTOMY      Social History   Tobacco Use  Smoking Status Never Smoker  Smokeless Tobacco Never Used    Social History   Substance and Sexual Activity  Alcohol Use Yes  . Alcohol/week: 14.0 standard drinks  . Types: 14 Cans of beer per week   Comment: couple of beers a day. Coors Light.    Family History  Problem Relation Age of Onset  . Cancer Father   . Heart failure Mother   . Diabetes Sister   . Heart attack Maternal Uncle 67  . Coronary artery disease Maternal Uncle 61       with CABG    Review of Systems: The review of  systems is per the HPI.  All other systems were reviewed and are negative.  Physical Exam: BP 138/90   Pulse 78   Ht 5\' 10"  (1.778 m)   Wt 206 lb 3.2 oz (93.5 kg)   BMI 29.59 kg/m  GENERAL:  Well appearing WM in NAD HEENT:  PERRL, EOMI, sclera are clear. Oropharynx is clear. NECK:  No jugular venous distention, carotid upstroke brisk and symmetric, no bruits, no thyromegaly or adenopathy LUNGS:  Clear to auscultation bilaterally CHEST:  Unremarkable HEART:  RRR,  PMI not displaced or sustained,S1 and S2 within normal limits, no S3, no S4: no clicks, no rubs, no murmurs ABD:  Soft, nontender. BS +, no masses or bruits. No hepatomegaly, no splenomegaly EXT:  2 + pulses throughout, no edema, no cyanosis no clubbing SKIN:  Warm and dry.  No rashes NEURO:  Alert and oriented x 3. Cranial nerves II  through XII intact. PSYCH:  Cognitively intact     LABORATORY DATA:   Lab Results  Component Value Date   WBC 6.9 01/29/2007   HGB 9.8 (L) 01/29/2007   HCT 27.6 (L) 01/29/2007   PLT 205 01/29/2007   GLUCOSE 95 03/31/2016   CHOL 186 03/31/2016   TRIG 82.0 03/31/2016   HDL 66.50 03/31/2016   LDLDIRECT 106.6 03/24/2012   LDLCALC 103 (H) 03/31/2016   ALT 97 (H) 03/31/2016   AST 47 (H) 03/31/2016   NA 136 03/31/2016   K 3.7 03/31/2016   CL 102 03/31/2016   CREATININE 0.77 03/31/2016   BUN 14 03/31/2016   CO2 26 03/31/2016   INR 0.9 01/24/2007   Dated 12/27/17: potassium and ALT normal.  Ecg: today- NSR rate 78 bpm. Normal. I have personally reviewed and interpreted this study.  Myoview 09/09/16: Study Highlights   Addendum by Sueanne Margarita, MD on Wed Sep 09, 2016 3:11 PM   The left ventricular ejection fraction is normal (55-65%).  Nuclear stress EF: 57%.  Blood pressure demonstrated a normal response to exercise.  There was 1-64mm of upsloping ST segment depression in the inferolateral leads at peak exercise that resolved immediately in recovery  There is a small defect of mild severity present in the basal inferior and apical inferior location. The defect is non-reversible and c/w diaphragmatic attenuation artifact. No ischemia noted.  This is a low risk study    Finalized by Sueanne Margarita, MD on Wed Sep 09, 2016 3:09 PM   The left ventricular ejection fraction is normal (55-65%).  Nuclear stress EF: 57%.  Blood pressure demonstrated a normal response to exercise.  There was 1-39mm of upsloping ST segment depression in the inferolateral leads at peak exercise that resolved immediately in recovery  The study is normal.  This is a low risk study.    Assessment / Plan:  1. CAD/ Ischemia heart disease - s/p PTCA of the RCA and stenting of the LCx with DES 1998. He is asymptomatic.   Myoview in July 2018 was low risk without ischemia and normal EF.  Continue medical management with ASA    2. HTN - blood pressure is well controlled on lisinopril.   3. HLD - on Zetia. Intolerant of statins and red yeast rice due to elevated transaminases. Hasn't had lipids checked in 2 years. Will update fasting labs today. Goal LDL < 70.  Will consider PCSK 9 inhibitor if not at goal.

## 2018-02-21 ENCOUNTER — Ambulatory Visit (INDEPENDENT_AMBULATORY_CARE_PROVIDER_SITE_OTHER): Payer: Medicare Other | Admitting: Cardiology

## 2018-02-21 ENCOUNTER — Encounter: Payer: Self-pay | Admitting: Cardiology

## 2018-02-21 VITALS — BP 138/90 | HR 78 | Ht 70.0 in | Wt 206.2 lb

## 2018-02-21 DIAGNOSIS — E785 Hyperlipidemia, unspecified: Secondary | ICD-10-CM | POA: Diagnosis not present

## 2018-02-21 DIAGNOSIS — I10 Essential (primary) hypertension: Secondary | ICD-10-CM

## 2018-02-21 DIAGNOSIS — I2510 Atherosclerotic heart disease of native coronary artery without angina pectoris: Secondary | ICD-10-CM

## 2018-02-21 LAB — LIPID PANEL
CHOL/HDL RATIO: 2.8 ratio (ref 0.0–5.0)
Cholesterol, Total: 210 mg/dL — ABNORMAL HIGH (ref 100–199)
HDL: 74 mg/dL (ref >39)
LDL CALC: 120 mg/dL — AB (ref 0–99)
TRIGLYCERIDES: 78 mg/dL (ref 0–149)
VLDL Cholesterol Cal: 16 mg/dL (ref 5–40)

## 2018-02-21 LAB — BASIC METABOLIC PANEL
BUN/Creatinine Ratio: 14 (ref 10–24)
BUN: 13 mg/dL (ref 8–27)
CALCIUM: 9 mg/dL (ref 8.6–10.2)
CO2: 20 mmol/L (ref 20–29)
CREATININE: 0.93 mg/dL (ref 0.76–1.27)
Chloride: 96 mmol/L (ref 96–106)
GFR calc Af Amer: 95 mL/min/{1.73_m2} (ref >59)
GFR, EST NON AFRICAN AMERICAN: 82 mL/min/{1.73_m2} (ref >59)
Glucose: 106 mg/dL — ABNORMAL HIGH (ref 65–99)
POTASSIUM: 4.6 mmol/L (ref 3.5–5.2)
Sodium: 135 mmol/L (ref 134–144)

## 2018-02-21 LAB — HEPATIC FUNCTION PANEL
ALT: 82 IU/L — AB (ref 0–44)
AST: 70 IU/L — ABNORMAL HIGH (ref 0–40)
Albumin: 4.6 g/dL (ref 3.5–4.8)
Alkaline Phosphatase: 70 IU/L (ref 39–117)
BILIRUBIN, DIRECT: 0.24 mg/dL (ref 0.00–0.40)
Bilirubin Total: 0.7 mg/dL (ref 0.0–1.2)
Total Protein: 6.7 g/dL (ref 6.0–8.5)

## 2018-03-08 ENCOUNTER — Ambulatory Visit (INDEPENDENT_AMBULATORY_CARE_PROVIDER_SITE_OTHER): Payer: Medicare Other | Admitting: Pharmacist Clinician (PhC)/ Clinical Pharmacy Specialist

## 2018-03-08 DIAGNOSIS — E785 Hyperlipidemia, unspecified: Secondary | ICD-10-CM

## 2018-03-08 DIAGNOSIS — I2510 Atherosclerotic heart disease of native coronary artery without angina pectoris: Secondary | ICD-10-CM | POA: Diagnosis not present

## 2018-03-08 NOTE — Assessment & Plan Note (Signed)
Patient with hyperlipidemia and ASCVD, not currently at goal on ezetimibe 10 mg daily.  Do not want to further attempt statin drugs due to previous increases in liver enzymes.  We reviewed the options of PCSK-9 inhibitor, bempedoic acid and research options.  Patient would like to consider trial of the bempedoic acid once it receives FDA approval next month.  In the meantime, he was encouraged to continue with his exercise and positive dietary choices.  We will contact him as soon as the drug receives approval and then work on coverage thru his insurance.

## 2018-03-08 NOTE — Patient Instructions (Addendum)
Thank you for coming in today.  I will contact you once we get FDA approval for the bempedoic acid in late February  If you have any questions or concerns before then, please call our office

## 2018-03-08 NOTE — Progress Notes (Signed)
03/08/2018 Ruben Henry  [id number]   HPI:  Ruben Henry is a 73 y.o. adult patient of Dr Martinique, who presents today for a lipid clinic evaluation.  In addition to hyperlipidemia, his medical history is significant for CAD (angioplasty of RCA ans AVE stent to LCX), hypertension, prostate cancer and right eye blindness (cataract).     In the past he has tried several statin drugs including atorvastatin and simvastatin, but had to discontinue because of a rise in liver transaminase levels.  He currently is on ezetimibe without problem, although his enzymes are still slightly elevated.    Current Medications: ezetimibe 10 mg daily  Cholesterol Goals: LDL < 70   Intolerant/previously tried: simvastatin, atorvastatin  Family history:   Mother died in 26's with heart issues  Maternal uncle with heart issues also  Sister with LF elevations with statins also  Father died at 39 from cancer  2 children no problems  Diet:  His wife is a recently retired Automotive engineer.  Mostly home cooked; f/v mostly fresh; no dietary restriction; not big snack eater although likes popcorn  Exercise:  Starts bowling next Monday night; [place] [facility] - weight machines, walk track, golf in warm weather  Labs:   02/2018:  TC 210, TG 78, HDL 74, LDL 120  AST 70, ALT 82  03/2016:  TC186, TG 82,  HDL 66.5, LDL 103 AST 47, ALT 97  Current Outpatient Medications  Medication Sig Dispense Refill  . aspirin 81 MG tablet Take 81 mg by mouth daily.    Marland Kitchen ezetimibe (ZETIA) 10 MG tablet TAKE 1 TABLET (10 MG TOTAL) BY MOUTH DAILY. 90 tablet 3  . fluticasone (FLONASE) 50 MCG/ACT nasal spray Place 2 sprays into both nostrils daily. 16 g 2  . lisinopril (PRINIVIL,ZESTRIL) 10 MG tablet Take 1 tablet (10 mg total) by mouth daily. 90 tablet 3  . Omega-3 Fatty Acids (OMEGA 3 PO) Take 1 capsule by mouth daily.    Marland Kitchen senna (SENOKOT) 8.6 MG tablet Take 1 tablet by mouth daily.    . benzonatate (TESSALON) 200 MG capsule Take 1 capsule  (200 mg total) by mouth 3 (three) times daily as needed for cough. (Patient not taking: Reported on 03/08/2018) 30 capsule 1  . Sildenafil Citrate (VIAGRA PO) Take 1 tablet by mouth as needed.     No current facility-administered medications for this visit.     No Known Allergies  Past Medical History:  Diagnosis Date  . 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 CAD (coronary artery disease) 07/20/2013  . Hypercholesterolemia   . Hypertension   . Ischemic heart disease    Prior angioplasty to the RCA with stenting of the LCX in 1998  . Osteoarthritis of right hip   . Prostate cancer [facility])    brachytherapy    Blood pressure 112/76, pulse 80, resp. rate 16.   HLD (hyperlipidemia) Patient with hyperlipidemia and ASCVD, not currently at goal on ezetimibe 10 mg daily.  Do not want to further attempt statin drugs due to previous increases in liver enzymes.  We reviewed the options of PCSK-9 inhibitor, bempedoic acid and research options.  Patient would like to consider trial of the bempedoic acid once it receives FDA approval next month.  In the meantime, he was encouraged to continue with his exercise and positive dietary choices.  We will contact him as soon as the drug receives approval and then work on coverage thru his insurance.  Tommy Medal PharmD CPP [facility]

## 2018-03-17 ENCOUNTER — Ambulatory Visit: Payer: Medicare Other | Admitting: Pulmonary Disease

## 2018-03-18 DIAGNOSIS — J329 Chronic sinusitis, unspecified: Secondary | ICD-10-CM | POA: Diagnosis not present

## 2018-05-05 ENCOUNTER — Other Ambulatory Visit: Payer: Self-pay | Admitting: Otolaryngology

## 2018-05-05 DIAGNOSIS — J329 Chronic sinusitis, unspecified: Secondary | ICD-10-CM

## 2018-05-11 ENCOUNTER — Other Ambulatory Visit: Payer: Medicare Other

## 2018-07-04 DIAGNOSIS — C61 Malignant neoplasm of prostate: Secondary | ICD-10-CM | POA: Diagnosis not present

## 2018-07-05 ENCOUNTER — Ambulatory Visit
Admission: RE | Admit: 2018-07-05 | Discharge: 2018-07-05 | Disposition: A | Discharge status: Home / Self Care | Payer: Medicare Other | Source: Ambulatory Visit | Attending: Otolaryngology | Admitting: Otolaryngology

## 2018-07-05 DIAGNOSIS — J329 Chronic sinusitis, unspecified: Secondary | ICD-10-CM | POA: Diagnosis not present

## 2018-09-15 ENCOUNTER — Other Ambulatory Visit: Payer: Self-pay

## 2018-10-14 ENCOUNTER — Other Ambulatory Visit: Payer: Self-pay | Admitting: Medical

## 2018-10-14 DIAGNOSIS — Z23 Encounter for immunization: Secondary | ICD-10-CM

## 2018-10-14 DIAGNOSIS — I10 Essential (primary) hypertension: Secondary | ICD-10-CM

## 2018-10-14 NOTE — Telephone Encounter (Signed)
Pt due for a follow up for more refills.

## 2018-11-09 DIAGNOSIS — R070 Pain in throat: Secondary | ICD-10-CM | POA: Diagnosis not present

## 2018-11-09 DIAGNOSIS — R05 Cough: Secondary | ICD-10-CM | POA: Diagnosis not present

## 2018-11-15 DIAGNOSIS — Z23 Encounter for immunization: Secondary | ICD-10-CM | POA: Diagnosis not present

## 2018-12-02 NOTE — Progress Notes (Addendum)
Virtual Visit via Video Note  I connected with patient on 12/05/18 at  9:30 AM EDT by audio enabled telemedicine application and verified that I am speaking with the correct person using two identifiers.   THIS ENCOUNTER IS A VIRTUAL VISIT DUE TO COVID-19 - PATIENT WAS NOT SEEN IN THE OFFICE. PATIENT HAS CONSENTED TO VIRTUAL VISIT / TELEMEDICINE VISIT   Location of patient: home  Location of provider: office  I discussed the limitations of evaluation and management by telemedicine and the availability of in person appointments. The patient expressed understanding and agreed to proceed.   Subjective:   Ruben Henry is a 73 y.o. male who presents for Medicare Annual/Subsequent preventive examination.  Pt is still working full time.40-50 hrs per week. Plays golf as much as possible.  Review of Systems:    Home Safety/Smoke Alarms: Feels safe in home. Smoke alarms in place.  Lives w/ wife in 2 story home. No issues with stairs. Master on 1st.   Male:   CCS- 12/29/16-normal     PSA- No results found for: PSA      Objective:    Vitals: Unable to assess. This visit is enabled though telemedicine due to Covid 19.    Advanced Directives 12/05/2018 06/11/2017 11/01/2015  Does Patient Have a Medical Advance Directive? No No Yes  Type of Advance Directive - - Living will  Copy of [place] in Chart? - - No - copy requested  Would patient like information on creating a medical advance directive? No - Patient declined No - Patient declined -    Tobacco Social History   Tobacco Use  Smoking Status Never Smoker  Smokeless Tobacco Never Used     Counseling given: Not Answered   Clinical Intake:     Pain : No/denies pain    Past Medical History:  Diagnosis Date  . 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 CAD (coronary artery disease) 07/20/2013  . Hypercholesterolemia   . Hypertension   . Ischemic heart disease    Prior angioplasty to the  RCA with stenting of the LCX in 1998  . Osteoarthritis of right hip   . Prostate cancer [facility])    brachytherapy   Past Surgical History:  Procedure Laterality Date  . CORONARY ANGIOPLASTY WITH STENT PLACEMENT  06/21/1996   prior angioplasty of the RCA with stenting of the LCX in 1998  . EYE SURGERY  1969   Right eye /   . TONSILLECTOMY AND ADENOIDECTOMY     Family History  Problem Relation Age of Onset  . Cancer Father   . Heart failure Mother   . Diabetes Sister   . Heart attack Maternal Uncle 67  . Coronary artery disease Maternal Uncle 61       with CABG   Social History   Socioeconomic History  . Marital status: Married    Spouse name: Not on file  . Number of children: 2  . Years of education: Not on file  . Highest education level: Not on file  Occupational History  . Occupation: Lobbyist: [employer]  . Financial resource strain: Not on file  . Food insecurity    Worry: Not on file    Inability: Not on file  . Transportation needs    Medical: Not on file    Non-medical: Not on file  Tobacco Use  . Smoking status: Never Smoker  .  Smokeless tobacco: Never Used  Substance and Sexual Activity  . Alcohol use: Yes    Alcohol/week: 14.0 standard drinks    Types: 14 Cans of beer per week    Comment: couple of beers a day. Co[employer].  . Drug use: No  . Sexual activity: Yes  Lifestyle  . Physical activity    Days per week: Not on file    Minutes per session: Not on file  . Stress: Not on file  Relationships  . Social Herbalist on phone: Not on file    Gets together: Not on file    Attends religious service: Not on file    Active member of club or organization: Not on file    Attends meetings of clubs or organizations: Not on file    Relationship status: Not on file  Other Topics Concern  . Not on file  Social History Narrative  . Not on file    Outpatient Encounter Medications as of 12/05/2018   Medication Sig  . aspirin 81 MG tablet Take 81 mg by mouth every other day.   . benzonatate (TESSALON) 200 MG capsule Take 1 capsule (200 mg total) by mouth 3 (three) times daily as needed for cough.  . ezetimibe (ZETIA) 10 MG tablet Take 1 tablet by mouth once daily  . fluticasone (FLONASE) 50 MCG/ACT nasal spray Place 2 sprays into both nostrils daily.  Marland Kitchen gabapentin (NEURONTIN) 300 MG capsule   . lisinopril (ZESTRIL) 10 MG tablet Take 1 tablet by mouth once daily  . Omega-3 Fatty Acids (OMEGA 3 PO) Take 1 capsule by mouth daily.  Marland Kitchen senna (SENOKOT) 8.6 MG tablet Take 1 tablet by mouth daily.  . Sildenafil Citrate (VIAGRA PO) Take 1 tablet by mouth as needed.   No facility-administered encounter medications on file as of 12/05/2018.     Activities of Daily Living In your present state of health, do you have any difficulty performing the following activities: 12/05/2018  Hearing? N  Vision? N  Comment new glasses 3 months ago.  Difficulty concentrating or making decisions? N  Walking or climbing stairs? N  Dressing or bathing? N  Doing errands, shopping? N  Preparing Food and eating ? N  Using the Toilet? N  In the past six months, have you accidently leaked urine? N  Do you have problems with loss of bowel control? N  Managing your Medications? N  Managing your Finances? N  Housekeeping or managing your Housekeeping? N  Some recent data might be hidden    Patient Care Team: Saguier, Iris Pert as PCP - General (Physician Assistant) Martinique, Peter M, MD as Consulting Physician (Cardiology) Ralene Bathe, MD as Consulting Physician (Ophthalmology) Puschinsky, Fransico Him., MD as Consulting Physician (Urology)   Assessment:   This is a routine wellness examination for Ruben Henry. Physical assessment deferred to PCP.  Exercise Activities and Dietary recommendations Current Exercise Habits: Home exercise routine, Type of exercise: walking(golf 3x/wk), Frequency (Times/Week): 3,  Exercise limited by: None identified Diet (meal preparation, eat out, water intake, caffeinated beverages, dairy products, fruits and vegetables): Pt reports wife is dietitian and eat a healthy diet. Pt reports he needs to drink more water.    Goals    . Lose 10 lbs    . Weight (lb) < 190 lb (86.2 kg)       Fall Risk Fall Risk  12/05/2018 06/11/2017 11/01/2015  Falls in the past year? 0 No No  Number falls in past  yr: 0 - -  Injury with Fall? 0 - -    Depression Screen PHQ 2/9 Scores 12/05/2018 06/11/2017 11/01/2015  PHQ - 2 Score 0 0 0    Cognitive Function Ad8 score reviewed for issues:  Issues making decisions:no  Less interest in hobbies / activities:no  Repeats questions, stories (family complaining):no  Trouble using ordinary gadgets (microwave, computer, phone):no  Forgets the month or year: no  Mismanaging finances: no  Remembering appts:no Daily problems with thinking and/or memory:no Ad8 score is=0   MMSE - Mini Mental State Exam 11/01/2015  Orientation to time 5  Orientation to Place 5  Registration 3  Attention/ Calculation 5  Recall 3  Language- name 2 objects 2  Language- repeat 1  Language- follow 3 step command 3  Language- read & follow direction 1  Write a sentence 1  Copy design 1  Total score 30        Immunization History  Administered Date(s) Administered  . Influenza, High Dose Seasonal PF 11/01/2015, 01/30/2017, 01/06/2018, 11/15/2018  . Influenza-Unspecified 02/06/2014, 01/30/2017  . Pneumococcal Conjugate-13 06/08/2013  . Pneumococcal Polysaccharide-23 01/27/2012  . Td 02/16/2006  . Tdap 05/04/2016  . Zoster 12/09/2009    Screening Tests Health Maintenance  Topic Date Due  . INFLUENZA VACCINE  09/17/2018  . COLONOSCOPY  12/29/2021  . TETANUS/TDAP  05/05/2026  . Hepatitis C Screening  Completed  . PNA vac Low Risk Adult  Completed     Plan:   See you next year!  Continue to eat heart healthy diet (full of fruits,  vegetables, whole grains, lean protein, water--limit salt, fat, and sugar intake) and increase physical activity as tolerated.  Continue doing brain stimulating activities (puzzles, reading, adult coloring books, staying active) to keep memory sharp.    I have personally reviewed and noted the following in the patient's chart:   . Medical and social history . Use of alcohol, tobacco or illicit drugs  . Current medications and supplements . Functional ability and status . Nutritional status . Physical activity . Advanced directives . List of other physicians . Hospitalizations, surgeries, and ER visits in previous 12 months . Vitals . Screenings to include cognitive, depression, and falls . Referrals and appointments  In addition, I have reviewed and discussed with patient certain preventive protocols, quality metrics, and best practice recommendations. A written personalized care plan for preventive services as well as general preventive health recommendations were provided to patient.    Shela Nevin, [place]  12/05/2018  PCP note- pt states he has chronic cough at night x/1 yr. He has been following w/ Dr.Teoh.   Reviewed and Agree with Assessment & Plan of RN.  Mackie Pai, PA-C

## 2018-12-05 ENCOUNTER — Ambulatory Visit (INDEPENDENT_AMBULATORY_CARE_PROVIDER_SITE_OTHER): Payer: Medicare Other | Admitting: *Deleted

## 2018-12-05 ENCOUNTER — Ambulatory Visit (INDEPENDENT_AMBULATORY_CARE_PROVIDER_SITE_OTHER): Payer: Medicare Other | Admitting: Medical

## 2018-12-05 ENCOUNTER — Encounter: Payer: Self-pay | Admitting: *Deleted

## 2018-12-05 ENCOUNTER — Other Ambulatory Visit: Payer: Self-pay

## 2018-12-05 VITALS — BP 115/73 | HR 77 | Temp 96.9°F | Resp 12 | Ht 70.0 in | Wt 197.2 lb

## 2018-12-05 DIAGNOSIS — R05 Cough: Secondary | ICD-10-CM

## 2018-12-05 DIAGNOSIS — I2510 Atherosclerotic heart disease of native coronary artery without angina pectoris: Secondary | ICD-10-CM | POA: Diagnosis not present

## 2018-12-05 DIAGNOSIS — I10 Essential (primary) hypertension: Secondary | ICD-10-CM

## 2018-12-05 DIAGNOSIS — Z0000 Encounter for general adult medical examination without abnormal findings: Secondary | ICD-10-CM | POA: Diagnosis not present

## 2018-12-05 DIAGNOSIS — E785 Hyperlipidemia, unspecified: Secondary | ICD-10-CM | POA: Diagnosis not present

## 2018-12-05 DIAGNOSIS — R062 Wheezing: Secondary | ICD-10-CM | POA: Diagnosis not present

## 2018-12-05 DIAGNOSIS — R059 Cough, unspecified: Secondary | ICD-10-CM

## 2018-12-05 MED ORDER — LOSARTAN POTASSIUM 25 MG PO TABS: 25.0000 mg | ORAL_TABLET | Freq: Every day | ORAL | 2 refills | Status: DC

## 2018-12-05 MED ORDER — ALBUTEROL SULFATE HFA 108 (90 BASE) MCG/ACT IN AERS: 2.0000 | INHALATION_SPRAY | Freq: Four times a day (QID) | RESPIRATORY_TRACT | 0 refills | Status: DC | PRN

## 2018-12-05 MED ORDER — FLOVENT HFA 110 MCG/ACT IN AERO: 2.0000 | INHALATION_SPRAY | Freq: Two times a day (BID) | RESPIRATORY_TRACT | 0 refills | Status: DC

## 2018-12-05 NOTE — Progress Notes (Signed)
   Subjective:    Patient ID: Ruben Henry, male    DOB: 06-21-1945, 73 y.o.   MRN: [id number]  HPI  Pt in states he has been overall fine except for chronic cough for about one year. Pt has seen 2 ENT. No cause found yet. Pt states cough is mostly at night. Feels like some phlem in throat at time. Pt describes that laryngoscopy was done.   Pt also saw pulmonolgist for this and he states got chrst xray.  Pt has tried flonase and has not helped.  Pt thinks cardiologist wrote lisinopril. Pt never been on losartan.  Pt has no hx of asthma. He often gets wheezing at night.  Pt denies any heart burn.   Review of Systems  Constitutional: Negative for chills, fatigue and fever.  Respiratory: Positive for cough and wheezing. Negative for chest tightness and shortness of breath.   Cardiovascular: Negative for chest pain and palpitations.  Gastrointestinal: Negative for abdominal pain.  Musculoskeletal: Negative for back pain.  Neurological: Negative for dizziness and headaches.  Hematological: Negative for adenopathy. Does not bruise/bleed easily.  Psychiatric/Behavioral: Negative for behavioral problems and confusion.       Objective:   Physical Exam  General- No acute distress. Pleasant patient. Neck- Full range of motion, no jvd Lungs- , even and unlabored. But expiratory wheeze bilateral. Heart- regular rate and rhythm. Neurologic- CNII- XII grossly intact.       Assessment & Plan:  For your history of chronic cough over the last year, I do think it is a good idea for you to stop lisinopril and switch to losartan.  In addition on cough history reviewed and lung exam, I have suspicion that reactive airways is causing her cough at night.  I prescribed you albuterol and Flovent inhaler.  You can use 2 puffs of albuterol prior to sleep.  But would recommend using Flovent daily.  If cost of inhalers are excessive then could give you a short course of tapered prednisone to see  if this impacts wheezing and cough.  Also want you to call back and give you the name of your former pulmonologist.  I would like to refer you back to them.  For history of hypertension and high cholesterol, placed future metabolic panel and fasting lipid panel.  Follow-up date to be determined after lab review as well as depending on when we can get you in with pulmonologist.  25 minutes spent with pt. 50% of time spent counseling pt on plan going forward.

## 2018-12-05 NOTE — Patient Instructions (Addendum)
See you next year!  Continue to eat heart healthy diet (full of fruits, vegetables, whole grains, lean protein, water--limit salt, fat, and sugar intake) and increase physical activity as tolerated.  Continue doing brain stimulating activities (puzzles, reading, adult coloring books, staying active) to keep memory sharp.   Bring a copy of your living will and/or healthcare power of attorney to your next office visit.  Mr. Ruben Henry , Thank you for taking time to come for your Medicare Wellness Visit. I appreciate your ongoing commitment to your health goals. Please review the following plan we discussed and let me know if I can assist you in the future.   These are the goals we discussed: Goals    . DIET - INCREASE WATER INTAKE     Drink 6 glasses of water per day.    . Lose 10 lbs    . Weight (lb) < 190 lb (86.2 kg)       This is a list of the screening recommended for you and due dates:  Health Maintenance  Topic Date Due  . Flu Shot  09/17/2018  . Colon Cancer Screening  12/29/2021  . Tetanus Vaccine  05/05/2026  .  Hepatitis C: One time screening is recommended by Center for Disease Control  (CDC) for  adults born from [birth year] through 1964.   Completed  . Pneumonia vaccines  Completed    Health Maintenance After Age 15 After age 90, you are at a higher risk for certain long-term diseases and infections as well as injuries from falls. Falls are a major cause of broken bones and head injuries in people who are older than age 58. Getting regular preventive care can help to keep you healthy and well. Preventive care includes getting regular testing and making lifestyle changes as recommended by your health care provider. Talk with your health care provider about:  Which screenings and tests you should have. A screening is a test that checks for a disease when you have no symptoms.  A diet and exercise plan that is right for you. What should I know about screenings and tests to  prevent falls? Screening and testing are the best ways to find a health problem early. Early diagnosis and treatment give you the best chance of managing medical conditions that are common after age 3. Certain conditions and lifestyle choices may make you more likely to have a fall. Your health care provider may recommend:  Regular vision checks. Poor vision and conditions such as cataracts can make you more likely to have a fall. If you wear glasses, make sure to get your prescription updated if your vision changes.  Medicine review. Work with your health care provider to regularly review all of the medicines you are taking, including over-the-counter medicines. Ask your health care provider about any side effects that may make you more likely to have a fall. Tell your health care provider if any medicines that you take make you feel dizzy or sleepy.  Osteoporosis screening. Osteoporosis is a condition that causes the bones to get weaker. This can make the bones weak and cause them to break more easily.  Blood pressure screening. Blood pressure changes and medicines to control blood pressure can make you feel dizzy.  Strength and balance checks. Your health care provider may recommend certain tests to check your strength and balance while standing, walking, or changing positions.  Foot health exam. Foot pain and numbness, as well as not wearing proper footwear, can make you  more likely to have a fall.  Depression screening. You may be more likely to have a fall if you have a fear of falling, feel emotionally low, or feel unable to do activities that you used to do.  Alcohol use screening. Using too much alcohol can affect your balance and may make you more likely to have a fall. What actions can I take to lower my risk of falls? General instructions  Talk with your health care provider about your risks for falling. Tell your health care provider if: ? You fall. Be sure to tell your health  care provider about all falls, even ones that seem minor. ? You feel dizzy, sleepy, or off-balance.  Take over-the-counter and prescription medicines only as told by your health care provider. These include any supplements.  Eat a healthy diet and maintain a healthy weight. A healthy diet includes low-fat dairy products, low-fat (lean) meats, and fiber from whole grains, beans, and lots of fruits and vegetables. Home safety  Remove any tripping hazards, such as rugs, cords, and clutter.  Install safety equipment such as grab bars in bathrooms and safety rails on stairs.  Keep rooms and walkways well-lit. Activity   Follow a regular exercise program to stay fit. This will help you maintain your balance. Ask your health care provider what types of exercise are appropriate for you.  If you need a cane or walker, use it as recommended by your health care provider.  Wear supportive shoes that have nonskid soles. Lifestyle  Do not drink alcohol if your health care provider tells you not to drink.  If you drink alcohol, limit how much you have: ? 0-1 drink a day for women. ? 0-2 drinks a day for men.  Be aware of how much alcohol is in your drink. In the [place] one drink equals one typical bottle of beer (12 oz), one-half glass of wine (5 oz), or one shot of hard liquor (1 oz).  Do not use any products that contain nicotine or tobacco, such as cigarettes and e-cigarettes. If you need help quitting, ask your health care provider. Summary  Having a healthy lifestyle and getting preventive care can help to protect your health and wellness after age 72.  Screening and testing are the best way to find a health problem early and help you avoid having a fall. Early diagnosis and treatment give you the best chance for managing medical conditions that are more common for people who are older than age 45.  Falls are a major cause of broken bones and head injuries in people who are older than age  76. Take precautions to prevent a fall at home.  Work with your health care provider to learn what changes you can make to improve your health and wellness and to prevent falls. This information is not intended to replace advice given to you by your health care provider. Make sure you discuss any questions you have with your health care provider. Document Released: 12/16/2016 Document Revised: 05/26/2018 Document Reviewed: 12/16/2016 Elsevier Patient Education  2020 [employer].

## 2018-12-05 NOTE — Patient Instructions (Signed)
For your history of chronic cough over the last year, I do think it is a good idea for you to stop lisinopril and switch to losartan.  In addition on cough history reviewed and lung exam, I have suspicion that reactive airways is causing her cough at night.  I prescribed you albuterol and Flovent inhaler.  You can use 2 puffs of albuterol prior to sleep.  But would recommend using Flovent daily.  If cost of inhalers are excessive then could give you a short course of tapered prednisone to see if this impacts wheezing and cough.  Also want you to call back and give you the name of your former pulmonologist.  I would like to refer you back to them.  For history of hypertension and high cholesterol, placed future metabolic panel and fasting lipid panel.  Follow-up date to be determined after lab review as well as depending on when we can get you in with pulmonologist.

## 2018-12-06 ENCOUNTER — Encounter: Payer: Self-pay | Admitting: Medical

## 2018-12-06 DIAGNOSIS — Z23 Encounter for immunization: Secondary | ICD-10-CM

## 2018-12-06 MED ORDER — EZETIMIBE 10 MG PO TABS: ORAL_TABLET | ORAL | 0 refills | Status: DC

## 2018-12-13 ENCOUNTER — Other Ambulatory Visit (INDEPENDENT_AMBULATORY_CARE_PROVIDER_SITE_OTHER): Payer: Medicare Other

## 2018-12-13 ENCOUNTER — Other Ambulatory Visit: Payer: Self-pay

## 2018-12-13 DIAGNOSIS — I10 Essential (primary) hypertension: Secondary | ICD-10-CM

## 2018-12-13 DIAGNOSIS — E785 Hyperlipidemia, unspecified: Secondary | ICD-10-CM

## 2018-12-13 LAB — COMPREHENSIVE METABOLIC PANEL
ALT: 85 U/L — ABNORMAL HIGH (ref 0–53)
AST: 61 U/L — ABNORMAL HIGH (ref 0–37)
Albumin: 4 g/dL (ref 3.5–5.2)
Alkaline Phosphatase: 99 U/L (ref 39–117)
BUN: 13 mg/dL (ref 6–23)
CO2: 29 mEq/L (ref 19–32)
Calcium: 9.3 mg/dL (ref 8.4–10.5)
Chloride: 100 mEq/L (ref 96–112)
Creatinine, Ser: 0.66 mg/dL (ref 0.40–1.50)
GFR: 118.56 mL/min (ref >60.00)
Glucose, Bld: 114 mg/dL — ABNORMAL HIGH (ref 70–99)
Potassium: 4.2 mEq/L (ref 3.5–5.1)
Sodium: 135 mEq/L (ref 135–145)
Total Bilirubin: 0.9 mg/dL (ref 0.2–1.2)
Total Protein: 6.7 g/dL (ref 6.0–8.3)

## 2018-12-13 LAB — LIPID PANEL
Cholesterol: 208 mg/dL — ABNORMAL HIGH (ref 0–200)
HDL: 63.3 mg/dL (ref >39.00)
LDL Cholesterol: 120 mg/dL — ABNORMAL HIGH (ref 0–99)
NonHDL: 144.31
Total CHOL/HDL Ratio: 3
Triglycerides: 121 mg/dL (ref 0.0–149.0)
VLDL: 24.2 mg/dL (ref 0.0–40.0)

## 2018-12-20 DIAGNOSIS — H6121 Impacted cerumen, right ear: Secondary | ICD-10-CM | POA: Diagnosis not present

## 2018-12-20 DIAGNOSIS — R05 Cough: Secondary | ICD-10-CM | POA: Diagnosis not present

## 2019-01-02 DIAGNOSIS — C61 Malignant neoplasm of prostate: Secondary | ICD-10-CM | POA: Diagnosis not present

## 2019-02-03 ENCOUNTER — Encounter: Payer: Self-pay | Admitting: Medical

## 2019-02-04 ENCOUNTER — Telehealth: Payer: Self-pay | Admitting: Medical

## 2019-02-04 MED ORDER — FLOVENT HFA 110 MCG/ACT IN AERO: 2.0000 | INHALATION_SPRAY | Freq: Two times a day (BID) | RESPIRATORY_TRACT | 3 refills | Status: DC

## 2019-02-04 MED ORDER — ALBUTEROL SULFATE HFA 108 (90 BASE) MCG/ACT IN AERS: 2.0000 | INHALATION_SPRAY | Freq: Four times a day (QID) | RESPIRATORY_TRACT | 0 refills | Status: DC | PRN

## 2019-02-04 MED ORDER — FLOVENT HFA 110 MCG/ACT IN AERO: 2.0000 | INHALATION_SPRAY | Freq: Two times a day (BID) | RESPIRATORY_TRACT | 0 refills | Status: DC

## 2019-02-04 MED ORDER — ALBUTEROL SULFATE HFA 108 (90 BASE) MCG/ACT IN AERS: 2.0000 | INHALATION_SPRAY | Freq: Four times a day (QID) | RESPIRATORY_TRACT | 3 refills | Status: DC | PRN

## 2019-02-04 NOTE — Telephone Encounter (Signed)
Resent inhalers to pharmacy.

## 2019-02-04 NOTE — Telephone Encounter (Signed)
Refilled inhalers 

## 2019-02-07 ENCOUNTER — Telehealth: Payer: Self-pay | Admitting: *Deleted

## 2019-02-07 ENCOUNTER — Telehealth: Payer: Self-pay | Admitting: Medical

## 2019-02-07 MED ORDER — ALBUTEROL SULFATE HFA 108 (90 BASE) MCG/ACT IN AERS: 2.0000 | INHALATION_SPRAY | Freq: Four times a day (QID) | RESPIRATORY_TRACT | 3 refills | Status: DC | PRN

## 2019-02-07 NOTE — Telephone Encounter (Signed)
Contact Type Call Who Is Calling Pharmacy Call Type Pharmacy Send to RN Chief Complaint Paging or Request for Consult Reason for Call Request to clarify medication order Initial Comment Caller states she is calling from [place] and need's clarification. It's the maintenance inhaler. Pro-Air will be paid for by insurance. Additional Comment Pharmacy Name [facility] Pharmacist Name Piru Number [phone] Translation No Nurse Assessment Nurse: Micki Riley, RN, Domenick Gong Date/Time Eilene Ghazi Time): 02/04/2019 9:04:53 AM Please select the assessment type ---Pharmacy clarification Additional Documentation ---[facility] @ [place] [phone] requesting clarification on patient's maintenance inhaler. Pro-Air will be paid for by insurance. No primary on call available until 1pm. Insurance will pay for albuterol, Rn able to authorize change to generic per directives.

## 2019-02-07 NOTE — Telephone Encounter (Signed)
Rx proair sent to pt pharmacy.

## 2019-02-07 NOTE — Telephone Encounter (Signed)
Ins pays for [employer].

## 2019-02-07 NOTE — Telephone Encounter (Signed)
Proair rx sent to pt pharmacy.

## 2019-03-05 ENCOUNTER — Encounter: Payer: Self-pay | Admitting: Medical

## 2019-03-07 DIAGNOSIS — M180 Bilateral primary osteoarthritis of first carpometacarpal joints: Secondary | ICD-10-CM | POA: Diagnosis not present

## 2019-03-07 DIAGNOSIS — M79644 Pain in right finger(s): Secondary | ICD-10-CM | POA: Diagnosis not present

## 2019-03-07 DIAGNOSIS — M79645 Pain in left finger(s): Secondary | ICD-10-CM | POA: Diagnosis not present

## 2019-03-07 MED ORDER — LOSARTAN POTASSIUM 25 MG PO TABS: 25.0000 mg | ORAL_TABLET | Freq: Every day | ORAL | 2 refills | Status: DC

## 2019-03-10 DIAGNOSIS — Z0184 Encounter for antibody response examination: Secondary | ICD-10-CM | POA: Diagnosis not present

## 2019-04-06 NOTE — Progress Notes (Signed)
Ruben Henry Date of Birth: 12/06/1945 Medical Record [id number]  History of Present Illness: Ruben Henry is seen back today for follow up CAD. Has known CAD with remote angioplasty of the RCA and stenting of the LCX with an AVE stent in 1998. 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 He had an ETT in August 2015 that was negative for ischemia. Stress Myoview in July 2018 was low risk. He also has a history of elevated transaminases. He had an abdominal US in August 2015 showing diffuse hepatic steatosis/hepatocellular disease. Followed by Dr. Harlene Ramus and no further work up recommended. He was seen by Pharm D last year to consider a PCSK 9 inhibitor. Did not want to go this route but was willing to consider bempedoic acid once approved.   On follow up today he reports he is feeling well.  He was having a lot of difficulty with SOB and cough. Lisinopril stopped and started on Flovent with almost immediate improvement.  He remains active and walks on the trails and plays golf. Weight is up and down. He  denies any chest pain or SOB. No edema or palpitations. 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  Current Outpatient Medications on File Prior to Visit  Medication Sig Dispense Refill  . albuterol (PROAIR HFA) 108 (90 Base) MCG/ACT inhaler Inhale 2 puffs into the lungs every 6 (six) hours as needed for wheezing or shortness of breath. 18 g 3  . aspirin 81 MG tablet Take 81 mg by mouth every other day.     . ezetimibe (ZETIA) 10 MG tablet Take 1 tablet by mouth once daily 90 tablet 0  . fluticasone (FLONASE) 50 MCG/ACT nasal spray Place 2 sprays into both nostrils daily. 16 g 2  . fluticasone (FLOVENT HFA) 110 MCG/ACT inhaler Inhale 2 puffs into the lungs 2 (two) times daily. Ignore prior rx. Pt wants 3 month supply. 3 Inhaler 3  . losartan (COZAAR) 25 MG tablet Take 1 tablet (25 mg total) by mouth daily. 90 tablet 2  .  Omega-3 Fatty Acids (OMEGA 3 PO) Take 1 capsule by mouth daily.    Marland Kitchen senna (SENOKOT) 8.6 MG tablet Take 1 tablet by mouth daily.     No current facility-administered medications on file prior to visit.    No Known Allergies  Past Medical History:  Diagnosis Date  . 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 CAD (coronary artery disease) 07/20/2013  . Hypercholesterolemia   . Hypertension   . Ischemic heart disease    Prior angioplasty to the RCA with stenting of the LCX in 1998  . Osteoarthritis of right hip   . Prostate cancer [facility])    brachytherapy    Past Surgical History:  Procedure Laterality Date  . CORONARY ANGIOPLASTY WITH STENT PLACEMENT  06/21/1996   prior angioplasty of the RCA with stenting of the LCX in 1998  . EYE SURGERY  1969   Right eye /   . TONSILLECTOMY AND ADENOIDECTOMY      Social History   Tobacco Use  Smoking Status Never Smoker  Smokeless Tobacco Never Used    Social History   Substance and Sexual Activity  Alcohol Use Yes  . Alcohol/week: 14.0 standard drinks  . Types: 14 Cans of beer per week   Comment: couple of beers a day. [employer].    Family History  Problem Relation Age of Onset  .  Cancer Father   . Heart failure Mother   . Diabetes Sister   . Heart attack Maternal Uncle 67  . Coronary artery disease Maternal Uncle 61       with CABG    Review of Systems: The review of systems is per the HPI.  All other systems were reviewed and are negative.  Physical Exam: BP 130/84   Pulse 67   Temp (!) 97.1 F (36.2 C)   Ht 5' 10.5" (1.791 m)   Wt 205 lb (93 kg)   SpO2 95%   BMI 29.00 kg/m  GENERAL:  Well appearing WM in NAD HEENT:  PERRL, EOMI, sclera are clear. Oropharynx is clear. NECK:  No jugular venous distention, carotid upstroke brisk and symmetric, no bruits, no thyromegaly or adenopathy LUNGS:  Clear to auscultation bilaterally CHEST:  Unremarkable HEART:  RRR,  PMI not displaced or sustained,S1 and S2 within  normal limits, no S3, no S4: no clicks, no rubs, no murmurs ABD:  Soft, nontender. BS +, no masses or bruits. No hepatomegaly, no splenomegaly EXT:  2 + pulses throughout, no edema, no cyanosis no clubbing SKIN:  Warm and dry.  No rashes NEURO:  Alert and oriented x 3. Cranial nerves II through XII intact. PSYCH:  Cognitively intact     LABORATORY DATA:   Lab Results  Component Value Date   WBC 6.9 01/29/2007   HGB 9.8 (L) 01/29/2007   HCT 27.6 (L) 01/29/2007   PLT 205 01/29/2007   GLUCOSE 114 (H) 12/13/2018   CHOL 208 (H) 12/13/2018   TRIG 121.0 12/13/2018   HDL 63.30 12/13/2018   LDLDIRECT 106.6 03/24/2012   LDLCALC 120 (H) 12/13/2018   ALT 85 (H) 12/13/2018   AST 61 (H) 12/13/2018   NA 135 12/13/2018   K 4.2 12/13/2018   CL 100 12/13/2018   CREATININE 0.66 12/13/2018   BUN 13 12/13/2018   CO2 29 12/13/2018   INR 0.9 01/24/2007   Dated 12/27/17: potassium and ALT normal. Dated 01/02/19: Normal CMET  Ecg: today- NSR rate 67 bpm. Normal. I have personally reviewed and interpreted this study.  Myoview 09/09/16: Study Highlights   Addendum by Sueanne Margarita, MD on Wed Sep 09, 2016 3:11 PM   The left ventricular ejection fraction is normal (55-65%).  Nuclear stress EF: 57%.  Blood pressure demonstrated a normal response to exercise.  There was 1-34mm of upsloping ST segment depression in the inferolateral leads at peak exercise that resolved immediately in recovery  There is a small defect of mild severity present in the basal inferior and apical inferior location. The defect is non-reversible and c/w diaphragmatic attenuation artifact. No ischemia noted.  This is a low risk study    Finalized by Sueanne Margarita, MD on Wed Sep 09, 2016 3:09 PM   The left ventricular ejection fraction is normal (55-65%).  Nuclear stress EF: 57%.  Blood pressure demonstrated a normal response to exercise.  There was 1-27mm of upsloping ST segment depression in the  inferolateral leads at peak exercise that resolved immediately in recovery  The study is normal.  This is a low risk study.    Assessment / Plan:  1. CAD/ Ischemia heart disease - s/p PTCA of the RCA and stenting of the LCx with DES 1998. He is asymptomatic. Myoview in July 2018 was low risk without ischemia and normal EF. Continue medical management with ASA    2. HTN - blood pressure is well controlled on losartan. Cough  due to ACEi.   3. HLD - on Zetia. Intolerant of statins and red yeast rice due to elevated transaminases.  Will update fasting labs today. Goal LDL < 70.  He did not want to consider PCSK 9 inhibitor before. May be a candidate for bempedoic acid.

## 2019-04-10 ENCOUNTER — Ambulatory Visit (INDEPENDENT_AMBULATORY_CARE_PROVIDER_SITE_OTHER): Payer: Medicare Other | Admitting: Cardiology

## 2019-04-10 ENCOUNTER — Other Ambulatory Visit: Payer: Self-pay

## 2019-04-10 ENCOUNTER — Encounter: Payer: Self-pay | Admitting: Cardiology

## 2019-04-10 VITALS — BP 130/84 | HR 67 | Temp 97.1°F | Ht 70.5 in | Wt 205.0 lb

## 2019-04-10 DIAGNOSIS — E785 Hyperlipidemia, unspecified: Secondary | ICD-10-CM | POA: Diagnosis not present

## 2019-04-10 DIAGNOSIS — I10 Essential (primary) hypertension: Secondary | ICD-10-CM | POA: Diagnosis not present

## 2019-04-10 DIAGNOSIS — I2510 Atherosclerotic heart disease of native coronary artery without angina pectoris: Secondary | ICD-10-CM

## 2019-04-10 LAB — LIPID PANEL
Chol/HDL Ratio: 2.9 ratio (ref 0.0–5.0)
Cholesterol, Total: 184 mg/dL (ref 100–199)
HDL: 64 mg/dL (ref >39)
LDL Chol Calc (NIH): 103 mg/dL — ABNORMAL HIGH (ref 0–99)
Triglycerides: 93 mg/dL (ref 0–149)
VLDL Cholesterol Cal: 17 mg/dL (ref 5–40)

## 2019-04-10 LAB — BASIC METABOLIC PANEL
BUN/Creatinine Ratio: 16 (ref 10–24)
BUN: 13 mg/dL (ref 8–27)
CO2: 22 mmol/L (ref 20–29)
Calcium: 9.3 mg/dL (ref 8.6–10.2)
Chloride: 99 mmol/L (ref 96–106)
Creatinine, Ser: 0.82 mg/dL (ref 0.76–1.27)
GFR calc Af Amer: 102 mL/min/{1.73_m2} (ref >59)
GFR calc non Af Amer: 88 mL/min/{1.73_m2} (ref >59)
Glucose: 97 mg/dL (ref 65–99)
Potassium: 4.3 mmol/L (ref 3.5–5.2)
Sodium: 136 mmol/L (ref 134–144)

## 2019-04-10 LAB — HEPATIC FUNCTION PANEL
ALT: 78 IU/L — ABNORMAL HIGH (ref 0–44)
AST: 61 IU/L — ABNORMAL HIGH (ref 0–40)
Albumin: 4.3 g/dL (ref 3.7–4.7)
Alkaline Phosphatase: 110 IU/L (ref 39–117)
Bilirubin Total: 0.8 mg/dL (ref 0.0–1.2)
Bilirubin, Direct: 0.29 mg/dL (ref 0.00–0.40)
Total Protein: 7.3 g/dL (ref 6.0–8.5)

## 2019-04-11 ENCOUNTER — Other Ambulatory Visit: Payer: Self-pay

## 2019-04-11 DIAGNOSIS — I2510 Atherosclerotic heart disease of native coronary artery without angina pectoris: Secondary | ICD-10-CM

## 2019-04-11 DIAGNOSIS — E785 Hyperlipidemia, unspecified: Secondary | ICD-10-CM

## 2019-04-11 MED ORDER — NEXLETOL 180 MG PO TABS: 1.0000 | ORAL_TABLET | Freq: Every day | ORAL | 3 refills | Status: DC

## 2019-04-11 NOTE — Progress Notes (Signed)
bempedoic

## 2019-06-27 ENCOUNTER — Encounter: Payer: Self-pay | Admitting: Medical

## 2019-06-28 ENCOUNTER — Telehealth: Payer: Self-pay | Admitting: Medical

## 2019-06-28 MED ORDER — EZETIMIBE 10 MG PO TABS
10.0000 mg | ORAL_TABLET | Freq: Every day | ORAL | 3 refills | Status: DC
Start: 2019-06-28 — End: 2019-07-11

## 2019-06-28 NOTE — Telephone Encounter (Signed)
Rx zetia refill sent to pt pharmacy.

## 2019-07-11 ENCOUNTER — Other Ambulatory Visit: Payer: Self-pay

## 2019-07-11 DIAGNOSIS — I2510 Atherosclerotic heart disease of native coronary artery without angina pectoris: Secondary | ICD-10-CM | POA: Diagnosis not present

## 2019-07-11 DIAGNOSIS — C61 Malignant neoplasm of prostate: Secondary | ICD-10-CM | POA: Diagnosis not present

## 2019-07-11 DIAGNOSIS — E785 Hyperlipidemia, unspecified: Secondary | ICD-10-CM | POA: Diagnosis not present

## 2019-07-11 LAB — LIPID PANEL
Chol/HDL Ratio: 2.9 ratio (ref 0.0–5.0)
Cholesterol, Total: 198 mg/dL (ref 100–199)
HDL: 68 mg/dL (ref >39)
LDL Chol Calc (NIH): 112 mg/dL — ABNORMAL HIGH (ref 0–99)
Triglycerides: 103 mg/dL (ref 0–149)
VLDL Cholesterol Cal: 18 mg/dL (ref 5–40)

## 2019-08-03 IMAGING — CT CT MAXILLOFACIAL WITHOUT CONTRAST
3 of 5 series · 15 of 47 positions shown, 18 images · non-contrast
Comparison: None.

CLINICAL DATA: 72-year-old male with sinusitis despite 3 rounds of
antibiotics. Drainage and cough.

EXAM:
CT MAXILLOFACIAL WITHOUT CONTRAST
TECHNIQUE: Multidetector CT images of the paranasal sinuses were obtained using
the standard protocol without intravenous contrast.

[Series 4: sinus 2.00 hr60 s3 cor · coronal · 0.38mm/px · 3 of 106 slices shown]
[im 36/106  bone]
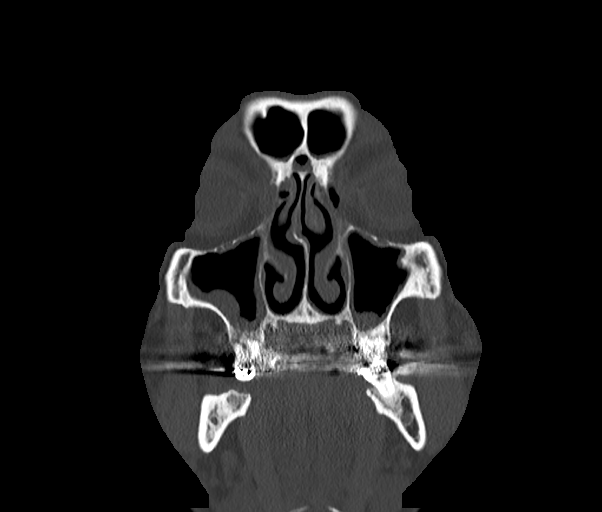
[im 47/106  bone]
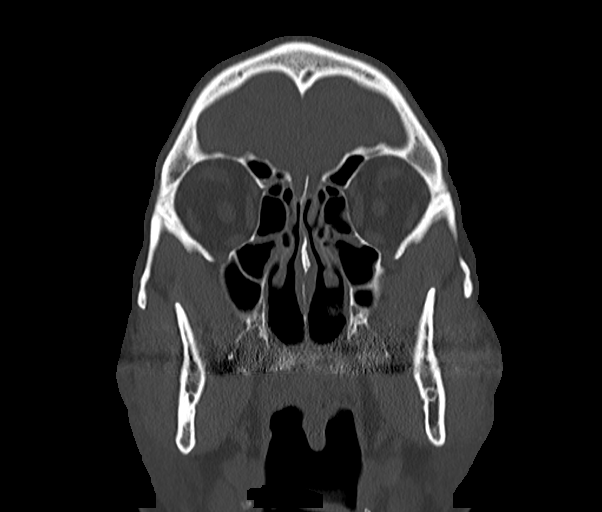
[im 59/106  bone]
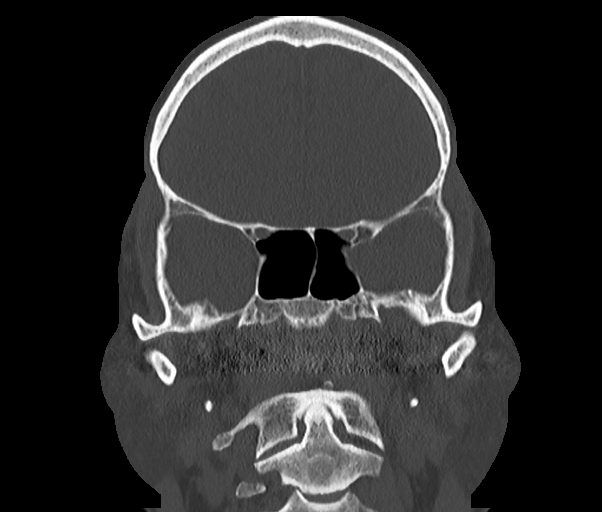

[Series 6: sinus 2.00 hr60 s3 sag · sagittal · 0.38mm/px · 3 of 115 slices shown]
[im 39/115  bone]
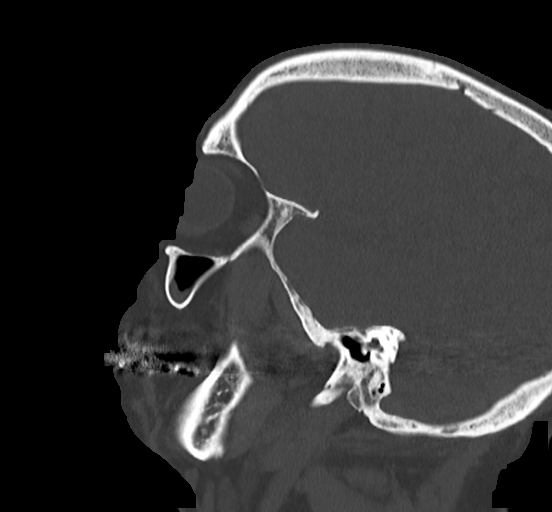
[im 58/115  bone]
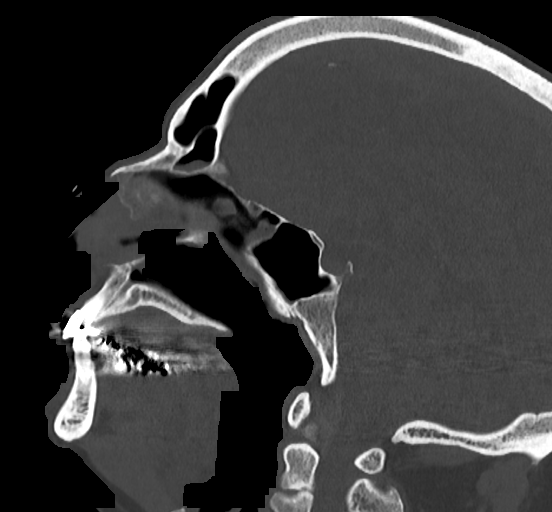
[im 77/115  bone]
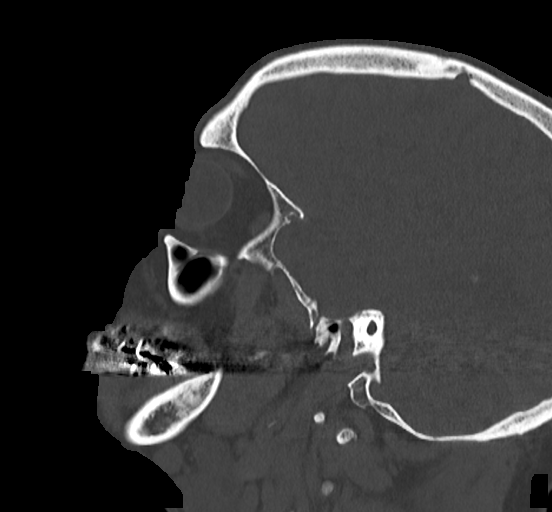

[Series 12: sinus 1.00 hr60 s3 fusion thins · axial · 0.45mm/px · z∈[-568,-406]mm · 9 of 305 slices shown, 12 images]
[im 18/305  brain]
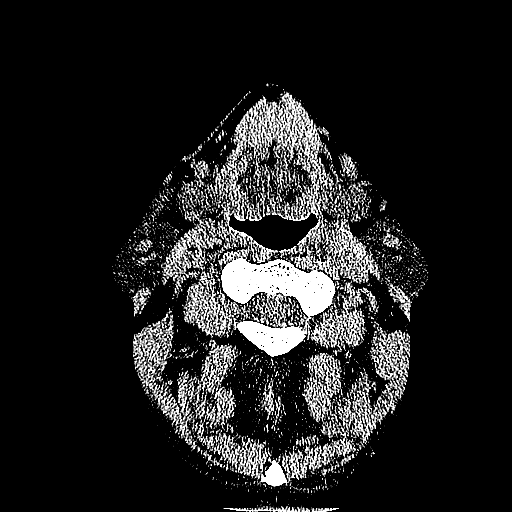
[im 18/305  bone]
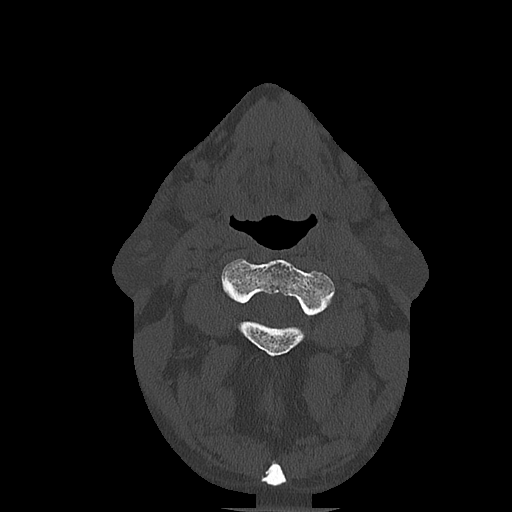
[im 54/305  bone]
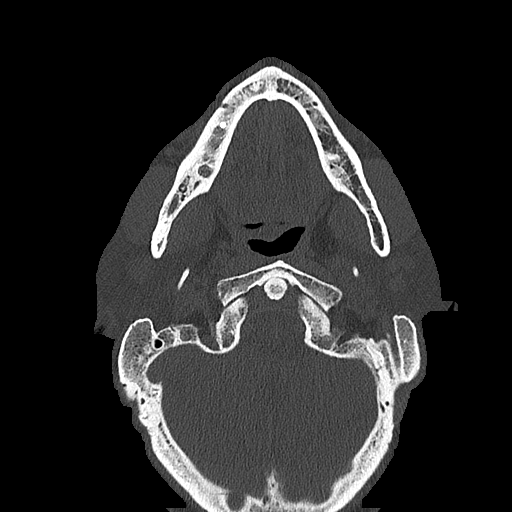
[im 90/305  bone]
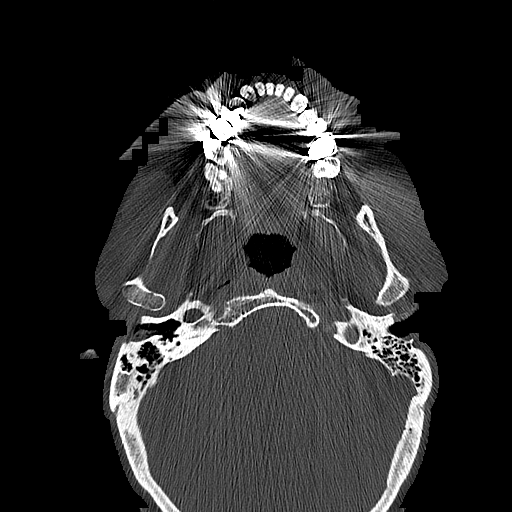
[im 126/305  bone]
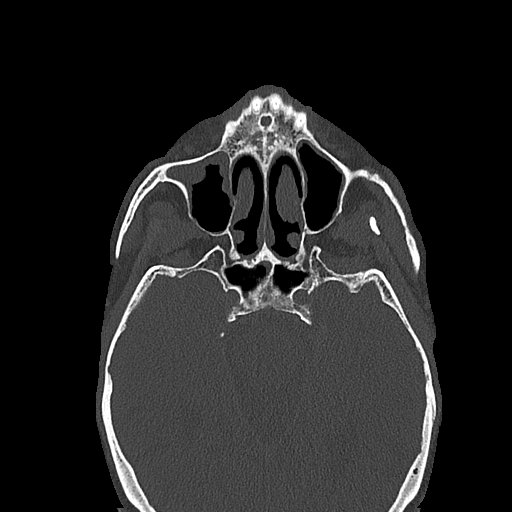
[im 161/305  brain]
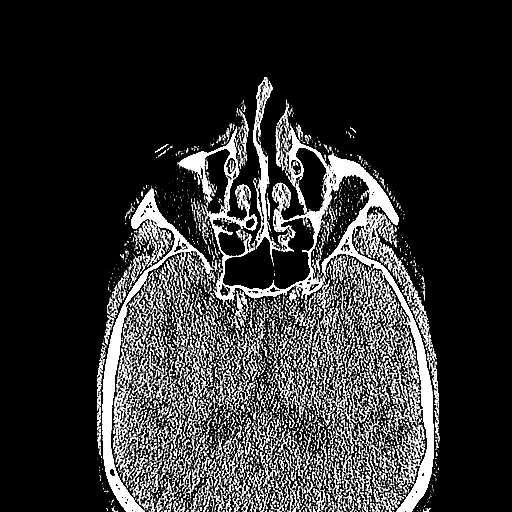
[im 161/305  bone]
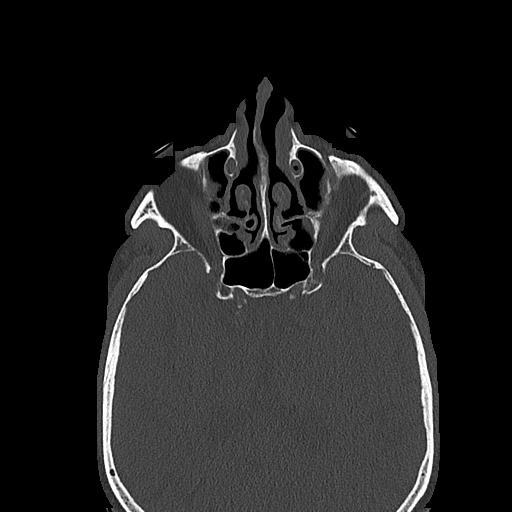
[im 179/305  bone]
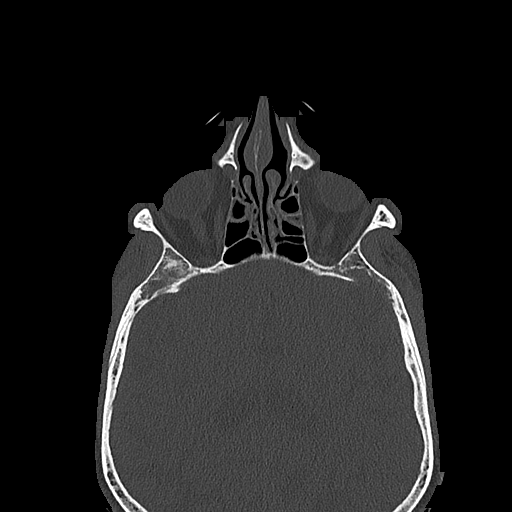
[im 215/305  bone]
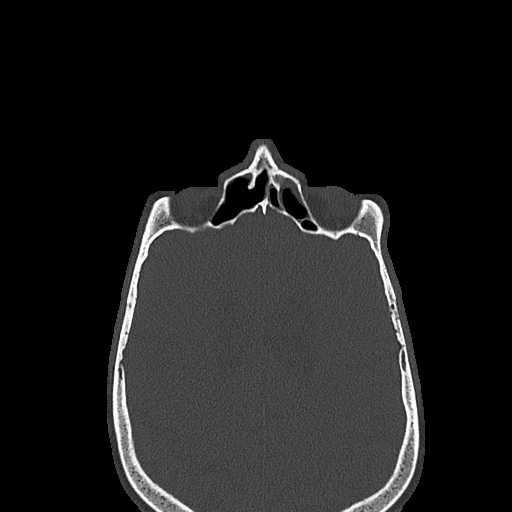
[im 251/305  bone]
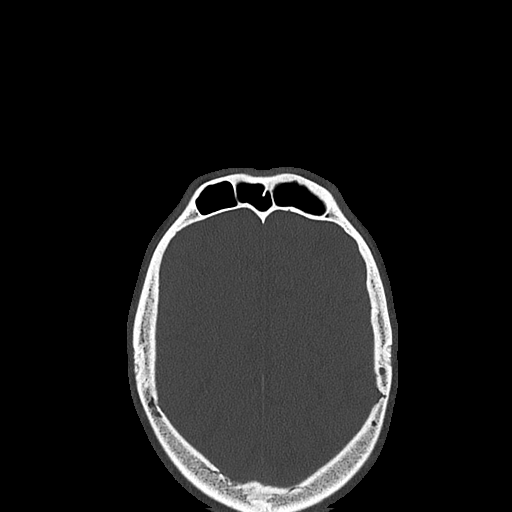
[im 287/305  brain]
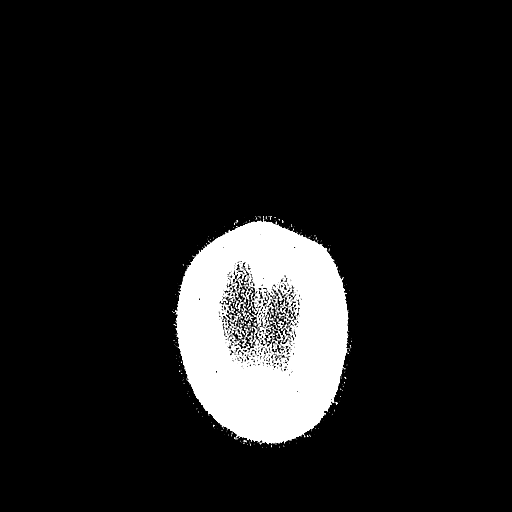
[im 287/305  bone]
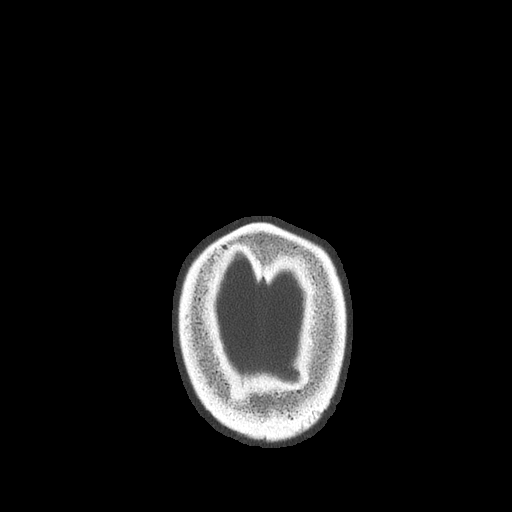

[15 of 47 positions shown; findings below may reference images not displayed]

FINDINGS: Paranasal sinuses:

Frontal: Hyperplastic. Mild-to-moderate mucosal thickening at both
frontoethmoidal recesses.

Ethmoid: Mild to moderate bilateral anterior ethmoid mucosal
thickening. Relatively spared posterior ethmoids.

Maxillary: Mild-to-moderate bilateral circumferential mucosal
thickening, greater on the right.

Sphenoid: Mildly hyperplastic and clear.

Right ostiomeatal unit: Narrow due to mucosal thickening (coronal
image 38).

Left ostiomeatal unit: Patent despite mild mucosal thickening (same
image).

Nasal passages: Rightward anterior nasal septal deviation and
spurring. Intact nasal septum. Symmetric appearing nasal cavity
mucosa. Olfactory recesses are pneumatized. No bubbly opacity or
retained secretions.

Anatomy:

Anterior ethmoidal artery position suspected on coronal image 47
with bilateral adjacent frontal sinus pneumatization.

Keros type 3 olfactory fossa.

Mildly hyperplastic sphenoid sinuses with partial pneumatization of
the anterior clinoid processes (coronal image 59).

Other: Negative visible noncontrast brain parenchyma. Calcified
atherosclerosis at the skull base.

Postoperative changes to the right globe. No acute orbit or scalp
soft tissue finding. Negative visible noncontrast deep soft tissue
spaces of the face.

Bilateral tympanic cavities and mastoids are clear.

No acute dental finding. No acute osseous abnormality identified.
IMPRESSION: 1. Generalized mild to moderate paranasal sinus mucosal thickening,
sparing the posterior ethmoids and sphenoids. No bubbly opacity or
fluid level identified.
2. Hyperplastic paranasal sinuses, no frontal sinus pneumatization
adjacent to the anterior ethmoidal artery positions.
3. Rightward anterior nasal septal deviation and spurring.

## 2019-09-18 ENCOUNTER — Encounter: Payer: Self-pay | Admitting: Medical

## 2019-09-19 ENCOUNTER — Telehealth: Payer: Self-pay | Admitting: Medical

## 2019-09-19 MED ORDER — LEVOCETIRIZINE DIHYDROCHLORIDE 5 MG PO TABS
5.0000 mg | ORAL_TABLET | Freq: Every evening | ORAL | 0 refills | Status: DC
Start: 2019-09-19 — End: 2020-09-09

## 2019-09-19 NOTE — Telephone Encounter (Signed)
Rx xyzal sent to pt pharmacy for sneezing recently.

## 2019-10-18 MED ORDER — LEVOCETIRIZINE DIHYDROCHLORIDE 5 MG PO TABS: 5.0000 mg | ORAL_TABLET | Freq: Every evening | ORAL | 3 refills | Status: DC

## 2019-10-30 ENCOUNTER — Other Ambulatory Visit: Payer: Self-pay

## 2019-10-30 ENCOUNTER — Ambulatory Visit (INDEPENDENT_AMBULATORY_CARE_PROVIDER_SITE_OTHER): Payer: Medicare Other | Admitting: Medical

## 2019-10-30 VITALS — BP 127/74 | HR 74 | Resp 18 | Ht 70.0 in | Wt 206.0 lb

## 2019-10-30 DIAGNOSIS — L989 Disorder of the skin and subcutaneous tissue, unspecified: Secondary | ICD-10-CM | POA: Diagnosis not present

## 2019-10-30 DIAGNOSIS — I2510 Atherosclerotic heart disease of native coronary artery without angina pectoris: Secondary | ICD-10-CM | POA: Diagnosis not present

## 2019-10-30 MED ORDER — MUPIROCIN 2 % EX OINT: TOPICAL_OINTMENT | CUTANEOUS | 0 refills | Status: DC

## 2019-10-30 NOTE — Patient Instructions (Addendum)
For left anterior tibia area small non healing lesion/scab. You can moisturize area twice daily and can apply salter water compresses twice daily. This may debride scab.   If scab fall off then apply mupirocin topical.  But still keep appointment with derm. As they may want to get biopsy.  Follow up as needed  Keep wellness exam upcoming up.

## 2019-10-30 NOTE — Progress Notes (Signed)
   Subjective:    Patient ID: Ruben Henry, male    DOB: 06/27/1945, 74 y.o.   MRN: [id number]  HPI  Pt in with left mid tibia skin lesion. Skin is dried appearance with scab in center. Pt does not remember any trauma area. But he remembers at onset what tiny breakdown/small ulcer. He thinks maybe had only scrape injury. The area never healed. And over 6 week appeared now looks dry with slight raised appearnce and dark black dot.  Review of Systems     Objective:   Physical Exam  General- No acute distress. Pleasant patient. Neck- Full range of motion, no jvd Lungs- Clear, even and unlabored. Heart- regular rate and rhythm. Neurologic- CNII- XII grossly intact. Derm-Left lower ext- mid tibia area are of thickened skin with dark black appearance.,back left side thorax- irregular moderate size mole.      Assessment & Plan:  For left anterior tibia area small non healing lesion/scab. You can moisturize area twice daily and can apply salter water compresses twice daily. This may debride scab.   If scab fall off then apply mupirocin topical.  But still keep appointment with derm. As they may want to get biopsy.  Follow up as needed  Keep wellness exam upcoming up.  Mackie Pai, PA-C

## 2019-11-06 DIAGNOSIS — L570 Actinic keratosis: Secondary | ICD-10-CM | POA: Diagnosis not present

## 2019-11-06 DIAGNOSIS — D485 Neoplasm of uncertain behavior of skin: Secondary | ICD-10-CM | POA: Diagnosis not present

## 2019-11-06 DIAGNOSIS — D1801 Hemangioma of skin and subcutaneous tissue: Secondary | ICD-10-CM | POA: Diagnosis not present

## 2019-11-06 DIAGNOSIS — D225 Melanocytic nevi of trunk: Secondary | ICD-10-CM | POA: Diagnosis not present

## 2019-11-06 DIAGNOSIS — L821 Other seborrheic keratosis: Secondary | ICD-10-CM | POA: Diagnosis not present

## 2019-11-23 ENCOUNTER — Telehealth: Payer: Self-pay | Admitting: Medical

## 2019-11-23 NOTE — Telephone Encounter (Signed)
Copied from [place] [id number]. Topic: Medicare AWV >> Nov 23, 2019  1:39 PM Cher Nakai R wrote: Reason for CRM:  LM to cancel AWVS -will call back to reschedule at a later date-srs

## 2019-12-11 ENCOUNTER — Ambulatory Visit: Payer: Self-pay | Admitting: *Deleted

## 2019-12-19 ENCOUNTER — Encounter: Payer: Self-pay | Admitting: Medical

## 2019-12-20 MED ORDER — LOSARTAN POTASSIUM 25 MG PO TABS: 25.0000 mg | ORAL_TABLET | Freq: Every day | ORAL | 2 refills | Status: DC

## 2020-01-01 DIAGNOSIS — Z23 Encounter for immunization: Secondary | ICD-10-CM | POA: Diagnosis not present

## 2020-01-08 DIAGNOSIS — Z1152 Encounter for screening for COVID-19: Secondary | ICD-10-CM | POA: Diagnosis not present

## 2020-01-10 DIAGNOSIS — M659 Synovitis and tenosynovitis, unspecified: Secondary | ICD-10-CM | POA: Diagnosis not present

## 2020-01-10 DIAGNOSIS — S83241A Other tear of medial meniscus, current injury, right knee, initial encounter: Secondary | ICD-10-CM | POA: Diagnosis not present

## 2020-01-10 DIAGNOSIS — M25561 Pain in right knee: Secondary | ICD-10-CM | POA: Diagnosis not present

## 2020-01-10 DIAGNOSIS — G8929 Other chronic pain: Secondary | ICD-10-CM | POA: Diagnosis not present

## 2020-01-26 DIAGNOSIS — H52202 Unspecified astigmatism, left eye: Secondary | ICD-10-CM | POA: Diagnosis not present

## 2020-01-26 DIAGNOSIS — H2512 Age-related nuclear cataract, left eye: Secondary | ICD-10-CM | POA: Diagnosis not present

## 2020-01-26 DIAGNOSIS — H2701 Aphakia, right eye: Secondary | ICD-10-CM | POA: Diagnosis not present

## 2020-02-05 DIAGNOSIS — C61 Malignant neoplasm of prostate: Secondary | ICD-10-CM | POA: Diagnosis not present

## 2020-02-07 DIAGNOSIS — M659 Synovitis and tenosynovitis, unspecified: Secondary | ICD-10-CM | POA: Diagnosis not present

## 2020-02-07 DIAGNOSIS — S83241A Other tear of medial meniscus, current injury, right knee, initial encounter: Secondary | ICD-10-CM | POA: Diagnosis not present

## 2020-02-26 ENCOUNTER — Encounter: Payer: Self-pay | Admitting: Medical

## 2020-02-26 ENCOUNTER — Telehealth: Payer: Self-pay | Admitting: Medical

## 2020-02-26 NOTE — Telephone Encounter (Signed)
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 My insurance agent claims this plan is accepted by most [facility] providers. [employer] / [facility] is not in the network.  Can someone please confirm if this  plan is accepted?    Above is my chart message pt sent me. Can you check and let him know?

## 2020-03-01 NOTE — Telephone Encounter (Signed)
If the patient's insurance agent is advising the patient it [facility] is not in network, I would definitely rely on that information.  We will always file insurances as a courtesy for patients, but when they come in knowing such things, it is a pretty good bet we are not in network with them.

## 2020-03-01 NOTE — Telephone Encounter (Signed)
Would you mind explaining this to pt.   I would ask he confirm/call the insurance himself.

## 2020-03-06 NOTE — Telephone Encounter (Signed)
Left a message on patient's voice mail asking him to return my call so I can help clarify any questions he has about his insurance coverage.

## 2020-04-01 ENCOUNTER — Telehealth: Payer: Self-pay

## 2020-04-01 NOTE — Telephone Encounter (Signed)
Tier exception denied for ezetimibe (Zetia) 10mg .   Preferred alternatives: Praluent (auto- injector)- will require a PA, colesevelam HCl packet, colesevelam HCl tablet, ezetimibe/simvastatin tablet (quantity limit of 30 every 30 days)   Medications listed above will be more expensive than ezetimibe

## 2020-05-15 NOTE — Progress Notes (Addendum)
Subjective:   Ruben Henry is a 75 y.o. male who presents for Medicare Annual/Subsequent preventive examination.  I connected with Ruben Henry today by telephone and verified that I am speaking with the correct person using two identifiers. Location patient: home Location provider: work Persons participating in the virtual visit: patient, Ruben Henry scientist.    I discussed the limitations, risks, security and privacy concerns of performing an evaluation and management service by telephone and the availability of in person appointments. I also discussed with the patient that there may be a patient responsible charge related to this service. The patient expressed understanding and verbally consented to this telephonic visit.    Interactive audio and video telecommunications were attempted between this provider and patient, however failed, due to patient having technical difficulties OR patient did not have access to video capability.  We continued and completed visit with audio only.  Some vital signs may be absent or patient reported.   Time Spent with patient on telephone encounter: 40 minutes   Review of Systems     Cardiac Risk Factors include: advanced age (>56men, >85 women);male gender;hypertension;dyslipidemia     Objective:    Today's Vitals   05/16/20 0821  Weight: 206 lb (93.4 kg)  Height: 5\' 10"  (1.778 m)   Body mass index is 29.56 kg/m.  Advanced Directives 05/16/2020 12/05/2018 06/11/2017 11/01/2015  Does Patient Have a Medical Advance Directive? No No No Yes  Type of Advance Directive - - - Living will  Copy of [place] in Chart? - - - No - copy requested  Would patient like information on creating a medical advance directive? Yes (MAU/Ambulatory/Procedural Areas - Information given) No - Patient declined No - Patient declined -    Current Medications (verified) Outpatient Encounter Medications as of 05/16/2020  Medication Sig  . albuterol (PROAIR HFA) 108  (90 Base) MCG/ACT inhaler Inhale 2 puffs into the lungs every 6 (six) hours as needed for wheezing or shortness of breath.  Marland Kitchen aspirin 81 MG tablet Take 81 mg by mouth every other day.   . Bempedoic Acid (NEXLETOL) 180 MG TABS Take 1 tablet by mouth daily.  Marland Kitchen ezetimibe (ZETIA) 10 MG tablet Take 1 tablet (10 mg total) by mouth daily.  . fluticasone (FLONASE) 50 MCG/ACT nasal spray Place 2 sprays into both nostrils daily.  . fluticasone (FLOVENT HFA) 110 MCG/ACT inhaler Inhale 2 puffs into the lungs 2 (two) times daily. Ignore prior rx. Pt wants 3 month supply.  Marland Kitchen levocetirizine (XYZAL) 5 MG tablet Take 1 tablet (5 mg total) by mouth every evening.  Marland Kitchen levocetirizine (XYZAL) 5 MG tablet Take 1 tablet (5 mg total) by mouth every evening.  Marland Kitchen losartan (COZAAR) 25 MG tablet Take 1 tablet (25 mg total) by mouth daily.  . mupirocin ointment (BACTROBAN) 2 % Apply thin film to area twice daily.  . Omega-3 Fatty Acids (OMEGA 3 PO) Take 1 capsule by mouth daily.  Marland Kitchen senna (SENOKOT) 8.6 MG tablet Take 1 tablet by mouth daily.   No facility-administered encounter medications on file as of 05/16/2020.    Allergies (verified) Patient has no known allergies.   History: Past Medical History:  Diagnosis Date  . 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 CAD (coronary artery disease) 07/20/2013  . Hypercholesterolemia   . Hypertension   . Ischemic heart disease    Prior angioplasty to the RCA with stenting of the LCX in 1998  . Osteoarthritis of right  hip   . Prostate cancer [facility])    brachytherapy   Past Surgical History:  Procedure Laterality Date  . CORONARY ANGIOPLASTY WITH STENT PLACEMENT  06/21/1996   prior angioplasty of the RCA with stenting of the LCX in 1998  . EYE SURGERY  1969   Right eye /   . TONSILLECTOMY AND ADENOIDECTOMY     Family History  Problem Relation Age of Onset  . Cancer Father   . Heart failure Mother   . Diabetes Sister   . Heart attack Maternal Uncle 67  . Coronary  artery disease Maternal Uncle 61       with CABG   Social History   Socioeconomic History  . Marital status: Married    Spouse name: Not on file  . Number of children: 2  . Years of education: Not on file  . Highest education level: Not on file  Occupational History  . Occupation: Lobbyist: [employer]  Tobacco Use  . Smoking status: Never Smoker  . Smokeless tobacco: Never Used  Vaping Use  . Vaping Use: Never used  Substance and Sexual Activity  . Alcohol use: Yes    Alcohol/week: 14.0 standard drinks    Types: 14 Cans of beer per week    Comment: couple of beers a day. Coors Light.  . Drug use: No  . Sexual activity: Yes  Other Topics Concern  . Not on file  Social History Narrative  . Not on file   Social Determinants of Health   Financial Resource Strain: Low Risk   . Difficulty of Paying Living Expenses: Not hard at all  Food Insecurity: No Food Insecurity  . Worried About Charity fundraiser in the Last Year: Never true  . Ran Out of Food in the Last Year: Never true  Transportation Needs: No Transportation Needs  . Lack of Transportation (Medical): No  . Lack of Transportation (Non-Medical): No  Physical Activity: Sufficiently Active  . Days of Exercise per Week: 5 days  . Minutes of Exercise per Session: 30 min  Stress: No Stress Concern Present  . Feeling of Stress : Not at all  Social Connections: Moderately Integrated  . Frequency of Communication with Friends and Family: More than three times a week  . Frequency of Social Gatherings with Friends and Family: More than three times a week  . Attends Religious Services: Never  . Active Member of Clubs or Organizations: Yes  . Attends Archivist Meetings: More than 4 times per year  . Marital Status: Married    Tobacco Counseling Counseling given: Not Answered   Clinical Intake:  Pre-visit preparation completed: Yes  Pain : No/denies pain     Nutritional Status:  BMI 25 -29 Overweight Nutritional Risks: None Diabetes: No  How often do you need to have someone help you when you read instructions, pamphlets, or other written materials from your doctor or pharmacy?: 1 - Never  Diabetic?No  Interpreter Needed?: No  Information entered by :: Caroleen Hamman LPN   Activities of Daily Living In your present state of health, do you have any difficulty performing the following activities: 05/16/2020  Hearing? N  Vision? N  Difficulty concentrating or making decisions? N  Walking or climbing stairs? N  Dressing or bathing? N  Doing errands, shopping? N  Preparing Food and eating ? N  Using the Toilet? N  In the past six months, have you accidently leaked urine? N  Do you have problems with loss of bowel control? N  Managing your Medications? N  Managing your Finances? N  Housekeeping or managing your Housekeeping? N  Some recent data might be hidden    Patient Care Team: Saguier, Iris Pert as PCP - General (Physician Assistant) Martinique, Peter M, MD as Consulting Physician (Cardiology) Ralene Bathe, MD as Consulting Physician (Ophthalmology) Puschinsky, Fransico Him., MD as Consulting Physician (Urology)  Indicate any recent Medical Services you may have received from other than [facility] providers in the past year (date may be approximate).     Assessment:   This is a routine wellness examination for Hobie.  Hearing/Vision screen  Hearing Screening   125Hz  250Hz  500Hz  1000Hz  2000Hz  3000Hz  4000Hz  6000Hz  8000Hz   Right ear:           Left ear:           Comments: No issues  Vision Screening Comments: Wears glasses Last eye exam-2 months ago-Dr. Delman Cheadle  Dietary issues and exercise activities discussed: Current Exercise Habits: Home exercise routine, Type of exercise: walking;Other - see comments (golf), Time (Minutes): 30, Frequency (Times/Week): 5, Weekly Exercise (Minutes/Week): 150, Intensity: Mild, Exercise limited by: None  identified  Goals    . Lose 10 lbs    . Weight (lb) < 190 lb (86.2 kg)      Depression Screen PHQ 2/9 Scores 05/16/2020 12/05/2018 06/11/2017 11/01/2015  PHQ - 2 Score 0 0 0 0    Fall Risk Fall Risk  05/16/2020 12/05/2018 09/15/2018 06/11/2017 11/01/2015  Falls in the past year? 0 0 (No Data) No No  Comment - - Emmi Telephone Survey: data to providers prior to load - -  Number falls in past yr: 0 0 (No Data) - -  Comment - - Emmi Telephone Survey Actual Response =  - -  Injury with Fall? 0 0 - - -  Follow up Falls prevention discussed - - - -    FALL RISK PREVENTION PERTAINING TO THE HOME:  Any stairs in or around the home? Yes  If so, are there any without handrails? No  Home free of loose throw rugs in walkways, pet beds, electrical cords, etc? Yes  Adequate lighting in your home to reduce risk of falls? Yes   ASSISTIVE DEVICES UTILIZED TO PREVENT FALLS:  Life alert? No  Use of a cane, walker or w/c? No  Grab bars in the bathroom? Yes  Shower chair or bench in shower? No  Elevated toilet seat or a handicapped toilet? No   TIMED UP AND GO:  Was the test performed? No . Phone visit   Cognitive Function:Normal cognitive status assessed by this Nurse Health Advisor. No abnormalities found.   MMSE - Mini Mental State Exam 11/01/2015  Orientation to time 5  Orientation to Place 5  Registration 3  Attention/ Calculation 5  Recall 3  Language- name 2 objects 2  Language- repeat 1  Language- follow 3 step command 3  Language- read & follow direction 1  Write a sentence 1  Copy design 1  Total score 30        Immunizations Immunization History  Administered Date(s) Administered  . Influenza, High Dose Seasonal PF 11/01/2015, 01/30/2017, 01/06/2018, 11/15/2018  . Influenza-Unspecified 01/30/2017, 11/15/2018, 01/01/2020  . Moderna Sars-Covid-2 Vaccination 04/10/2019, 03/18/2020  . Pneumococcal Conjugate-13 06/08/2013  . Pneumococcal Polysaccharide-23 01/27/2012  . Td  02/16/2006  . Tdap 05/04/2016  . Zoster 12/09/2009  . Zoster Recombinat (Shingrix) 02/13/2019, 04/14/2019  TDAP status: Up to date  Flu Vaccine status: Up to date  Pneumococcal vaccine status: Up to date  Covid-19 vaccine status: Information provided on how to obtain vaccines. Booster due  Qualifies for Shingles Vaccine? Yes   Zostavax completed Yes   Shingrix Completed?: No.    Education has been provided regarding the importance of this vaccine. Patient has been advised to call insurance company to determine out of pocket expense if they have not yet received this vaccine. Advised may also receive vaccine at local pharmacy or Health Dept. Verbalized acceptance and understanding.  Screening Tests Health Maintenance  Topic Date Due  . COVID-19 Vaccine (3 - Moderna risk 4-dose series) 04/15/2020  . COLONOSCOPY (Pts 45-56yrs Insurance coverage will need to be confirmed)  12/29/2021  . TETANUS/TDAP  05/05/2026  . INFLUENZA VACCINE  Completed  . Hepatitis C Screening  Completed  . PNA vac Low Risk Adult  Completed  . HPV VACCINES  Aged Out    Health Maintenance  Health Maintenance Due  Topic Date Due  . COVID-19 Vaccine (3 - Moderna risk 4-dose series) 04/15/2020    Colorectal cancer screening: Type of screening: Colonoscopy. Completed 12/29/2016. Repeat every 5 years  Lung Cancer Screening: (Low Dose CT Chest recommended if Age 36-80 years, 30 pack-year currently smoking OR have quit w/in 15years.) does not qualify.     Additional Screening:  Hepatitis C Screening:Completed3/24/2014  Vision Screening: Recommended annual ophthalmology exams for early detection of glaucoma and other disorders of the eye. Is the patient up to date with their annual eye exam?  No  Who is the provider or what is the name of the office in which the patient attends annual eye exams? Dr. Delman Cheadle   Dental Screening: Recommended annual dental exams for proper oral hygiene  Community Resource  Referral / Chronic Care Management: CRR required this visit?  No   CCM required this visit?  No      Plan:     I have personally reviewed and noted the following in the patient's chart:   . Medical and social history . Use of alcohol, tobacco or illicit drugs  . Current medications and supplements . Functional ability and status . Nutritional status . Physical activity . Advanced directives . List of other physicians . Hospitalizations, surgeries, and ER visits in previous 12 months . Vitals . Screenings to include cognitive, depression, and falls . Referrals and appointments  In addition, I have reviewed and discussed with patient certain preventive protocols, quality metrics, and best practice recommendations. A written personalized care plan for preventive services as well as general preventive health recommendations were provided to patient.   Due to this being a telephonic visit, the after visit summary with patients personalized plan was offered to patient via mail or my-chart.  Patient would like to access on my-chart.   Marta Antu, LPN   0/04/[id number]  Nurse Health Advisor  Nurse Notes: None   Mackie Pai, PA-C

## 2020-05-16 ENCOUNTER — Ambulatory Visit (INDEPENDENT_AMBULATORY_CARE_PROVIDER_SITE_OTHER): Payer: Medicare HMO

## 2020-05-16 VITALS — Ht 70.0 in | Wt 206.0 lb

## 2020-05-16 DIAGNOSIS — Z0000 Encounter for general adult medical examination without abnormal findings: Secondary | ICD-10-CM | POA: Diagnosis not present

## 2020-05-16 NOTE — Patient Instructions (Addendum)
Ruben Henry , Thank you for taking time to complete your Medicare Wellness Visit. I appreciate your ongoing commitment to your health goals. Please review the following plan we discussed and let me know if I can assist you in the future.   Screening recommendations/referrals: Colonoscopy: Completed 12/29/2016-Due 12/29/2021 Recommended yearly ophthalmology/optometry visit for glaucoma screening and checkup Recommended yearly dental visit for hygiene and checkup  Vaccinations: Influenza vaccine: Up to date Pneumococcal vaccine: Completed vaccines Tdap vaccine: Up to date-Due-05/04/2016 Shingles vaccine: Completed vaccines  Covid-19: Booster due  Advanced directives: Information mailed today  Conditions/risks identified: See problem  Next appointment: Follow up in one year for your annual wellness visit. 05/22/2021 @ 8:20  Preventive Care 74 Years and Older, Male Preventive care refers to lifestyle choices and visits with your health care provider that can promote health and wellness. What does preventive care include?  A yearly physical exam. This is also called an annual well check.  Dental exams once or twice a year.  Routine eye exams. Ask your health care provider how often you should have your eyes checked.  Personal lifestyle choices, including:  Daily care of your teeth and gums.  Regular physical activity.  Eating a healthy diet.  Avoiding tobacco and drug use.  Limiting alcohol use.  Practicing safe sex.  Taking low doses of aspirin every day.  Taking vitamin and mineral supplements as recommended by your health care provider. What happens during an annual well check? The services and screenings done by your health care provider during your annual well check will depend on your age, overall health, lifestyle risk factors, and family history of disease. Counseling  Your health care provider may ask you questions about your:  Alcohol use.  Tobacco  use.  Drug use.  Emotional well-being.  Home and relationship well-being.  Sexual activity.  Eating habits.  History of falls.  Memory and ability to understand (cognition).  Work and work Statistician. Screening  You may have the following tests or measurements:  Height, weight, and BMI.  Blood pressure.  Lipid and cholesterol levels. These may be checked every 5 years, or more frequently if you are over 44 years old.  Skin check.  Lung cancer screening. You may have this screening every year starting at age 32 if you have a 30-pack-year history of smoking and currently smoke or have quit within the past 15 years.  Fecal occult blood test (FOBT) of the stool. You may have this test every year starting at age 44.  Flexible sigmoidoscopy or colonoscopy. You may have a sigmoidoscopy every 5 years or a colonoscopy every 10 years starting at age 34.  Prostate cancer screening. Recommendations will vary depending on your family history and other risks.  Hepatitis C blood test.  Hepatitis B blood test.  Sexually transmitted disease (STD) testing.  Diabetes screening. This is done by checking your blood sugar (glucose) after you have not eaten for a while (fasting). You may have this done every 1-3 years.  Abdominal aortic aneurysm (AAA) screening. You may need this if you are a current or former smoker.  Osteoporosis. You may be screened starting at age 54 if you are at high risk. Talk with your health care provider about your test results, treatment options, and if necessary, the need for more tests. Vaccines  Your health care provider may recommend certain vaccines, such as:  Influenza vaccine. This is recommended every year.  Tetanus, diphtheria, and acellular pertussis (Tdap, Td) vaccine. You may need  a Td booster every 10 years.  Zoster vaccine. You may need this after age 79.  Pneumococcal 13-valent conjugate (PCV13) vaccine. One dose is recommended after age  37.  Pneumococcal polysaccharide (PPSV23) vaccine. One dose is recommended after age 77. Talk to your health care provider about which screenings and vaccines you need and how often you need them. This information is not intended to replace advice given to you by your health care provider. Make sure you discuss any questions you have with your health care provider. Document Released: 03/01/2015 Document Revised: 10/23/2015 Document Reviewed: 12/04/2014 Elsevier Interactive Patient Education  2017 [place] Prevention in the Home Falls can cause injuries. They can happen to people of all ages. There are many things you can do to make your home safe and to help prevent falls. What can I do on the outside of my home?  Regularly fix the edges of walkways and driveways and fix any cracks.  Remove anything that might make you trip as you walk through a door, such as a raised step or threshold.  Trim any bushes or trees on the path to your home.  Use bright outdoor lighting.  Clear any walking paths of anything that might make someone trip, such as rocks or tools.  Regularly check to see if handrails are loose or broken. Make sure that both sides of any steps have handrails.  Any raised decks and porches should have guardrails on the edges.  Have any leaves, snow, or ice cleared regularly.  Use sand or salt on walking paths during winter.  Clean up any spills in your garage right away. This includes oil or grease spills. What can I do in the bathroom?  Use night lights.  Install grab bars by the toilet and in the tub and shower. Do not use towel bars as grab bars.  Use non-skid mats or decals in the tub or shower.  If you need to sit down in the shower, use a plastic, non-slip stool.  Keep the floor dry. Clean up any water that spills on the floor as soon as it happens.  Remove soap buildup in the tub or shower regularly.  Attach bath mats securely with double-sided  non-slip rug tape.  Do not have throw rugs and other things on the floor that can make you trip. What can I do in the bedroom?  Use night lights.  Make sure that you have a light by your bed that is easy to reach.  Do not use any sheets or blankets that are too big for your bed. They should not hang down onto the floor.  Have a firm chair that has side arms. You can use this for support while you get dressed.  Do not have throw rugs and other things on the floor that can make you trip. What can I do in the kitchen?  Clean up any spills right away.  Avoid walking on wet floors.  Keep items that you use a lot in easy-to-reach places.  If you need to reach something above you, use a strong step stool that has a grab bar.  Keep electrical cords out of the way.  Do not use floor polish or wax that makes floors slippery. If you must use wax, use non-skid floor wax.  Do not have throw rugs and other things on the floor that can make you trip. What can I do with my stairs?  Do not leave any items on the  stairs.  Make sure that there are handrails on both sides of the stairs and use them. Fix handrails that are broken or loose. Make sure that handrails are as long as the stairways.  Check any carpeting to make sure that it is firmly attached to the stairs. Fix any carpet that is loose or worn.  Avoid having throw rugs at the top or bottom of the stairs. If you do have throw rugs, attach them to the floor with carpet tape.  Make sure that you have a light switch at the top of the stairs and the bottom of the stairs. If you do not have them, ask someone to add them for you. What else can I do to help prevent falls?  Wear shoes that:  Do not have high heels.  Have rubber bottoms.  Are comfortable and fit you well.  Are closed at the toe. Do not wear sandals.  If you use a stepladder:  Make sure that it is fully opened. Do not climb a closed stepladder.  Make sure that both  sides of the stepladder are locked into place.  Ask someone to hold it for you, if possible.  Clearly mark and make sure that you can see:  Any grab bars or handrails.  First and last steps.  Where the edge of each step is.  Use tools that help you move around (mobility aids) if they are needed. These include:  Canes.  Walkers.  Scooters.  Crutches.  Turn on the lights when you go into a dark area. Replace any light bulbs as soon as they burn out.  Set up your furniture so you have a clear path. Avoid moving your furniture around.  If any of your floors are uneven, fix them.  If there are any pets around you, be aware of where they are.  Review your medicines with your doctor. Some medicines can make you feel dizzy. This can increase your chance of falling. Ask your doctor what other things that you can do to help prevent falls. This information is not intended to replace advice given to you by your health care provider. Make sure you discuss any questions you have with your health care provider. Document Released: 11/29/2008 Document Revised: 07/11/2015 Document Reviewed: 03/09/2014 Elsevier Interactive Patient Education  2017 [employer].

## 2020-08-13 ENCOUNTER — Encounter: Payer: Self-pay | Admitting: Medical

## 2020-09-09 ENCOUNTER — Telehealth: Payer: Self-pay

## 2020-09-09 MED ORDER — EZETIMIBE 10 MG PO TABS: 10.0000 mg | ORAL_TABLET | Freq: Every day | ORAL | 0 refills | Status: DC

## 2020-09-09 MED ORDER — LOSARTAN POTASSIUM 25 MG PO TABS: 25.0000 mg | ORAL_TABLET | Freq: Every day | ORAL | 0 refills | Status: DC

## 2020-09-09 MED ORDER — LEVOCETIRIZINE DIHYDROCHLORIDE 5 MG PO TABS: 5.0000 mg | ORAL_TABLET | Freq: Every evening | ORAL | 0 refills | Status: DC

## 2020-09-09 NOTE — Telephone Encounter (Signed)
I called patient back and he requested Ezetimibe, Losartan and Levocetirizine to please be sent to [facility].  He stated he is still going to be working with his insurance.  Patient does not want to stop coming to this office so hopefully he can work this issue out.

## 2020-09-09 NOTE — Telephone Encounter (Signed)
Pt called with issues surrounding his insurance coverage.  Pt has Bernadene Person HMO this year which is new to him for 2022.  Pt's assigned PCP on the card is Valle at [facility].  He kept stating he can see "Colby."  He stated he cancelled his appt with [name] on 09/12/20 because he did not want to get an out of network bill.  That appt is for medication refills.  I suggested to pt he follow up with his insurance agent in regards to his plan and he stated there is nothing more the agent can do.  I told the pt then the provider he needs to see is [facility] at [facility].  Pt was very upset and agitated.  He stated well I guess I will just go without my meds then.

## 2020-09-09 NOTE — Telephone Encounter (Signed)
90 tab rx sent in for the meds he is requesting. If he figures insurance issue out and can be seen by our office have him called to get scheduled for follow.    If not then 3 months should be adequate time to establish with other.   Sorry to hear he may have to leave.

## 2020-09-12 ENCOUNTER — Ambulatory Visit: Payer: Medicare HMO | Admitting: Medical

## 2020-10-23 ENCOUNTER — Other Ambulatory Visit: Payer: Self-pay

## 2020-10-23 ENCOUNTER — Encounter: Payer: Self-pay | Admitting: Medical

## 2020-10-23 ENCOUNTER — Ambulatory Visit (INDEPENDENT_AMBULATORY_CARE_PROVIDER_SITE_OTHER): Payer: Medicare Other | Admitting: Medical

## 2020-10-23 VITALS — BP 135/78 | HR 69 | Temp 98.4°F | Resp 18 | Ht 70.0 in | Wt 203.0 lb

## 2020-10-23 DIAGNOSIS — E785 Hyperlipidemia, unspecified: Secondary | ICD-10-CM

## 2020-10-23 DIAGNOSIS — R062 Wheezing: Secondary | ICD-10-CM | POA: Diagnosis not present

## 2020-10-23 DIAGNOSIS — J309 Allergic rhinitis, unspecified: Secondary | ICD-10-CM

## 2020-10-23 DIAGNOSIS — R059 Cough, unspecified: Secondary | ICD-10-CM | POA: Diagnosis not present

## 2020-10-23 DIAGNOSIS — J40 Bronchitis, not specified as acute or chronic: Secondary | ICD-10-CM | POA: Diagnosis not present

## 2020-10-23 DIAGNOSIS — I10 Essential (primary) hypertension: Secondary | ICD-10-CM

## 2020-10-23 MED ORDER — AZITHROMYCIN 250 MG PO TABS: ORAL_TABLET | ORAL | 0 refills | Status: AC

## 2020-10-23 MED ORDER — BENZONATATE 100 MG PO CAPS: 100.0000 mg | ORAL_CAPSULE | Freq: Three times a day (TID) | ORAL | 0 refills | Status: DC | PRN

## 2020-10-23 NOTE — Patient Instructions (Addendum)
Bronchitis with recent productive cough. Rx azithromycin antibiotic and benzonatate for cough.  Hx of cough with causes being allergic rhinitis and reactive airways. Want you to take flonase and xyzal daily. Refilled your flovent inhaler to use daily.   If cough worsens/persist consider hycodan cough syrup.  Htn history. Bp controlled. Continue losartan.   For high cholesterol follow up with your cardilogist.  Follow up 4-6 weeks.

## 2020-10-23 NOTE — Progress Notes (Signed)
Subjective:    Patient ID: Ruben Henry, male    DOB: [birth year], 75 y.o.   MRN: [id number]  HPI  Pt in for follow up.  Pt states he had insurance issues with [employer]. He finally changes to [employer].  Pt recently has history of some cough in the past.   Pt has allergic rhinitis in pst. He is on flonase and xyzal in the past. About one week ago he was having cough more in the day. Around that time cough was moderate. He was bringing up some mucus.   Hx of some wheezing. In the past he notes flovent inhaler use helped and seemed to help his cough.  Hx of htn and is on losartan.  Pt played golf and while playing golf did have mild cough. About one week ago playing golf also coughing a lot.   Review of Systems  Constitutional:  Negative for chills, fatigue and fever.  HENT:  Positive for postnasal drip. Negative for congestion, sinus pressure and sore throat.   Respiratory:  Positive for cough. Negative for chest tightness and stridor.   Cardiovascular:  Negative for chest pain and palpitations.  Gastrointestinal:  Negative for abdominal pain.  Genitourinary:  Negative for dysuria, flank pain and frequency.  Musculoskeletal:  Negative for back pain and myalgias.  Skin:  Negative for rash.   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   CAD (coronary artery disease) 07/20/2013   Hypercholesterolemia    Hypertension    Ischemic heart disease    Prior angioplasty to the RCA with stenting of the LCX in 1998   Osteoarthritis of right hip    Prostate cancer [facility])    brachytherapy     Social History   Socioeconomic History   Marital status: Married    Spouse name: Not on file   Number of children: 2   Years of education: Not on file   Highest education level: Not on file  Occupational History   Occupation: Lobbyist: [employer]  Tobacco Use   Smoking status: Never   Smokeless tobacco: Never  Vaping  Use   Vaping Use: Never used  Substance and Sexual Activity   Alcohol use: Yes    Alcohol/week: 14.0 standard drinks    Types: 14 Cans of beer per week    Comment: couple of beers a day. Coors Light.   Drug use: No   Sexual activity: Yes  Other Topics Concern   Not on file  Social History Narrative   Not on file   Social Determinants of Health   Financial Resource Strain: Low Risk    Difficulty of Paying Living Expenses: Not hard at all  Food Insecurity: No Food Insecurity   Worried About Charity fundraiser in the Last Year: Never true   [place] in the Last Year: Never true  Transportation Needs: No Transportation Needs   Lack of Transportation (Medical): No   Lack of Transportation (Non-Medical): No  Physical Activity: Sufficiently Active   Days of Exercise per Week: 5 days   Minutes of Exercise per Session: 30 min  Stress: No Stress Concern Present   Feeling of Stress : Not at all  Social Connections: Moderately Integrated   Frequency of Communication with Friends and Family: More than three times a week   Frequency of Social Gatherings with Friends and Family: More than three  times a week   Attends Religious Services: Never   Active Member of Clubs or Organizations: Yes   Attends Music therapist: More than 4 times per year   Marital Status: Married  Human resources officer Violence: Not At Risk   Fear of Current or Ex-Partner: No   Emotionally Abused: No   Physically Abused: No   Sexually Abused: No    Past Surgical History:  Procedure Laterality Date   CORONARY ANGIOPLASTY WITH STENT PLACEMENT  06/21/1996   prior angioplasty of the RCA with stenting of the LCX in [place]   Right eye /    TONSILLECTOMY AND ADENOIDECTOMY      Family History  Problem Relation Age of Onset   Cancer Father    Heart failure Mother    Diabetes Sister    Heart attack Maternal Uncle 45   Coronary artery disease Maternal Uncle 61       with CABG     No Known Allergies  Current Outpatient Medications on File Prior to Visit  Medication Sig Dispense Refill   albuterol (PROAIR HFA) 108 (90 Base) MCG/ACT inhaler Inhale 2 puffs into the lungs every 6 (six) hours as needed for wheezing or shortness of breath. 18 g 3   aspirin 81 MG tablet Take 81 mg by mouth every other day.      Bempedoic Acid (NEXLETOL) 180 MG TABS Take 1 tablet by mouth daily. 90 tablet 3   ezetimibe (ZETIA) 10 MG tablet Take 1 tablet (10 mg total) by mouth daily. 90 tablet 0   fluticasone (FLONASE) 50 MCG/ACT nasal spray Place 2 sprays into both nostrils daily. 16 g 2   fluticasone (FLOVENT HFA) 110 MCG/ACT inhaler Inhale 2 puffs into the lungs 2 (two) times daily. Ignore prior rx. Pt wants 3 month supply. 3 Inhaler 3   levocetirizine (XYZAL) 5 MG tablet Take 1 tablet (5 mg total) by mouth every evening. 90 tablet 0   losartan (COZAAR) 25 MG tablet Take 1 tablet (25 mg total) by mouth daily. 90 tablet 0   mupirocin ointment (BACTROBAN) 2 % Apply thin film to area twice daily. 22 g 0   Omega-3 Fatty Acids (OMEGA 3 PO) Take 1 capsule by mouth daily.     senna (SENOKOT) 8.6 MG tablet Take 1 tablet by mouth daily.     No current facility-administered medications on file prior to visit.    BP (!) 146/88   Pulse 69   Temp 98.4 F (36.9 C)   Resp 18   Ht '5\' 10"'$  (1.778 m)   Wt 203 lb (92.1 kg)   SpO2 93%   BMI 29.13 kg/m        Objective:   Physical Exam  General- No acute distress. Pleasant patient. Neck- Full range of motion, no jvd Lungs- Clear, even and unlabored. Heart- regular rate and rhythm. Neurologic- CNII- XII grossly intact.   Heent- sounds mild nasal congested. Mild pnd.        Assessment & Plan:   Patient Instructions  Bronchitis with recent productive cough. Rx azithromycin antibiotic and benzonatate for cough.  Hx of cough with causes being allergic rhinitis and reactive airways. Want you to take flonase and xyzal daily. Refilled  your flovent inhaler to use daily.   If cough worsens/persist consider hycodan cough syrup.  Htn history. Bp controlled. Continue losartan.   For high cholesterol follow up with your cardilogist.  Follow up 4-6 weeks.  Mackie Pai, PA-C

## 2020-12-19 ENCOUNTER — Encounter: Payer: Self-pay | Admitting: Medical

## 2020-12-20 MED ORDER — LOSARTAN POTASSIUM 25 MG PO TABS: 25.0000 mg | ORAL_TABLET | Freq: Every day | ORAL | 0 refills | Status: DC

## 2020-12-20 MED ORDER — ALBUTEROL SULFATE HFA 108 (90 BASE) MCG/ACT IN AERS: 2.0000 | INHALATION_SPRAY | Freq: Four times a day (QID) | RESPIRATORY_TRACT | 3 refills | Status: DC | PRN

## 2020-12-20 MED ORDER — FLUTICASONE PROPIONATE 50 MCG/ACT NA SUSP: 2.0000 | Freq: Every day | NASAL | 2 refills | Status: AC

## 2020-12-20 MED ORDER — LEVOCETIRIZINE DIHYDROCHLORIDE 5 MG PO TABS: 5.0000 mg | ORAL_TABLET | Freq: Every evening | ORAL | 0 refills | Status: DC

## 2020-12-20 MED ORDER — EZETIMIBE 10 MG PO TABS: 10.0000 mg | ORAL_TABLET | Freq: Every day | ORAL | 0 refills | Status: DC

## 2020-12-22 ENCOUNTER — Encounter: Payer: Self-pay | Admitting: Medical

## 2020-12-23 MED ORDER — FLUTICASONE PROPIONATE HFA 110 MCG/ACT IN AERO: 2.0000 | INHALATION_SPRAY | Freq: Two times a day (BID) | RESPIRATORY_TRACT | 3 refills | Status: DC

## 2021-01-10 ENCOUNTER — Encounter: Payer: Self-pay | Admitting: Student

## 2021-01-10 NOTE — Progress Notes (Signed)
Cardiology Office Note:    Date:  01/14/2021   ID:  Ruben Henry, DOB Jul 05, 1945, MRN [id number]  PCP:  Mackie Pai, PA-C  Cardiologist:  Peter Martinique, MD  Electrophysiologist:  None   Referring MD: Mackie Pai, PA-C   Chief Complaint: follow-up of CAD  History of Present Illness:    Ruben Henry is a 75 y.o. male with a history of CAD s/p remote angioplasty of RCA and stenting of the LCX in 1998, hypertension, hyperlipidemia, hepatitis steatosis with elevated transaminases, prostate cancer, and blindness of right eye who is followed by Dr. Martinique and presents today for routine follow-up of CAD.  Patient has a long history of CAD with remote angioplasty of the RCA and stenting of the LCX with an AVE stent in 1998. She has had several stress test since that time. Last Myoview in 08/2016 was low risk with no evidence of ischemia.  Patient was last seen by Dr. Martinique in 03/2019 at which time he was doing well from a cardiac standpoint. He was having trouble with shortness of breath and cough which almost immediately improved after Lisinopril was stopped and he was started on Flovent. He was remaining active by walking on trails and playing golf.   Patient presents today for follow-up. Here alone. Patient overall doing well from a cardiac standpoint. He continues to have some dyspnea on exertion that is stable and improves almost immediately after using his Flovent inhaler. He denies any shortness of breath at rest. No orthopnea, PND, or lower extremity edema. He denies any chest pain. No palpitations, lightheadedness, or dizziness. He also has post-nasal drip and cough that is typically worse in the morning and then improves by mid morning. He was treated for bronchitis in 10/2020. He is staying active - he continues to play golf and walk on the days that he does not play golf. He does report occasional blue discoloration around left ankle which is not new and is stable. He denies any  other discoloration of lower extremities and denies any leg pain with walking suggestive of claudication. He has good distal pulses so do not think this is due to PAD.  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   CAD (coronary artery disease) 07/20/2013   s/p remote PCI of RCA and LCX in 1998   Hypercholesterolemia    Hypertension    Osteoarthritis of right hip    Prostate cancer [facility])    brachytherapy    Past Surgical History:  Procedure Laterality Date   CORONARY ANGIOPLASTY WITH STENT PLACEMENT  06/21/1996   prior angioplasty of the RCA with stenting of the LCX in [place]   Right eye /    TONSILLECTOMY AND ADENOIDECTOMY      Current Medications: Current Meds  Medication Sig   albuterol (PROAIR HFA) 108 (90 Base) MCG/ACT inhaler Inhale 2 puffs into the lungs every 6 (six) hours as needed for wheezing or shortness of breath.   aspirin 81 MG tablet Take 81 mg by mouth every other day.    benzonatate (TESSALON) 100 MG capsule Take 1 capsule (100 mg total) by mouth 3 (three) times daily as needed.   ezetimibe (ZETIA) 10 MG tablet Take 1 tablet (10 mg total) by mouth daily.   fluticasone (FLONASE) 50 MCG/ACT nasal spray Place 2 sprays into both nostrils daily.   fluticasone (FLOVENT HFA) 110 MCG/ACT inhaler Inhale 2  puffs into the lungs 2 (two) times daily. Ignore prior rx. Pt wants 3 month supply.   levocetirizine (XYZAL) 5 MG tablet Take 1 tablet (5 mg total) by mouth every evening.   losartan (COZAAR) 25 MG tablet Take 1 tablet (25 mg total) by mouth daily.   mupirocin ointment (BACTROBAN) 2 % Apply thin film to area twice daily.   Omega-3 Fatty Acids (OMEGA 3 PO) Take 1 capsule by mouth daily.   senna (SENOKOT) 8.6 MG tablet Take 1 tablet by mouth daily.     Allergies:   Patient has no known allergies.   Social History   Socioeconomic History   Marital status: Married    Spouse name: Not on file   Number of  children: 2   Years of education: Not on file   Highest education level: Not on file  Occupational History   Occupation: Lobbyist: [employer]  Tobacco Use   Smoking status: Never   Smokeless tobacco: Never  Vaping Use   Vaping Use: Never used  Substance and Sexual Activity   Alcohol use: Yes    Alcohol/week: 14.0 standard drinks    Types: 14 Cans of beer per week    Comment: couple of beers a day. Coors Light.   Drug use: No   Sexual activity: Yes  Other Topics Concern   Not on file  Social History Narrative   Not on file   Social Determinants of Health   Financial Resource Strain: Low Risk    Difficulty of Paying Living Expenses: Not hard at all  Food Insecurity: No Food Insecurity   Worried About Charity fundraiser in the Last Year: Never true   [place] in the Last Year: Never true  Transportation Needs: No Transportation Needs   Lack of Transportation (Medical): No   Lack of Transportation (Non-Medical): No  Physical Activity: Sufficiently Active   Days of Exercise per Week: 5 days   Minutes of Exercise per Session: 30 min  Stress: No Stress Concern Present   Feeling of Stress : Not at all  Social Connections: Moderately Integrated   Frequency of Communication with Friends and Family: More than three times a week   Frequency of Social Gatherings with Friends and Family: More than three times a week   Attends Religious Services: Never   Marine scientist or Organizations: Yes   Attends Music therapist: More than 4 times per year   Marital Status: Married     Family History: The patient's family history includes Cancer in his father; Coronary artery disease (age of onset: 41) in his maternal uncle; Diabetes in his sister; Heart attack (age of onset: 41) in his maternal uncle; Heart failure in his mother.  ROS:   Please see the history of present illness.     EKGs/Labs/Other Studies Reviewed:    The following  studies were reviewed today:  Myoview 09/09/2016: The left ventricular ejection fraction is normal (55-65%). Nuclear stress EF: 57%. Blood pressure demonstrated a normal response to exercise. There was 1-33mm of upsloping ST segment depression in the inferolateral leads at peak exercise that resolved immediately in recovery There is a small defect of mild severity present in the basal inferior and apical inferior location. The defect is non-reversible and c/w diaphragmatic attenuation artifact. No ischemia noted. This is a low risk study  EKG:  EKG ordered today. EKG personally reviewed and demonstrates normal sinus rhythm, rate 74 bpm,  with PACs and no acute ST/T changes. Normal axis. Normal PR and QRS intervals. QTc 461 ms.  Recent Labs: No results found for requested labs within last 8760 hours.  Recent Lipid Panel    Component Value Date/Time   CHOL 198 07/11/2019 0812   TRIG 103 07/11/2019 0812   HDL 68 07/11/2019 0812   CHOLHDL 2.9 07/11/2019 0812   CHOLHDL 3 12/13/2018 0938   VLDL 24.2 12/13/2018 0938   LDLCALC 112 (H) 07/11/2019 0812   LDLDIRECT 106.6 03/24/2012 0801    Physical Exam:    Vital Signs: BP 138/70 (BP Location: Left Arm, Patient Position: Sitting, Cuff Size: Normal)   Pulse 74   Resp 20   Ht 5\' 10"  (1.778 m)   Wt 203 lb 6.4 oz (92.3 kg)   SpO2 96%   BMI 29.18 kg/m     Wt Readings from Last 3 Encounters:  01/14/21 203 lb 6.4 oz (92.3 kg)  10/23/20 203 lb (92.1 kg)  05/16/20 206 lb (93.4 kg)     General: 75 y.o. Caucasian male in no acute distress. HEENT: Normocephalic and atraumatic. Sclera clear.  Neck: Supple. No carotid bruits. No JVD. Heart:  RRR. Distinct S1 and S2. No murmurs, gallops, or rubs. Radial and posterior tibial pulses 2+ and equal bilaterally. Lungs: No increased work of breathing. A few faint scattered expiratory wheezes noted. No crackles. Abdomen: Soft, non-distended, and non-tender to palpation.  MSK: Normal strength and tone  for age.  Extremities: No lower extremity edema.    Skin: Warm and dry. Neuro: Alert and oriented x3. No focal deficits. Psych: Normal affect. Responds appropriately.  Assessment:    1. Coronary artery disease involving native coronary artery of native heart without angina pectoris   2. Dyspnea on exertion   3. Primary hypertension   4. Hyperlipidemia, unspecified hyperlipidemia type     Plan:    CAD History of remote PCI to RCA and LCX in 1998. Myoview in 2018 was low risk. - No angina. - Continue Aspirin 81mg  daily. - Intolerant to statins and red yeast rice due to elevated transaminases. Continue Zetia 10mg  daily.  Dyspnea on Exertion Patient continues to have dyspnea on exertion which is stable and improves almost immediately with Flovent inhaler. He was treated for bronchitis in 10/2020 but is still having nasal congestion/cough (worse in the mornings and improves throughout the day). - No other signs or symptoms of CHF. Euvolemic on exam. He does have scattered expiratory wheezes noted on exam. - Do not think his dyspnea is cardiac in nature at this time given prompt relief with inhaler. No additional cardiac work-up necessary at this time. Suspect pulmonary in nature. Advised patient to follow-up with PCP if symptoms worsen - he may need maintenance inhaler.  Hypertension BP borderline elevated at 138/70 today. However, patient states systolic BP is usually in the 120s at home. - Continue Losartan 25mg  daily. - Advised patient to continue to monitor BP at home and notify us if consistently above 130/80.  Hyperlipidemia  Most recent lipid panel in 06/2019: Total Cholesterol 198, Triglycerides 103, HDL 68, LDL 112. - LDL goal <70 given CAD. - Intolerant to statins and red yeast rice due to elevated transaminases. Continue Zetia 10mg  daily and Omega-3. He has declined PCSK9 inhibitor in the past and would still like to avoid this. Nexletol 180mg  daily previously prescribed but  patient not taking this due to cost. - Patient not fasting today so he will come back for fasting lipid panel  and CMET within the next couple of weeks. Suspect we will need to get him back into the lipid clinic to discuss options for lowering LDL.   Disposition: Follow up in 1 year with Dr. Martinique.   Medication Adjustments/Labs and Tests Ordered: Current medicines are reviewed at length with the patient today.  Concerns regarding medicines are outlined above.  Orders Placed This Encounter  Procedures   Comprehensive metabolic panel   Lipid panel   EKG 12-Lead   No orders of the defined types were placed in this encounter.   Patient Instructions  Medication Instructions:  Your physician recommends that you continue on your current medications as directed. Please refer to the Current Medication list given to you today.  *If you need a refill on your cardiac medications before your next appointment, please call your pharmacy*  Lab Work: Your physician recommends that you return for lab work AT EARLIEST CONVENIENCE:  CMP Fasting Lipid Panel-DO NOT EAT OR DRINK PAST MIDNIGHT. OKAY TO HAVE WATER. If you have labs (blood work) drawn today and your tests are completely normal, you will receive your results only by: [place] (if you have MyChart) OR A paper copy in the mail If you have any lab test that is abnormal or we need to change your treatment, we will call you to review the results.  Testing/Procedures: NONE ordered at this time of appointment   Follow-Up: At [facility], you and your health needs are our priority.  As part of our continuing mission to provide you with exceptional heart care, we have created designated Provider Care Teams.  These Care Teams include your primary Cardiologist (physician) and Advanced Practice Providers (APPs -  Physician Assistants and Nurse Practitioners) who all work together to provide you with the care you need, when you need  it.  Your next appointment:   1 year(s)  The format for your next appointment:   In Person  Provider:   Peter Martinique, MD    Other Instructions    Signed, Darreld Mclean, PA-C  01/14/2021 10:09 AM    Start

## 2021-01-14 ENCOUNTER — Ambulatory Visit: Payer: Medicare Other | Admitting: Student

## 2021-01-14 ENCOUNTER — Other Ambulatory Visit: Payer: Self-pay

## 2021-01-14 ENCOUNTER — Encounter: Payer: Self-pay | Admitting: Student

## 2021-01-14 VITALS — BP 138/70 | HR 74 | Resp 20 | Ht 70.0 in | Wt 203.4 lb

## 2021-01-14 DIAGNOSIS — E785 Hyperlipidemia, unspecified: Secondary | ICD-10-CM

## 2021-01-14 DIAGNOSIS — I10 Essential (primary) hypertension: Secondary | ICD-10-CM

## 2021-01-14 DIAGNOSIS — I2510 Atherosclerotic heart disease of native coronary artery without angina pectoris: Secondary | ICD-10-CM | POA: Diagnosis not present

## 2021-01-14 DIAGNOSIS — R0609 Other forms of dyspnea: Secondary | ICD-10-CM

## 2021-01-14 NOTE — Patient Instructions (Signed)
Medication Instructions:  Your physician recommends that you continue on your current medications as directed. Please refer to the Current Medication list given to you today.  *If you need a refill on your cardiac medications before your next appointment, please call your pharmacy*  Lab Work: Your physician recommends that you return for lab work AT EARLIEST CONVENIENCE:  CMP Fasting Lipid Panel-DO NOT EAT OR DRINK PAST MIDNIGHT. OKAY TO HAVE WATER. If you have labs (blood work) drawn today and your tests are completely normal, you will receive your results only by: [place] (if you have MyChart) OR A paper copy in the mail If you have any lab test that is abnormal or we need to change your treatment, we will call you to review the results.  Testing/Procedures: NONE ordered at this time of appointment   Follow-Up: At [facility], you and your health needs are our priority.  As part of our continuing mission to provide you with exceptional heart care, we have created designated Provider Care Teams.  These Care Teams include your primary Cardiologist (physician) and Advanced Practice Providers (APPs -  Physician Assistants and Nurse Practitioners) who all work together to provide you with the care you need, when you need it.  Your next appointment:   1 year(s)  The format for your next appointment:   In Person  Provider:   Peter Martinique, MD    Other Instructions

## 2021-01-31 LAB — COMPREHENSIVE METABOLIC PANEL
ALT: 66 IU/L — ABNORMAL HIGH (ref 0–44)
AST: 66 IU/L — ABNORMAL HIGH (ref 0–40)
Albumin/Globulin Ratio: 1.6 (ref 1.2–2.2)
Albumin: 4.6 g/dL (ref 3.7–4.7)
Alkaline Phosphatase: 144 IU/L — ABNORMAL HIGH (ref 44–121)
BUN/Creatinine Ratio: 15 (ref 10–24)
BUN: 12 mg/dL (ref 8–27)
Bilirubin Total: 1.2 mg/dL (ref 0.0–1.2)
CO2: 21 mmol/L (ref 20–29)
Calcium: 9.4 mg/dL (ref 8.6–10.2)
Chloride: 97 mmol/L (ref 96–106)
Creatinine, Ser: 0.82 mg/dL (ref 0.76–1.27)
Globulin, Total: 2.8 g/dL (ref 1.5–4.5)
Glucose: 114 mg/dL — ABNORMAL HIGH (ref 70–99)
Potassium: 4.6 mmol/L (ref 3.5–5.2)
Sodium: 134 mmol/L (ref 134–144)
Total Protein: 7.4 g/dL (ref 6.0–8.5)
eGFR: 92 mL/min/{1.73_m2} (ref >59)

## 2021-01-31 LAB — LIPID PANEL
Chol/HDL Ratio: 2.5 ratio (ref 0.0–5.0)
Cholesterol, Total: 217 mg/dL — ABNORMAL HIGH (ref 100–199)
HDL: 86 mg/dL (ref >39)
LDL Chol Calc (NIH): 116 mg/dL — ABNORMAL HIGH (ref 0–99)
Triglycerides: 85 mg/dL (ref 0–149)
VLDL Cholesterol Cal: 15 mg/dL (ref 5–40)

## 2021-02-06 ENCOUNTER — Other Ambulatory Visit: Payer: Self-pay | Admitting: Medical

## 2021-03-07 ENCOUNTER — Telehealth: Payer: Self-pay

## 2021-03-07 NOTE — Telephone Encounter (Signed)
Pt called asking to see a Pulmonologist, and that his insurance listed [facility] as being in his network. I let him know that I would need to send a message on needing a referral, he says per his insurance he did not need a referral. He says he will call insurance and [place] office and call us back if needed.

## 2021-03-20 NOTE — Progress Notes (Signed)
Synopsis: Referred for chronic cough by Mackie Pai, PA-C  Subjective:   PATIENT ID: Ruben Henry GENDER: male DOB: Aug 15, 1945, MRN: [id number]  Chief Complaint  Patient presents with   Pulmonary Consult     Self referral. Pt c/o cough for several years and was seen here previously Dr Ander Slade- last in 2019. He was recently started on Flovent and this has helped some. He occ will cough up some yellow sputum. He has to clear his throat often.    48yM with history of ACD s/p PCI 1998 and low risk myoview 2018, HTN, history of seed implants for prostate cancer maybe 10 ya, never smoker referred for cough, DOE. Has not had covid-19 infection.   He says he has had coughing at night for years. Usually wakes him up. Feels chest and sinonasal congestion. Sometimes has some associated sneezing. Uses flonase periodically - a few days at a time or maybe everyday over the spring or summer. Is taking xyzal. Not taking montelukast - he is unsure if it was helpful in the past. Does not have any overtly symptomatic reflux. Has never had empiric trial of ppi.   Has improved over last couple of weeks. Has been on flovent 2 puffs twice daily for years. He does think this has been very helpful for cough.   Minimal DOE  Otherwise pertinent review of systems is negative.  He has no family history of lung disease  He worked as an Product manager. He grew up in [place], lived in [place] for a couple years, moved to [place] in 1978. No recent travel. Never smoker. He has no pets at home.   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   CAD (coronary artery disease) 07/20/2013   s/p remote PCI of RCA and LCX in 1998   Hypercholesterolemia    Hypertension    Osteoarthritis of right hip    Prostate cancer ([place])    brachytherapy     Family History  Problem Relation Age of Onset   Heart failure Mother    Esophageal cancer Father        died  age 64, exp to asbestos   Cancer Father    Diabetes Sister    Allergies Sister    Heart attack Maternal Uncle 55   Coronary artery disease Maternal Uncle 61       with CABG     Past Surgical History:  Procedure Laterality Date   CORONARY ANGIOPLASTY WITH STENT PLACEMENT  06/21/1996   prior angioplasty of the RCA with stenting of the LCX in [place]   Right eye /    TONSILLECTOMY AND ADENOIDECTOMY      Social History   Socioeconomic History   Marital status: Married    Spouse name: Not on file   Number of children: 2   Years of education: Not on file   Highest education level: Not on file  Occupational History   Occupation: Lobbyist: [employer]  Tobacco Use   Smoking status: Never   Smokeless tobacco: Never  Vaping Use   Vaping Use: Never used  Substance and Sexual Activity   Alcohol use: Yes    Alcohol/week: 14.0 standard drinks    Types: 14 Cans of beer per week    Comment: couple of beers a day. Coors Light.   Drug use: No  Sexual activity: Yes  Other Topics Concern   Not on file  Social History Narrative   Not on file   Social Determinants of Health   Financial Resource Strain: Low Risk    Difficulty of Paying Living Expenses: Not hard at all  Food Insecurity: No Food Insecurity   Worried About [place] in the Last Year: Never true   [place] in the Last Year: Never true  Transportation Needs: No Transportation Needs   Lack of Transportation (Medical): No   Lack of Transportation (Non-Medical): No  Physical Activity: Sufficiently Active   Days of Exercise per Week: 5 days   Minutes of Exercise per Session: 30 min  Stress: No Stress Concern Present   Feeling of Stress : Not at all  Social Connections: Moderately Integrated   Frequency of Communication with Friends and Family: More than three times a week   Frequency of Social Gatherings with Friends and Family: More than three times a week    Attends Religious Services: Never   Marine scientist or Organizations: Yes   Attends Music therapist: More than 4 times per year   Marital Status: Married  Human resources officer Violence: Not At Risk   Fear of Current or Ex-Partner: No   Emotionally Abused: No   Physically Abused: No   Sexually Abused: No     No Known Allergies   Outpatient Medications Prior to Visit  Medication Sig Dispense Refill   albuterol (PROAIR HFA) 108 (90 Base) MCG/ACT inhaler Inhale 2 puffs into the lungs every 6 (six) hours as needed for wheezing or shortness of breath. 18 g 3   aspirin 81 MG tablet Take 81 mg by mouth every other day.      ezetimibe (ZETIA) 10 MG tablet TAKE 1 TABLET BY MOUTH DAILY 90 tablet 3   fluticasone (FLONASE) 50 MCG/ACT nasal spray Place 2 sprays into both nostrils daily. (Patient taking differently: Place 2 sprays into both nostrils daily as needed.) 16 g 2   fluticasone (FLOVENT HFA) 110 MCG/ACT inhaler Inhale 2 puffs into the lungs 2 (two) times daily. Ignore prior rx. Pt wants 3 month supply. 3 each 3   levocetirizine (XYZAL) 5 MG tablet Take 1 tablet (5 mg total) by mouth every evening. 90 tablet 0   losartan (COZAAR) 25 MG tablet TAKE 1 TABLET BY MOUTH DAILY 90 tablet 3   Omega-3 Fatty Acids (OMEGA 3 PO) Take 1 capsule by mouth daily.     senna (SENOKOT) 8.6 MG tablet Take 1 tablet by mouth daily.     benzonatate (TESSALON) 100 MG capsule Take 1 capsule (100 mg total) by mouth 3 (three) times daily as needed. (Patient not taking: Reported on 03/21/2021) 30 capsule 0   mupirocin ointment (BACTROBAN) 2 % Apply thin film to area twice daily. 22 g 0   No facility-administered medications prior to visit.       Objective:   Physical Exam:  General appearance: 76 y.o., male, NAD, conversant  Eyes: anicteric sclerae; PERRL, tracking appropriately HENT: NCAT; MMM Neck: Trachea midline; no lymphadenopathy, no JVD Lungs: CTAB, no crackles, no wheeze, with normal  respiratory effort CV: RRR, no murmur  Abdomen: Soft, non-tender; non-distended, BS present  Extremities: No peripheral edema, warm Skin: Normal turgor and texture; no rash Psych: Appropriate affect Neuro: Alert and oriented to person and place, no focal deficit     Vitals:   03/21/21 1142  BP: 140/84  Pulse: 74  Temp: 98.1 F (36.7 C)  TempSrc: Oral  SpO2: 99%  Weight: 204 lb 12.8 oz (92.9 kg)  Height: 5' 10.5" (1.791 m)   99% on RA BMI Readings from Last 3 Encounters:  03/21/21 28.97 kg/m  01/14/21 29.18 kg/m  10/23/20 29.13 kg/m   Wt Readings from Last 3 Encounters:  03/21/21 204 lb 12.8 oz (92.9 kg)  01/14/21 203 lb 6.4 oz (92.3 kg)  10/23/20 203 lb (92.1 kg)     CBC    Component Value Date/Time   WBC 6.9 01/29/2007 0405   RBC 2.83 (L) 01/29/2007 0405   HGB 9.8 (L) 01/29/2007 0405   HCT 27.6 (L) 01/29/2007 0405   PLT 205 01/29/2007 0405   MCV 97.7 01/29/2007 0405   MCHC 35.4 01/29/2007 0405   RDW 13.1 01/29/2007 0405   LYMPHSABS 0.9 01/24/2007 0940   MONOABS 0.7 01/24/2007 0940   EOSABS 0.0 (L) 01/24/2007 0940   BASOSABS 0.0 01/24/2007 0940    Eos 0  Chest Imaging: CXR 2019 reviewed by me with 2 metallic foreign bodies on right which are likely migrated brachytherapy beads  CXR today reviewed by me stable with exception of questionable RLL opacity vs rib shadow  Pulmonary Functions Testing Results: No flowsheet data found.      Assessment & Plan:   # Chronic cough Sounds like he's still having frequent PND sensation and he suspects this is primary driver of cough. He does however also believe that flovent has been quite helpful.  Plan: - PFTs next visit in 8 weeks, needs to stop flovent 1 week prior and albuterol 2 days prior to PFT - start flonase 1 spray each nare after clearing nose of crusting following a shower  - can continue flovent 2 puffs BID for now, needs to start rinsing mouth after use - RTC 9 weeks after trip to  [place] - if cough remains unexplained consider CT Chest to evaluate lung parenchyma in case migrated brachytherapy beads could have caused local structural lung damage.        Maryjane Hurter, MD [facility] 03/21/2021 11:57 AM

## 2021-03-21 ENCOUNTER — Encounter: Payer: Self-pay | Admitting: Student

## 2021-03-21 ENCOUNTER — Ambulatory Visit: Payer: Medicare Other | Admitting: Student

## 2021-03-21 ENCOUNTER — Ambulatory Visit (INDEPENDENT_AMBULATORY_CARE_PROVIDER_SITE_OTHER): Payer: Medicare Other

## 2021-03-21 ENCOUNTER — Other Ambulatory Visit: Payer: Self-pay

## 2021-03-21 VITALS — BP 140/84 | HR 74 | Temp 98.1°F | Ht 70.5 in | Wt 204.8 lb

## 2021-03-21 DIAGNOSIS — R053 Chronic cough: Secondary | ICD-10-CM

## 2021-03-21 NOTE — Patient Instructions (Addendum)
-   x ray today - flonase 1 spray each nostril after clearing your nose out of crusting following a shower - breathing tests (PFTs) next visit in 9 weeks on same day as follow up appointment. Need to stop flovent inhaler for 1 week before breathing tests, albuterol for 2 days before breathing tests - see you in 9 weeks!

## 2021-05-02 ENCOUNTER — Other Ambulatory Visit: Payer: Self-pay | Admitting: Medical

## 2021-05-22 ENCOUNTER — Ambulatory Visit: Payer: Medicare HMO

## 2021-05-25 NOTE — Progress Notes (Signed)
? ?Synopsis: Referred for chronic cough by Mackie Pai, PA-C ? ?Subjective:  ? ?PATIENT ID: Ruben Henry GENDER: male DOB: Jun 07, 1945, MRN: [id number] ? ?Chief Complaint  ?Patient presents with  ? Follow-up  ?  PFT's done today. Breathing is doing well and no coughing. He rarely uses his albuterol.   ? ?40yM with history of ACD s/p PCI 1998 and low risk myoview 2018, HTN, history of seed implants for prostate cancer maybe 10 ya, never smoker referred for cough, DOE. Has not had covid-19 infection.  ? ?He says he has had coughing at night for years. Usually wakes him up. Feels chest and sinonasal congestion. Sometimes has some associated sneezing. Uses flonase periodically - a few days at a time or maybe everyday over the spring or summer. Is taking xyzal. Not taking montelukast - he is unsure if it was helpful in the past. Does not have any overtly symptomatic reflux. Has never had empiric trial of ppi.  ? ?Has improved over last couple of weeks. Has been on flovent 2 puffs twice daily for years. He does think this has been very helpful for cough.  ? ?Minimal DOE ? ?He has no family history of lung disease ? ?He worked as an Product manager. He grew up in [place], lived in [place] for a couple years, moved to [place] in 1978. No recent travel. Never smoker. He has no pets at home.  ? ?Interval HPI: ? ?PFTs today with mild obstruction, air trapping, borderline bronchodilator response by FVC. He was off of flovent for a week before PFTs.  ? ?Some increased runny nose.  ? ?No DOE. ? ?Otherwise pertinent review of systems is negative. ? ?Past Medical History:  ?Diagnosis Date  ? Blindness of right eye   ? due to cataract  ? CAD (coronary artery disease) 07/20/2013  ? s/p remote PCI of RCA and LCX in 1998  ? Hypercholesterolemia   ? Hypertension   ? Osteoarthritis of right hip   ? Prostate cancer ([place])   ? brachytherapy  ?  ? ?Family History  ?Problem Relation Age of Onset  ? Heart failure Mother    ? Esophageal cancer Father   ?     died age 56, exp to asbestos  ? Cancer Father   ? Diabetes Sister   ? Allergies Sister   ? Heart attack Maternal Uncle 67  ? Coronary artery disease Maternal Uncle 102  ?     with CABG  ?  ? ?Past Surgical History:  ?Procedure Laterality Date  ? CORONARY ANGIOPLASTY WITH STENT PLACEMENT  06/21/1996  ? prior angioplasty of the RCA with stenting of the LCX in 1998  ? EYE SURGERY  1969  ? Right eye /   ? TONSILLECTOMY AND ADENOIDECTOMY    ? ? ?Social History  ? ?Socioeconomic History  ? Marital status: Married  ?  Spouse name: Not on file  ? Number of children: 2  ? Years of education: Not on file  ? Highest education level: Not on file  ?Occupational History  ? Occupation: Chief Financial Officer  ?  Employer: [employer]  ?Tobacco Use  ? Smoking status: Never  ? Smokeless tobacco: Never  ?Vaping Use  ? Vaping Use: Never used  ?Substance and Sexual Activity  ? Alcohol use: Yes  ?  Alcohol/week: 14.0 standard drinks  ?  Types: 14 Cans of beer per week  ?  Comment: couple of beers a day. Coors Light.  ?  Drug use: No  ? Sexual activity: Yes  ?Other Topics Concern  ? Not on file  ?Social History Narrative  ? Not on file  ? ?Social Determinants of Health  ? ?Financial Resource Strain: Low Risk   ? Difficulty of Paying Living Expenses: Not hard at all  ?Food Insecurity: No Food Insecurity  ? Worried About [place] fundraiser in the Last Year: Never true  ? Ran Out of Food in the Last Year: Never true  ?Transportation Needs: No Transportation Needs  ? Lack of Transportation (Medical): No  ? Lack of Transportation (Non-Medical): No  ?Physical Activity: Sufficiently Active  ? Days of Exercise per Week: 7 days  ? Minutes of Exercise per Session: 60 min  ?Stress: No Stress Concern Present  ? Feeling of Stress : Not at all  ?Social Connections: Moderately Integrated  ? Frequency of Communication with Friends and Family: More than three times a week  ? Frequency of Social Gatherings with Friends and  Family: Three times a week  ? Attends Religious Services: Never  ? Active Member of Clubs or Organizations: Yes  ? Attends Archivist Meetings: More than 4 times per year  ? Marital Status: Married  ?Intimate Partner Violence: Not At Risk  ? Fear of Current or Ex-Partner: No  ? Emotionally Abused: No  ? Physically Abused: No  ? Sexually Abused: No  ?  ? ?No Known Allergies  ? ?Outpatient Medications Prior to Visit  ?Medication Sig Dispense Refill  ? albuterol (VENTOLIN HFA) 108 (90 Base) MCG/ACT inhaler USE 2 INHALATIONS BY MOUTH EVERY 6 HOURS AS NEEDED FOR WHEEZING  OR SHORTNESS OF BREATH 34 g 3  ? aspirin 81 MG tablet Take 81 mg by mouth every other day.     ? ezetimibe (ZETIA) 10 MG tablet TAKE 1 TABLET BY MOUTH DAILY 90 tablet 3  ? fluticasone (FLONASE) 50 MCG/ACT nasal spray Place 2 sprays into both nostrils daily. (Patient taking differently: Place 2 sprays into both nostrils daily as needed.) 16 g 2  ? fluticasone (FLOVENT HFA) 110 MCG/ACT inhaler Inhale 2 puffs into the lungs 2 (two) times daily. Ignore prior rx. Pt wants 3 month supply. 3 each 3  ? losartan (COZAAR) 25 MG tablet TAKE 1 TABLET BY MOUTH DAILY 90 tablet 3  ? Omega-3 Fatty Acids (OMEGA 3 PO) Take 1 capsule by mouth daily.    ? senna (SENOKOT) 8.6 MG tablet Take 1 tablet by mouth daily.    ? levocetirizine (XYZAL) 5 MG tablet Take 1 tablet (5 mg total) by mouth every evening. (Patient not taking: Reported on 05/27/2021) 90 tablet 0  ? ?No facility-administered medications prior to visit.  ? ? ? ? ? ?Objective:  ? ?Physical Exam: ? ?General appearance: 76 y.o., male, NAD, conversant  ?Eyes: anicteric sclerae; PERRL, tracking appropriately ?HENT: NCAT; MMM ?Neck: Trachea midline; no lymphadenopathy, no JVD ?Lungs: CTAB, + expiratory wheeze bl, with normal respiratory effort ?CV: RRR, no murmur  ?Abdomen: Soft, non-tender; non-distended, BS present  ?Extremities: No peripheral edema, warm ?Skin: Normal turgor and texture; no rash ?Psych:  Appropriate affect ?Neuro: Alert and oriented to person and place, no focal deficit  ? ? ? ?Vitals:  ? 05/27/21 1506  ?BP: 132/68  ?Pulse: 92  ?Temp: 98.2 ?F (36.8 ?C)  ?TempSrc: Oral  ?SpO2: 95%  ?Weight: 203 lb (92.1 kg)  ?Height: '5\' 10"'$  (1.778 m)  ? ? ?95% on RA ?BMI Readings from Last 3 Encounters:  ?05/27/21 29.13  kg/m?  ?03/21/21 28.97 kg/m?  ?01/14/21 29.18 kg/m?  ? ?Wt Readings from Last 3 Encounters:  ?05/27/21 203 lb (92.1 kg)  ?03/21/21 204 lb 12.8 oz (92.9 kg)  ?01/14/21 203 lb 6.4 oz (92.3 kg)  ? ? ? ?CBC ?   ?Component Value Date/Time  ? WBC 6.9 01/29/2007 0405  ? RBC 2.83 (L) 01/29/2007 0405  ? HGB 9.8 (L) 01/29/2007 0405  ? HCT 27.6 (L) 01/29/2007 0405  ? PLT 205 01/29/2007 0405  ? MCV 97.7 01/29/2007 0405  ? MCHC 35.4 01/29/2007 0405  ? RDW 13.1 01/29/2007 0405  ? LYMPHSABS 0.9 01/24/2007 0940  ? MONOABS 0.7 01/24/2007 0940  ? EOSABS 0.0 (L) 01/24/2007 0940  ? BASOSABS 0.0 01/24/2007 0940  ? ? ?Eos 0 ? ?Chest Imaging: ?CXR 2019 reviewed by me with 2 metallic foreign bodies on right which are likely migrated brachytherapy beads ? ?CXR 03/23/20 reviewed by me stable with exception of questionable RLL opacity vs rib shadow ? ?Pulmonary Functions Testing Results: ? ?  Latest Ref Rng & Units 05/27/2021  ?  1:55 PM  ?PFT Results  ?FVC-Pre L 3.22  P  ?FVC-Predicted Pre % 75  P  ?FVC-Post L 3.52  P  ?FVC-Predicted Post % 82  P  ?Pre FEV1/FVC % % 64  P  ?Post FEV1/FCV % % 64  P  ?FEV1-Pre L 2.06  P  ?FEV1-Predicted Pre % 66  P  ?FEV1-Post L 2.25  P  ?DLCO uncorrected ml/min/mmHg 28.64  P  ?DLCO UNC% % 113  P  ?DLCO corrected ml/min/mmHg 28.64  P  ?DLCO COR %Predicted % 113  P  ?DLVA Predicted % 127  P  ?TLC L 7.36  P  ?TLC % Predicted % 104  P  ?RV % Predicted % 145  P  ?  ?P Preliminary result  ? ? ? ?   ?Assessment & Plan:  ? ?# Chronic cough ?# Moderate persistent asthma ?# Chronic rhinosinusitis and postnasal draiange ? ?Plan: ?- continue flonase 1 spray each nare after clearing nose of crusting following a  shower  ?- did well on flovent 2 puffs BID, will restart ?- encouraged use of over the counter nonsedating antihistamine like zyrtec or xyzal during pollen season ? ? ?RTC prn ? ? ? ?Maryjane Hurter,

## 2021-05-27 ENCOUNTER — Encounter: Payer: Self-pay | Admitting: Student

## 2021-05-27 ENCOUNTER — Ambulatory Visit: Payer: Medicare Other | Admitting: Student

## 2021-05-27 ENCOUNTER — Ambulatory Visit (INDEPENDENT_AMBULATORY_CARE_PROVIDER_SITE_OTHER): Payer: Medicare Other

## 2021-05-27 ENCOUNTER — Ambulatory Visit (INDEPENDENT_AMBULATORY_CARE_PROVIDER_SITE_OTHER): Payer: Medicare Other | Admitting: Student

## 2021-05-27 ENCOUNTER — Ambulatory Visit: Payer: Medicare Other

## 2021-05-27 VITALS — BP 132/68 | HR 92 | Temp 98.2°F | Ht 70.0 in | Wt 203.0 lb

## 2021-05-27 DIAGNOSIS — J4540 Moderate persistent asthma, uncomplicated: Secondary | ICD-10-CM

## 2021-05-27 DIAGNOSIS — Z0000 Encounter for general adult medical examination without abnormal findings: Secondary | ICD-10-CM

## 2021-05-27 DIAGNOSIS — R0982 Postnasal drip: Secondary | ICD-10-CM | POA: Diagnosis not present

## 2021-05-27 DIAGNOSIS — R053 Chronic cough: Secondary | ICD-10-CM | POA: Diagnosis not present

## 2021-05-27 LAB — PULMONARY FUNCTION TEST
DL/VA % pred: 127 %
DL/VA: 5.09 ml/min/mmHg/L
DLCO cor % pred: 113 %
DLCO cor: 28.64 ml/min/mmHg
DLCO unc % pred: 113 %
DLCO unc: 28.64 ml/min/mmHg
FEF 25-75 Post: 1.64 L/sec
FEF 25-75 Pre: 1.15 L/sec
FEF2575-%Change-Post: 42 %
FEF2575-%Pred-Post: 72 %
FEF2575-%Pred-Pre: 50 %
FEV1-%Change-Post: 8 %
FEV1-%Pred-Post: 72 %
FEV1-%Pred-Pre: 66 %
FEV1-Post: 2.25 L
FEV1-Pre: 2.06 L
FEV1FVC-%Change-Post: 0 %
FEV1FVC-%Pred-Pre: 88 %
FEV6-%Change-Post: 9 %
FEV6-%Pred-Post: 86 %
FEV6-%Pred-Pre: 79 %
FEV6-Post: 3.49 L
FEV6-Pre: 3.2 L
FEV6FVC-%Change-Post: 0 %
FEV6FVC-%Pred-Post: 105 %
FEV6FVC-%Pred-Pre: 105 %
FVC-%Change-Post: 9 %
FVC-%Pred-Post: 82 %
FVC-%Pred-Pre: 75 %
FVC-Post: 3.52 L
FVC-Pre: 3.22 L
Post FEV1/FVC ratio: 64 %
Post FEV6/FVC ratio: 99 %
Pre FEV1/FVC ratio: 64 %
Pre FEV6/FVC Ratio: 99 %
RV % pred: 145 %
RV: 3.68 L
TLC % pred: 104 %
TLC: 7.36 L

## 2021-05-27 NOTE — Patient Instructions (Signed)
Full PFT performed today. °

## 2021-05-27 NOTE — Patient Instructions (Signed)
-   flonase 1 spray each nostril after clearing your nose out of crusting following a shower ?- would start a non-sedating antihistamine like zyrtec or xyzal during pollen season ?- resume flovent 2 puffs twice daily, rinse mouth/gargle after use ?- if flovent inadequate to control cough/wheeze come back to see [place]! ?

## 2021-05-27 NOTE — Progress Notes (Addendum)
? ?Subjective:  ? Ruben Henry is a 76 y.o. male who presents for Medicare Annual/Subsequent preventive examination. ? ?I connected with  [name] on 05/27/21 by a audio enabled telemedicine application and verified that I am speaking with the correct person using two identifiers. ? ?Patient Location: Home ? ?Provider Location: Office/Clinic ? ?I discussed the limitations of evaluation and management by telemedicine. The patient expressed understanding and agreed to proceed.  ? ?Review of Systems    ? ?Cardiac Risk Factors include: advanced age (>61mn, >>58women);hypertension;dyslipidemia ? ?   ?Objective:  ?  ?There were no vitals filed for this visit. ?There is no height or weight on file to calculate BMI. ? ? ?  05/27/2021  ?  8:28 AM 05/16/2020  ?  8:29 AM 12/05/2018  ?  9:41 AM 06/11/2017  ?  2:07 PM 11/01/2015  ? 10:37 AM  ?Advanced Directives  ?Does Patient Have a Medical Advance Directive? Yes No No No Yes  ?Type of Advance Directive Living will    Living will  ?Copy of HTownsendin Chart?     No - copy requested  ?Would patient like information on creating a medical advance directive? No - Patient declined Yes (MAU/Ambulatory/Procedural Areas - Information given) No - Patient declined No - Patient declined   ? ? ?Current Medications (verified) ?Outpatient Encounter Medications as of 05/27/2021  ?Medication Sig  ? albuterol (VENTOLIN HFA) 108 (90 Base) MCG/ACT inhaler USE 2 INHALATIONS BY MOUTH EVERY 6 HOURS AS NEEDED FOR WHEEZING  OR SHORTNESS OF BREATH  ? aspirin 81 MG tablet Take 81 mg by mouth every other day.   ? ezetimibe (ZETIA) 10 MG tablet TAKE 1 TABLET BY MOUTH DAILY  ? fluticasone (FLONASE) 50 MCG/ACT nasal spray Place 2 sprays into both nostrils daily. (Patient taking differently: Place 2 sprays into both nostrils daily as needed.)  ? fluticasone (FLOVENT HFA) 110 MCG/ACT inhaler Inhale 2 puffs into the lungs 2 (two) times daily. Ignore prior rx. Pt wants 3 month  supply.  ? levocetirizine (XYZAL) 5 MG tablet Take 1 tablet (5 mg total) by mouth every evening.  ? losartan (COZAAR) 25 MG tablet TAKE 1 TABLET BY MOUTH DAILY  ? Omega-3 Fatty Acids (OMEGA 3 PO) Take 1 capsule by mouth daily.  ? senna (SENOKOT) 8.6 MG tablet Take 1 tablet by mouth daily.  ? ?No facility-administered encounter medications on file as of 05/27/2021.  ? ? ?Allergies (verified) ?Patient has no known allergies.  ? ?History: ?Past Medical History:  ?Diagnosis Date  ? Blindness of right eye   ? due to cataract  ? CAD (coronary artery disease) 07/20/2013  ? s/p remote PCI of RCA and LCX in 1998  ? Hypercholesterolemia   ? Hypertension   ? Osteoarthritis of right hip   ? [place]state [place]   ? brachytherapy  ? ?Past Surgical History:  ?Procedure Laterality Date  ? CORONARY ANGIOPLASTY WITH STENT PLACEMENT  06/21/1996  ? prior angioplasty of the RCA with stenting of the LCX in 1998  ? EYE SURGERY  1969  ? Right eye /   ? TONSILLECTOMY AND ADENOIDECTOMY    ? ?Family History  ?Problem Relation Age of Onset  ? Heart failure Mother   ? Esophageal cancer Father   ?     died age 75 exp to asbestos  ? Cancer Father   ? Diabetes Sister   ? Allergies Sister   ? Heart attack Maternal Uncle 67  ?  Coronary artery disease Maternal Uncle 69  ?     with CABG  ? ?Social History  ? ?Socioeconomic History  ? Marital status: Married  ?  Spouse name: Not on file  ? Number of children: 2  ? Years of education: Not on file  ? Highest education level: Not on file  ?Occupational History  ? Occupation: Chief Financial Officer  ?  Employer: [employer]  ?Tobacco Use  ? Smoking status: Never  ? Smokeless tobacco: Never  ?Vaping Use  ? Vaping Use: Never used  ?Substance and Sexual Activity  ? Alcohol use: Yes  ?  Alcohol/week: 14.0 standard drinks  ?  Types: 14 Cans of beer per week  ?  Comment: couple of beers a day. [employer].  ? Drug use: No  ? Sexual activity: Yes  ?Other Topics Concern  ? Not on file  ?Social History Narrative  ?  Not on file  ? ?Social Determinants of Health  ? ?Financial Resource Strain: Low Risk   ? Difficulty of Paying Living Expenses: Not hard at all  ?Food Insecurity: No Food Insecurity  ? Worried About Charity fundraiser in the Last Year: Never true  ? Ran Out of Food in the Last Year: Never true  ?Transportation Needs: No Transportation Needs  ? Lack of Transportation (Medical): No  ? Lack of Transportation (Non-Medical): No  ?Physical Activity: Sufficiently Active  ? Days of Exercise per Week: 7 days  ? Minutes of Exercise per Session: 60 min  ?Stress: No Stress Concern Present  ? Feeling of Stress : Not at all  ?Social Connections: Moderately Integrated  ? Frequency of Communication with Friends and Family: More than three times a week  ? Frequency of Social Gatherings with Friends and Family: Three times a week  ? Attends Religious Services: Never  ? Active Member of Clubs or Organizations: Yes  ? Attends Archivist Meetings: More than 4 times per year  ? Marital Status: Married  ? ? ?Tobacco Counseling ?Counseling given: Not Answered ? ? ?Clinical Intake: ? ?Pre-visit preparation completed: Yes ? ?Pain : No/denies pain ? ?  ? ?Nutritional Risks: None ?Diabetes: No ? ?How often do you need to have someone help you when you read instructions, pamphlets, or other written materials from your doctor or pharmacy?: 1 - Never ? ?Diabetic?No ? ?Interpreter Needed?: No ? ?Information entered by :: Elfie Costanza ? ? ?Activities of Daily Living ? ?  05/27/2021  ?  8:32 AM  ?In your present state of health, do you have any difficulty performing the following activities:  ?Hearing? 0  ?Vision? 0  ?Difficulty concentrating or making decisions? 0  ?Walking or climbing stairs? 0  ?Dressing or bathing? 0  ?Doing errands, shopping? 0  ?Preparing Food and eating ? N  ?Using the Toilet? N  ?In the past six months, have you accidently leaked urine? N  ?Do you have problems with loss of bowel control? N  ?Managing your  Medications? N  ?Managing your Finances? N  ?Housekeeping or managing your Housekeeping? N  ? ? ?Patient Care Team: ?Saguier, Iris Pert as PCP - General (Physician Assistant) ?Martinique, Peter M, MD as PCP - Cardiology (Cardiology) ?Martinique, Peter M, MD as Consulting Physician (Cardiology) ?Ralene Bathe, MD as Consulting Physician (Ophthalmology) ?Puschinsky, Fransico Him., MD as Consulting Physician (Urology) ? ?Indicate any recent Medical Services you may have received from other than [facility] providers in the past year (date may be approximate). ? ?   ?  Assessment:  ? This is a routine wellness examination for Khoa. ? ?Hearing/Vision screen ?No results found. ? ?Dietary issues and exercise activities discussed: ?Current Exercise Habits: Home exercise routine, Type of exercise: walking;strength training/weights, Time (Minutes): 60, Frequency (Times/Week): 7, Weekly Exercise (Minutes/Week): 420, Intensity: Mild, Exercise limited by: None identified ? ? Goals Addressed   ? ?  ?  ?  ?  ? This Visit's Progress  ?  Lose 10 lbs   Not on track  ? ?  ? ?Depression Screen ? ?  05/27/2021  ?  8:29 AM 05/16/2020  ?  8:36 AM 12/05/2018  ?  9:42 AM 06/11/2017  ?  2:08 PM 11/01/2015  ? 10:38 AM  ?PHQ 2/9 Scores  ?PHQ - 2 Score 0 0 0 0 0  ?  ?Fall Risk ? ?  05/27/2021  ?  8:29 AM 05/16/2020  ?  8:34 AM 12/05/2018  ?  9:42 AM 09/15/2018  ?  6:51 PM 06/11/2017  ?  2:08 PM  ?Fall Risk   ?Falls in the past year? 0 0 0  No  ?Comment    Emmi Telephone Survey: data to providers prior to load   ?Number falls in past yr: 0 0 0    ?Comment    Emmi Telephone Survey Actual Response =    ?Injury with Fall? 0 0 0    ?Risk for fall due to : No Fall Risks      ?Follow up Falls evaluation completed Falls prevention discussed     ? ? ?FALL RISK PREVENTION PERTAINING TO THE HOME: ? ?Any stairs in or around the home? Yes  ?If so, are there any without handrails? No  ?Home free of loose throw rugs in walkways, pet beds, electrical cords, etc? Yes  ?Adequate  lighting in your home to reduce risk of falls? Yes  ? ?ASSISTIVE DEVICES UTILIZED TO PREVENT FALLS: ? ?Life alert? No ?Use of a cane, walker or w/c? No  ?Grab bars in the bathroom? No  ?Shower chair or bench in show

## 2021-05-27 NOTE — Patient Instructions (Signed)
Mr. Boyajian , ?Thank you for taking time to come for your Medicare Wellness Visit. I appreciate your ongoing commitment to your health goals. Please review the following plan we discussed and let me know if I can assist you in the future.  ? ?Screening recommendations/referrals: ?Colonoscopy: 12/29/16 due 12/29/21 ?Recommended yearly ophthalmology/optometry visit for glaucoma screening and checkup ?Recommended yearly dental visit for hygiene and checkup ? ?Vaccinations: ?Influenza vaccine: up to date ?Pneumococcal vaccine: up to date ?Tdap vaccine: up to date ?Shingles vaccine: up to date   ?Covid-19: declined ? ?Advanced directives: yes, not on file ? ?Conditions/risks identified: see problem list ? ?Next appointment: Follow up in one year for your annual wellness visit. 05/28/22 ? ?Preventive Care 27 Years and Older, Male ?Preventive care refers to lifestyle choices and visits with your health care provider that can promote health and wellness. ?What does preventive care include? ?A yearly physical exam. This is also called an annual well check. ?Dental exams once or twice a year. ?Routine eye exams. Ask your health care provider how often you should have your eyes checked. ?Personal lifestyle choices, including: ?Daily care of your teeth and gums. ?Regular physical activity. ?Eating a healthy diet. ?Avoiding tobacco and drug use. ?Limiting alcohol use. ?Practicing safe sex. ?Taking low doses of aspirin every day. ?Taking vitamin and mineral supplements as recommended by your health care provider. ?What happens during an annual well check? ?The services and screenings done by your health care provider during your annual well check will depend on your age, overall health, lifestyle risk factors, and family history of disease. ?Counseling  ?Your health care provider may ask you questions about your: ?Alcohol use. ?Tobacco use. ?Drug use. ?Emotional well-being. ?Home and relationship well-being. ?Sexual  activity. ?Eating habits. ?History of falls. ?Memory and ability to understand (cognition). ?Work and work Statistician. ?Screening  ?You may have the following tests or measurements: ?Height, weight, and BMI. ?Blood pressure. ?Lipid and cholesterol levels. These may be checked every 5 years, or more frequently if you are over 41 years old. ?Skin check. ?Lung cancer screening. You may have this screening every year starting at age 22 if you have a 30-pack-year history of smoking and currently smoke or have quit within the past 15 years. ?Fecal occult blood test (FOBT) of the stool. You may have this test every year starting at age 74. ?Flexible sigmoidoscopy or colonoscopy. You may have a sigmoidoscopy every 5 years or a colonoscopy every 10 years starting at age 21. ?Prostate cancer screening. Recommendations will vary depending on your family history and other risks. ?Hepatitis C blood test. ?Hepatitis B blood test. ?Sexually transmitted disease (STD) testing. ?Diabetes screening. This is done by checking your blood sugar (glucose) after you have not eaten for a while (fasting). You may have this done every 1-3 years. ?Abdominal aortic aneurysm (AAA) screening. You may need this if you are a current or former smoker. ?Osteoporosis. You may be screened starting at age 2 if you are at high risk. ?Talk with your health care provider about your test results, treatment options, and if necessary, the need for more tests. ?Vaccines  ?Your health care provider may recommend certain vaccines, such as: ?Influenza vaccine. This is recommended every year. ?Tetanus, diphtheria, and acellular pertussis (Tdap, Td) vaccine. You may need a Td booster every 10 years. ?Zoster vaccine. You may need this after age 5. ?Pneumococcal 13-valent conjugate (PCV13) vaccine. One dose is recommended after age 49. ?Pneumococcal polysaccharide (PPSV23) vaccine. One dose is recommended  after age 75. ?Talk to your health care provider about which  screenings and vaccines you need and how often you need them. ?This information is not intended to replace advice given to you by your health care provider. Make sure you discuss any questions you have with your health care provider. ?Document Released: 03/01/2015 Document Revised: 10/23/2015 Document Reviewed: 12/04/2014 ?Elsevier Interactive Patient Education ? 2017 [place]. ? ?Fall Prevention in the Home ?Falls can cause injuries. They can happen to people of all ages. There are many things you can do to make your home safe and to help prevent falls. ?What can I do on the outside of my home? ?Regularly fix the edges of walkways and driveways and fix any cracks. ?Remove anything that might make you trip as you walk through a door, such as a raised step or threshold. ?Trim any bushes or trees on the path to your home. ?Use bright outdoor lighting. ?Clear any walking paths of anything that might make someone trip, such as rocks or tools. ?Regularly check to see if handrails are loose or broken. Make sure that both sides of any steps have handrails. ?Any raised decks and porches should have guardrails on the edges. ?Have any leaves, snow, or ice cleared regularly. ?Use sand or salt on walking paths during winter. ?Clean up any spills in your garage right away. This includes oil or grease spills. ?What can I do in the bathroom? ?Use night lights. ?Install grab bars by the toilet and in the tub and shower. Do not use towel bars as grab bars. ?Use non-skid mats or decals in the tub or shower. ?If you need to sit down in the shower, use a plastic, non-slip stool. ?Keep the floor dry. Clean up any water that spills on the floor as soon as it happens. ?Remove soap buildup in the tub or shower regularly. ?Attach bath mats securely with double-sided non-slip rug tape. ?Do not have throw rugs and other things on the floor that can make you trip. ?What can I do in the bedroom? ?Use night lights. ?Make sure that you have a  light by your bed that is easy to reach. ?Do not use any sheets or blankets that are too big for your bed. They should not hang down onto the floor. ?Have a firm chair that has side arms. You can use this for support while you get dressed. ?Do not have throw rugs and other things on the floor that can make you trip. ?What can I do in the kitchen? ?Clean up any spills right away. ?Avoid walking on wet floors. ?Keep items that you use a lot in easy-to-reach places. ?If you need to reach something above you, use a strong step stool that has a grab bar. ?Keep electrical cords out of the way. ?Do not use floor polish or wax that makes floors slippery. If you must use wax, use non-skid floor wax. ?Do not have throw rugs and other things on the floor that can make you trip. ?What can I do with my stairs? ?Do not leave any items on the stairs. ?Make sure that there are handrails on both sides of the stairs and use them. Fix handrails that are broken or loose. Make sure that handrails are as long as the stairways. ?Check any carpeting to make sure that it is firmly attached to the stairs. Fix any carpet that is loose or worn. ?Avoid having throw rugs at the top or bottom of the stairs. If you  do have throw rugs, attach them to the floor with carpet tape. ?Make sure that you have a light switch at the top of the stairs and the bottom of the stairs. If you do not have them, ask someone to add them for you. ?What else can I do to help prevent falls? ?Wear shoes that: ?Do not have high heels. ?Have rubber bottoms. ?Are comfortable and fit you well. ?Are closed at the toe. Do not wear sandals. ?If you use a stepladder: ?Make sure that it is fully opened. Do not climb a closed stepladder. ?Make sure that both sides of the stepladder are locked into place. ?Ask someone to hold it for you, if possible. ?Clearly mark and make sure that you can see: ?Any grab bars or handrails. ?First and last steps. ?Where the edge of each step  is. ?Use tools that help you move around (mobility aids) if they are needed. These include: ?Canes. ?Walkers. ?Scooters. ?Crutches. ?Turn on the lights when you go into a dark area. Replace any light bulbs as soon as

## 2021-05-27 NOTE — Progress Notes (Signed)
Full PFT performed today. °

## 2021-10-14 ENCOUNTER — Other Ambulatory Visit: Payer: Self-pay | Admitting: Medical

## 2021-11-04 ENCOUNTER — Other Ambulatory Visit: Payer: Self-pay | Admitting: Medical

## 2021-11-27 ENCOUNTER — Encounter: Payer: Self-pay | Admitting: Medical

## 2021-11-27 MED ORDER — FLUTICASONE PROPIONATE HFA 110 MCG/ACT IN AERO: 2.0000 | INHALATION_SPRAY | Freq: Two times a day (BID) | RESPIRATORY_TRACT | 3 refills | Status: DC

## 2021-12-05 ENCOUNTER — Ambulatory Visit: Payer: Medicare Other | Admitting: Internal Medicine

## 2021-12-05 ENCOUNTER — Encounter: Payer: Self-pay | Admitting: Internal Medicine

## 2021-12-05 DIAGNOSIS — J45901 Unspecified asthma with (acute) exacerbation: Secondary | ICD-10-CM | POA: Insufficient documentation

## 2021-12-05 DIAGNOSIS — J4521 Mild intermittent asthma with (acute) exacerbation: Secondary | ICD-10-CM

## 2021-12-05 HISTORY — DX: Unspecified asthma with (acute) exacerbation: J45.901

## 2021-12-05 MED ORDER — BUDESONIDE-FORMOTEROL FUMARATE 80-4.5 MCG/ACT IN AERO: INHALATION_SPRAY | RESPIRATORY_TRACT | 12 refills | Status: DC

## 2021-12-05 MED ORDER — PREDNISONE 10 MG PO TABS: ORAL_TABLET | ORAL | 0 refills | Status: DC

## 2021-12-05 NOTE — Assessment & Plan Note (Signed)
Onset age 76, some better on flovent 110  - 12/05/2021  After extensive coaching inhaler device,  effectiveness =    75% > try breztri samples then maint on symbicoxrt 80  2-4 puffs per day   Based on two studies from [place]  378; 20 p 1865 (2018) and 380 : p2020-30 (2019) in pts with mild asthma it is reasonable to use low dose symbicort eg 80 2bid "prn" flare in this setting but I emphasized this was only shown with symbicort and takes advantage of the rapid onset of action but is not the same as "rescue therapy" but can be stopped once the acute symptoms have resolved and the need for rescue has been minimized (< 2 x weekly)    For present rx rec breztri sample and Prednisone 10 mg take  4 each am x 2 days,   2 each am x 2 days,  1 each am x 2 days and stop plus short course of gerd rx just while coughing to prevent cyclical cough/ discussed.          Each maintenance medication was reviewed in detail including emphasizing most importantly the difference between maintenance and prns and under what circumstances the prns are to be triggered using an action plan format where appropriate.  Total time for H and P, chart review, counseling, reviewing hfa  device(s) and generating customized AVS unique to this office visit / same day charting = 32 min for pt new to me with refractory recurrent cough

## 2021-12-05 NOTE — Progress Notes (Signed)
Ruben Henry, male    DOB: 25-Sep-1945   MRN: [id number]   Brief patient profile:  3  yowm never smoker  with onset intermittent cough and sob x age 76  fine between maint on Flovent but 3-4 flares per year despite flovent of refractory cough and wheeze     History of Present Illness  12/05/2021  Pulmonary/ Acute office eval/Ruben Henry maint on flovent  Chief Complaint  Patient presents with   Acute Visit    Pt c/o prod cough with yellow sputum over the past month- worse in the morning when he wakes up. He also c/o PND and wheezing.   Dyspnea:  assoc with cough/ congestion/ wheeze  Cough: slt yellow esp in am assoc with sense of pnds  Sleep: fine sleeping though wheezing at hs  SABA use: none need at baseline but using twice   No obvious day to day or daytime pattern/variability or assoc  mucus plugs or hemoptysis or cp or chest tightness,   or overt  hb symptoms.    . Also denies any obvious fluctuation of symptoms with weather or environmental changes or other aggravating or alleviating factors except as outlined above   No unusual exposure hx or h/o childhood pna/ asthma or knowledge of premature birth.  Current Allergies, Complete Past Medical History, Past Surgical History, Family History, and Social History were reviewed in [employer] record.  ROS  The following are not active complaints unless bolded Hoarseness, sore throat, dysphagia, dental problems, itching, sneezing,  nasal congestion or discharge of excess mucus or purulent secretions, ear ache,   fever, chills, sweats, unintended wt loss or wt gain, classically pleuritic or exertional cp,  orthopnea pnd or arm/hand swelling  or leg swelling, presyncope, palpitations, abdominal pain, anorexia, nausea, vomiting, diarrhea  or change in bowel habits or change in bladder habits, change in stools or change in urine, dysuria, hematuria,  rash, arthralgias, visual complaints, headache, numbness, weakness or  ataxia or problems with walking or coordination,  change in mood or  memory.           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   CAD (coronary artery disease) 07/20/2013   s/p remote PCI of RCA and LCX in 1998   Hypercholesterolemia    Hypertension    Osteoarthritis of right hip    Prostate cancer [facility])    brachytherapy    Outpatient Medications Prior to Visit  Medication Sig Dispense Refill   albuterol (VENTOLIN HFA) 108 (90 Base) MCG/ACT inhaler USE 2 INHALATIONS BY MOUTH EVERY 6 HOURS AS NEEDED FOR WHEEZING  OR SHORTNESS OF BREATH 34 g 3   aspirin 81 MG tablet Take 81 mg by mouth every other day.      ezetimibe (ZETIA) 10 MG tablet TAKE 1 TABLET BY MOUTH DAILY 100 tablet 2   fluticasone (FLONASE) 50 MCG/ACT nasal spray Place 2 sprays into both nostrils daily. (Patient taking differently: Place 2 sprays into both nostrils daily as needed.) 16 g 2   fluticasone (FLOVENT HFA) 110 MCG/ACT inhaler Inhale 2 puffs into the lungs 2 (two) times daily. Ignore prior rx. Pt wants 3 month supply. 3 each 3   losartan (COZAAR) 25 MG tablet TAKE 1 TABLET BY MOUTH DAILY 100 tablet 2   Omega-3 Fatty Acids (OMEGA 3 PO) Take 1 capsule by mouth daily.     senna (SENOKOT) 8.6 MG tablet Take 1 tablet  by mouth daily.     No facility-administered medications prior to visit.     Objective:     BP 126/74 (BP Location: Left Arm, Cuff Size: Normal)   Pulse 72   Temp 97.8 F (36.6 C) (Oral)   Ht 5' 10.5" (1.791 m)   Wt 200 lb (90.7 kg)   SpO2 93% Comment: on RA  BMI 28.29 kg/m   SpO2: 93 % (on RA)  Pleasant wm harsh mostly dry sounding cough   HEENT : Oropharynx  clear     Nasal turbinates nl    NECK :  without  apparent JVD/ palpable Nodes/TM    LUNGS: no acc muscle use,  Nl contour chest  with insp/exp rhonchi bilaterally without cough on insp or exp maneuvers   CV:  RRR  no s3 or murmur or increase in P2, and no edema   ABD:  soft and  nontender with nl inspiratory excursion in the supine position. No bruits or organomegaly appreciated   MS:  Nl gait/ ext warm without deformities Or obvious joint restrictions  calf tenderness, cyanosis or clubbing    SKIN: warm and dry without lesions    NEURO:  alert, approp, nl sensorium with  no motor or cerebellar deficits apparent.          Assessment   Asthmatic bronchitis with acute exacerbation Onset age 22, some better on flovent 110  - 12/05/2021  After extensive coaching inhaler device,  effectiveness =    75% > try breztri samples then maint on symbicoxrt 80  2-4 puffs per day   Based on two studies from [place]  378; 20 p 1865 (2018) and 380 : p2020-30 (2019) in pts with mild asthma it is reasonable to use low dose symbicort eg 80 2bid "prn" flare in this setting but I emphasized this was only shown with symbicort and takes advantage of the rapid onset of action but is not the same as "rescue therapy" but can be stopped once the acute symptoms have resolved and the need for rescue has been minimized (< 2 x weekly)    For present rx rec breztri sample and Prednisone 10 mg take  4 each am x 2 days,   2 each am x 2 days,  1 each am x 2 days and stop plus short course of gerd rx just while coughing to prevent cyclical cough/ discussed.          Each maintenance medication was reviewed in detail including emphasizing most importantly the difference between maintenance and prns and under what circumstances the prns are to be triggered using an action plan format where appropriate.  Total time for H and P, chart review, counseling, reviewing hfa  device(s) and generating customized AVS unique to this office visit / same day charting = 32 min for pt new to me with refractory recurrent cough          Ruben Gully, MD 12/05/2021

## 2021-12-05 NOTE — Patient Instructions (Addendum)
Plan A = Automatic = Always=    Breztri (Symbicort 80) Take 2 puffs first thing in am and then another 2 puffs about 12 hours later until 100% better then 2 puffs each am  Work on inhaler technique:  relax and gently blow all the way out then take a nice smooth full deep breath back in, triggering the inhaler at same time you start breathing in.  Hold breath in for at least  5 seconds if you can. Blow out [employer])  thru nose. Rinse and gargle with water when done.  If mouth or throat bother you at all,  try brushing teeth/gums/tongue with arm and hammer toothpaste/ make a slurry and gargle and spit out.      Plan B = Backup (to supplement plan A, not to replace it) Only use your albuterol inhaler as a rescue medication to be used if you can't catch your breath by resting or doing a relaxed purse lip breathing pattern.  - The less you use it, the better it will work when you need it. - Ok to use the inhaler up to 2 puffs  every 4 hours if you must but call for appointment if use goes up over your usual need - Don't leave home without it !!  (think of it like the spare tire for your car)   Prednisone 10 mg take  4 each am x 2 days,   2 each am x 2 days,  1 each am x 2 days and stop   If still coughing >  Try prilosec otc '20mg'$   Take 30-60 min before first meal of the day and Pepcid ac (famotidine) 20 mg one @  bedtime until cough is completely gone for at least a week without the need for cough suppression    Please schedule a follow up visit in 3 months but call sooner if needed to See Dr Darien Ramus

## 2022-02-17 ENCOUNTER — Encounter: Payer: Self-pay | Admitting: Internal Medicine

## 2022-02-18 MED ORDER — BUDESONIDE-FORMOTEROL FUMARATE 80-4.5 MCG/ACT IN AERO: INHALATION_SPRAY | RESPIRATORY_TRACT | 3 refills | Status: DC

## 2022-03-19 ENCOUNTER — Emergency Department (HOSPITAL_BASED_OUTPATIENT_CLINIC_OR_DEPARTMENT_OTHER)
Admission: EM | Admit: 2022-03-19 | Discharge: 2022-03-19 | Disposition: A | Discharge status: Home / Self Care | Payer: Medicare Other | Attending: Emergency Medicine | Admitting: Emergency Medicine

## 2022-03-19 ENCOUNTER — Emergency Department (HOSPITAL_BASED_OUTPATIENT_CLINIC_OR_DEPARTMENT_OTHER): Payer: Medicare Other

## 2022-03-19 DIAGNOSIS — Z7951 Long term (current) use of inhaled steroids: Secondary | ICD-10-CM | POA: Insufficient documentation

## 2022-03-19 DIAGNOSIS — Z8546 Personal history of malignant neoplasm of prostate: Secondary | ICD-10-CM | POA: Diagnosis not present

## 2022-03-19 DIAGNOSIS — I10 Essential (primary) hypertension: Secondary | ICD-10-CM | POA: Insufficient documentation

## 2022-03-19 DIAGNOSIS — I2510 Atherosclerotic heart disease of native coronary artery without angina pectoris: Secondary | ICD-10-CM | POA: Diagnosis not present

## 2022-03-19 DIAGNOSIS — J45901 Unspecified asthma with (acute) exacerbation: Secondary | ICD-10-CM

## 2022-03-19 DIAGNOSIS — Z79899 Other long term (current) drug therapy: Secondary | ICD-10-CM | POA: Diagnosis not present

## 2022-03-19 DIAGNOSIS — R0602 Shortness of breath: Secondary | ICD-10-CM | POA: Diagnosis not present

## 2022-03-19 DIAGNOSIS — Z7982 Long term (current) use of aspirin: Secondary | ICD-10-CM | POA: Insufficient documentation

## 2022-03-19 DIAGNOSIS — J209 Acute bronchitis, unspecified: Secondary | ICD-10-CM | POA: Diagnosis not present

## 2022-03-19 LAB — CBC WITH DIFFERENTIAL/PLATELET
Abs Immature Granulocytes: 0.01 10*3/uL (ref 0.00–0.07)
Basophils Absolute: 0.1 10*3/uL (ref 0.0–0.1)
Basophils Relative: 1 %
Eosinophils Absolute: 0.4 10*3/uL (ref 0.0–0.5)
Eosinophils Relative: 8 %
HCT: 41.9 % (ref 39.0–52.0)
Hemoglobin: 14.4 g/dL (ref 13.0–17.0)
Immature Granulocytes: 0 %
Lymphocytes Relative: 21 %
Lymphs Abs: 1.1 10*3/uL (ref 0.7–4.0)
MCH: 33.7 pg (ref 26.0–34.0)
MCHC: 34.4 g/dL (ref 30.0–36.0)
MCV: 98.1 fL (ref 80.0–100.0)
Monocytes Absolute: 0.7 10*3/uL (ref 0.1–1.0)
Monocytes Relative: 13 %
Neutro Abs: 3.1 10*3/uL (ref 1.7–7.7)
Neutrophils Relative %: 57 %
Platelets: 182 10*3/uL (ref 150–400)
RBC: 4.27 MIL/uL (ref 4.22–5.81)
RDW: 14.4 % (ref 11.5–15.5)
WBC: 5.5 10*3/uL (ref 4.0–10.5)
nRBC: 0 % (ref 0.0–0.2)

## 2022-03-19 LAB — BASIC METABOLIC PANEL
Anion gap: 9 (ref 5–15)
BUN: 11 mg/dL (ref 8–23)
CO2: 24 mmol/L (ref 22–32)
Calcium: 8.6 mg/dL — ABNORMAL LOW (ref 8.9–10.3)
Chloride: 100 mmol/L (ref 98–111)
Creatinine, Ser: 0.75 mg/dL (ref 0.61–1.24)
GFR, Estimated: >60 mL/min (ref >60)
Glucose, Bld: 126 mg/dL — ABNORMAL HIGH (ref 70–99)
Potassium: 3.8 mmol/L (ref 3.5–5.1)
Sodium: 133 mmol/L — ABNORMAL LOW (ref 135–145)

## 2022-03-19 LAB — BRAIN NATRIURETIC PEPTIDE: B Natriuretic Peptide: 34.7 pg/mL (ref 0.0–100.0)

## 2022-03-19 MED ORDER — IPRATROPIUM-ALBUTEROL 0.5-2.5 (3) MG/3ML IN SOLN
3.0000 mL | Freq: Once | RESPIRATORY_TRACT | Status: AC
  Administered 2022-03-19: 3 mL via RESPIRATORY_TRACT

## 2022-03-19 MED ORDER — IPRATROPIUM-ALBUTEROL 0.5-2.5 (3) MG/3ML IN SOLN
3.0000 mL | Freq: Once | RESPIRATORY_TRACT | Status: AC
  Filled 2022-03-19: qty 6

## 2022-03-19 MED ORDER — METHYLPREDNISOLONE SODIUM SUCC 125 MG IJ SOLR
125.0000 mg | Freq: Once | INTRAMUSCULAR | Status: AC
  Administered 2022-03-19: 125 mg via INTRAVENOUS
  Filled 2022-03-19: qty 2

## 2022-03-19 MED ORDER — IPRATROPIUM-ALBUTEROL 0.5-2.5 (3) MG/3ML IN SOLN
RESPIRATORY_TRACT | Status: AC
  Administered 2022-03-19: 3 mL via RESPIRATORY_TRACT
  Filled 2022-03-19: qty 3

## 2022-03-19 MED ORDER — MAGNESIUM SULFATE 2 GM/50ML IV SOLN
2.0000 g | Freq: Once | INTRAVENOUS | Status: AC
  Administered 2022-03-19: 2 g via INTRAVENOUS
  Filled 2022-03-19: qty 50

## 2022-03-19 MED ORDER — IPRATROPIUM-ALBUTEROL 0.5-2.5 (3) MG/3ML IN SOLN
3.0000 mL | Freq: Once | RESPIRATORY_TRACT | Status: AC
  Administered 2022-03-19: 3 mL via RESPIRATORY_TRACT
  Filled 2022-03-19: qty 3

## 2022-03-19 MED ORDER — ALBUTEROL SULFATE (2.5 MG/3ML) 0.083% IN NEBU
INHALATION_SOLUTION | RESPIRATORY_TRACT | Status: AC
  Filled 2022-03-19: qty 3

## 2022-03-19 MED ORDER — PREDNISONE 20 MG PO TABS: 40.0000 mg | ORAL_TABLET | Freq: Every day | ORAL | 0 refills | Status: AC

## 2022-03-19 MED ORDER — ALBUTEROL SULFATE (2.5 MG/3ML) 0.083% IN NEBU: 2.5000 mg | INHALATION_SOLUTION | Freq: Once | RESPIRATORY_TRACT | Status: DC

## 2022-03-19 NOTE — ED Notes (Signed)
Discharge paperwork reviewed entirely with patient, including Rx's and follow up care. Pain was under control. Pt verbalized understanding as well as all parties involved. No questions or concerns voiced at the time of discharge. No acute distress noted.   Pt ambulated out to PVA without incident or assistance.

## 2022-03-19 NOTE — Discharge Instructions (Addendum)
Thank you for coming to [facility] Emergency Department. You were seen for shortness of breath. We did an exam, labs, and imaging, and these showed likely an exacerbation.  You were treated in the emergency department with nebulizers, magnesium, and steroid.  We will prescribe prednisone to take 40 mg every day for 5 days.  Please use your Symbicort inhaler as prescribed.  Instead of using albuterol only for rescue, please utilize the albuterol inhaler every 4 hours scheduled for the next several days.  Please make an appointment to follow-up with your pulmonologist within the next 2 weeks.  Do not hesitate to return to the ED or call 911 if you experience: -Worsening symptoms -Chest pain, shortness of breath -Lightheadedness, passing out -Fevers/chills -Anything else that concerns you

## 2022-03-19 NOTE — ED Notes (Addendum)
.  Pt returned from the bathroom and had desat'd down to 91% but recovered quickly back to 94-95%.

## 2022-03-19 NOTE — ED Provider Notes (Signed)
[facility] Provider Note   CSN: [id number] Arrival date & time: 03/19/22  0806     History  Chief Complaint  Patient presents with   Shortness of Breath    Ruben Henry is a 77 y.o. male with asthmatic bronchitis, HTN, HLD, CAD, history of prostate cancer presents with SOB.   Pt reports SOB and wheezing that started at approx 12 midnight last night. Pt reports congestion, coughing, and mucus production several weeks. Pt had flu like symptoms in December, but cough continued since then.  He has not had any shortness of breath however until last night.  He has follow-up with pulmonology and received Symbicort and rescue albuterol inhaler.  He has utilizes Symbicort daily as instructed and thinks it is helping.  Tried the albuterol rescue inhaler last night after he became short of breath but did not notice that it helped.  He is wheezing today and has also noticed the wheezing over the last several weeks.  He has no chest pain, leg swelling, nausea vomiting diarrhea, fever/chills, hemoptysis.  He has no history of smoking.  Per chart review patient saw pulmonology on 12/05/2021.  Noted productive cough with yellow sputum x 1 month at that time, dyspnea, congestion, wheezing.  Was diagnosed at that time with asthmatic bronchitis with acute exacerbation and prescribed Symbicort and a taper of prednisone.   Shortness of Breath      Home Medications Prior to Admission medications   Medication Sig Start Date End Date Taking? Authorizing Provider  predniSONE (DELTASONE) 20 MG tablet Take 2 tablets (40 mg total) by mouth daily for 5 days. 03/19/22 03/24/22 Yes Audley Hose, MD  albuterol (VENTOLIN HFA) 108 (90 Base) MCG/ACT inhaler USE 2 INHALATIONS BY MOUTH EVERY 6 HOURS AS NEEDED FOR WHEEZING  OR SHORTNESS OF BREATH 05/05/21   Saguier, [name], PA-C  aspirin 81 MG tablet Take 81 mg by mouth every other day.     [provider]   budesonide-formoterol (SYMBICORT) 80-4.5 MCG/ACT inhaler Take 2 puffs first thing in am and then another 2 puffs about 12 hours later. 02/18/22   Tanda Rockers, MD  ezetimibe (ZETIA) 10 MG tablet TAKE 1 TABLET BY MOUTH DAILY 11/04/21   Saguier, [name], PA-C  fluticasone [facility]) 50 MCG/ACT nasal spray Place 2 sprays into both nostrils daily. Patient taking differently: Place 2 sprays into both nostrils daily as needed. 12/20/20   Saguier, [name], PA-C  losartan (COZAAR) 25 MG tablet TAKE 1 TABLET BY MOUTH DAILY 11/04/21   Saguier, [name], PA-C  Omega-3 Fatty Acids (OMEGA 3 PO) Take 1 capsule by mouth daily.    [provider]  senna (SENOKOT) 8.6 MG tablet Take 1 tablet by mouth daily.    [provider]      Allergies    Patient has no known allergies.    Review of Systems   Review of Systems  Respiratory:  Positive for shortness of breath.    Review of systems Negative for f/c.  A 10 point review of systems was performed and is negative unless otherwise reported in HPI.  Physical Exam Updated Vital Signs BP (!) 147/83   Pulse 85   Temp 97.7 F (36.5 C) (Oral)   Resp 20   Ht 5' 10.5" (1.791 m)   Wt 87.5 kg   SpO2 94%   BMI 27.30 kg/m  Physical Exam General: Normal appearing male, lying in bed.  HEENT: PERRLA, Sclera anicteric, MMM, trachea  midline.  Cardiology: RRR, no murmurs/rubs/gallops. BL radial and DP pulses equal bilaterally.  Resp: Mildlly increased WOB and mild tachypnea, mild resp distress. Diffuse inspiratory/expiratory wheezing. Talking in 3-4 word sentences. Abd: Soft, non-tender, non-distended. No rebound tenderness or guarding.  GU: Deferred. MSK: No peripheral edema or signs of trauma. Extremities without deformity or TTP. No cyanosis or clubbing. Skin: warm, dry. No rashes or lesions. Neuro: A&Ox4, CNs II-XII grossly intact. MAEs. Sensation grossly intact.  Psych: Normal mood and affect.   ED Results / Procedures / Treatments   Labs (all  labs ordered are listed, but only abnormal results are displayed) Labs Reviewed  BASIC METABOLIC PANEL - Abnormal; Notable for the following components:      Result Value   Sodium 133 (*)    Glucose, Bld 126 (*)    Calcium 8.6 (*)    All other components within normal limits  CBC WITH DIFFERENTIAL/PLATELET  BRAIN NATRIURETIC PEPTIDE    EKG EKG Interpretation  Date/Time:  Thursday March 19 2022 08:13:41 EST Ventricular Rate:  98 PR Interval:  175 QRS Duration: 96 QT Interval:  354 QTC Calculation: 452 R Axis:   77 Text Interpretation: Sinus rhythm Minimal ST depression, inferior leads Confirmed by Cindee Lame [phone]) on 03/19/2022 8:23:51 AM  Radiology DG Chest 2 View  Result Date: 03/19/2022 CLINICAL DATA:  Shortness of breath. EXAM: CHEST - 2 VIEW COMPARISON:  March 21, 2021. FINDINGS: The heart size and mediastinal contours are within normal limits. Both lungs are clear. The visualized skeletal structures are unremarkable. IMPRESSION: No active cardiopulmonary disease. Electronically Signed   By: Marijo Conception M.D.   On: 03/19/2022 08:56    Procedures Procedures    Medications Ordered in ED Medications  ipratropium-albuterol (DUONEB) 0.5-2.5 (3) MG/3ML nebulizer solution 3 mL (3 mLs Nebulization Given 03/19/22 0825)  ipratropium-albuterol (DUONEB) 0.5-2.5 (3) MG/3ML nebulizer solution 3 mL (3 mLs Nebulization Given 03/19/22 0854)  ipratropium-albuterol (DUONEB) 0.5-2.5 (3) MG/3ML nebulizer solution 3 mL (3 mLs Nebulization Given 03/19/22 0855)  methylPREDNISolone sodium succinate (SOLU-MEDROL) 125 mg/2 mL injection 125 mg (125 mg Intravenous Given 03/19/22 1000)  magnesium sulfate IVPB 2 g 50 mL (0 g Intravenous Stopped 03/19/22 1059)  ipratropium-albuterol (DUONEB) 0.5-2.5 (3) MG/3ML nebulizer solution 3 mL (3 mLs Nebulization Given 03/19/22 1220)    ED Course/ Medical Decision Making/ A&P                          Medical Decision Making Amount and/or Complexity of Data  Reviewed Labs: ordered. Decision-making details documented in ED Course. Radiology: ordered. Decision-making details documented in ED Course.  Risk Prescription drug management.    This patient presents to the ED for concern of SOB, this involves an extensive number of treatment options, and is a complaint that carries with it a high risk of complications and morbidity.  I considered the following differential and admission for this acute, potentially life threatening condition.   MDM:    DDX for dyspnea includes but is not limited to:  Patient with diffuse wheezing diagnosed recently by pulmonology with asthmatic bronchitis now presents with mild respiratory distress. Considered asthmatic bronchitis exacerbation as previously diagnosed by his pulmonologist.  Patient has diffuse inspiratory wheezing as well as cough which seems most consistent presentation.  Also consider the presentation such as heart failure though he does not have any significant volume overload or crackles on exam but will obtain BNP.  He has no chest pain to  indicate MI, and EKG does not demonstrate any ischemic changes or arrhythmia.  No murmurs indicate valvular heart disease.  Consider pneumonia given cough and shortness of breath or pulmonary effusion.   Will treat patient as if he is having an asthma exacerbation and treat with 3 DuoNeb stacked as well as 125 mg Solu-Medrol and magnesium IV.  Clinical Course as of 03/19/22 1222  Thu Mar 19, 2022  1035 Patient reevaluated after 3 duonebs, solumedrol, and some Mg.  He states he feels like he is breathing normally now and on auscultation has significantly less wheezing.  Still with very minor inspiratory wheezing and mild expiratory wheezing.  He states he is no longer feel short of breath.  Magnesium still infusing.  Will ambulate patient with nurse. [HN]  3244 Basic metabolic panel(!) Mild hypoNa, otherwise unremarkable [HN]  1036 CBC with Differential wnl [HN]   1036 DG Chest 2 View FINDINGS: The heart size and mediastinal contours are within normal limits. Both lungs are clear. The visualized skeletal structures are unremarkable.  IMPRESSION: No active cardiopulmonary disease.   [HN]  1036 SpO2: 94 % [HN]  1036 O2 Device: Room Air [HN]  1042 B Natriuretic Peptide: 34.7 Neg [HN]  1109 Ambulated without respiratory distress or difficulty. Lowest O2 sat was 91%.  [HN]  1217 Patient reevaluated.  He is having a little bit more cough than he was on my prior evaluation but he states he is still breathing okay.  Discussed with patient we will give him additional DuoNeb and discharged with prednisone burst.  Discussed with patient who states that he lives close and if he has any other additional shortness of breath he can return.  I advised patient to continue his Symbicort as prescribed but instead of just using the albuterol for rescue I advised him to use it every 4 hours scheduled for the next several days.  As well as the prednisone.  Patient has no fever or leukocytosis, do not believe treatment with antibiotics is necessary at this time.  Patient is given extensive discharge instructions and return precautions, all questions answered to patient satisfaction.  Advised to follow-up with his pulmonologist within the next 2 weeks. [HN]    Clinical Course User Index [HN] Audley Hose, MD    Labs: I Ordered, and personally interpreted labs.  The pertinent results include: Those listed above  Imaging Studies ordered: I ordered imaging studies including chest x-ray I independently visualized and interpreted imaging. I agree with the radiologist interpretation  Additional history obtained from chart review.  External records from outside source obtained and reviewed including pulmonology  Cardiac Monitoring: The patient was maintained on a cardiac monitor.  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   CAD (coronary artery disease) 07/20/2013   s/p remote PCI of RCA and LCX in 1998   Hypercholesterolemia    Hypertension    Osteoarthritis of right hip    Prostate cancer ([place])    brachytherapy     Medicines Meds ordered this encounter  Medications   ipratropium-albuterol (DUONEB) 0.5-2.5 (  3) MG/3ML nebulizer solution 3 mL   DISCONTD: albuterol (PROVENTIL) (2.5 MG/3ML) 0.083% nebulizer solution 2.5 mg   ipratropium-albuterol (DUONEB) 0.5-2.5 (3) MG/3ML nebulizer solution    Cothren, Steve: cabinet override   DISCONTD: albuterol (PROVENTIL) (2.5 MG/3ML) 0.083% nebulizer solution    Cothren, Steve: cabinet override   ipratropium-albuterol (DUONEB) 0.5-2.5 (3) MG/3ML nebulizer solution 3 mL   ipratropium-albuterol (DUONEB) 0.5-2.5 (3) MG/3ML nebulizer solution 3 mL   methylPREDNISolone sodium succinate (SOLU-MEDROL) 125 mg/2 mL injection 125 mg    IV methylprednisolone will be converted to either a q12h or q24h frequency with the same total daily dose (TDD).  Ordered Dose: 1 to 125 mg TDD; convert to: TDD q24h.  Ordered Dose: 126 to 250 mg TDD; convert to: TDD div q12h.  Ordered Dose: >250 mg TDD; DAW.   magnesium sulfate IVPB 2 g 50 mL   ipratropium-albuterol (DUONEB) 0.5-2.5 (3) MG/3ML nebulizer solution 3 mL   predniSONE (DELTASONE) 20 MG tablet    Sig: Take 2 tablets (40 mg total) by mouth daily for 5 days.    Dispense:  10 tablet    Refill:  0    I have reviewed the patients home medicines and have made adjustments as needed  Problem List / ED  Course: Problem List Items Addressed This Visit       Respiratory   Asthmatic bronchitis with acute exacerbation - Primary   Relevant Medications   predniSONE (DELTASONE) 20 MG tablet                This note was created using dictation software, which may contain spelling or grammatical errors.    Audley Hose, MD 03/19/22 [phone]

## 2022-03-19 NOTE — ED Notes (Signed)
RT at bedside administering neb

## 2022-03-19 NOTE — ED Triage Notes (Signed)
Pt reports SOB since approx 12 midnight. Pt reports coughing several weeks. Pt had flu like symptoms in December, but cough continued

## 2022-03-19 NOTE — ED Notes (Signed)
Ambulated on r/a SpO2 93% or greater, HR max 108, RR 22. BBS remain with exp wheezes after duoneb.

## 2022-03-20 ENCOUNTER — Telehealth: Payer: Self-pay

## 2022-03-20 NOTE — Telephone Encounter (Signed)
Transition Care Management Unsuccessful Follow-up Telephone Call  Date of discharge and from where:  [facility] ER, 03/19/2022  Attempts:  1st Attempt  Reason for unsuccessful TCM follow-up call:  No answer/busy

## 2022-03-23 NOTE — Telephone Encounter (Signed)
Transition Care Management Unsuccessful Follow-up Telephone Call  Date of discharge and from where:  [facility] ER 03/19/2022  Attempts:  2nd Attempt  Reason for unsuccessful TCM follow-up call:  No answer/busy

## 2022-03-24 NOTE — Telephone Encounter (Signed)
Transition Care Management Unsuccessful Follow-up Telephone Call  Date of discharge and from where:  [facility] ER 03/19/2022  Attempts:  3rd Attempt  Reason for unsuccessful TCM follow-up call:  No answer/busy

## 2022-03-29 NOTE — Progress Notes (Unsigned)
Synopsis: Referred for chronic cough by Mackie Pai, PA-C  Subjective:   PATIENT ID: Ruben Henry GENDER: male DOB: [birth year], MRN: [id number]  No chief complaint on file.  22yM with history of ACD s/p PCI 1998 and low risk myoview 2018, HTN, history of seed implants for prostate cancer maybe 10 ya, never smoker referred for cough, DOE. Has not had covid-19 infection.   He says he has had coughing at night for years. Usually wakes him up. Feels chest and sinonasal congestion. Sometimes has some associated sneezing. Uses flonase periodically - a few days at a time or maybe everyday over the spring or summer. Is taking xyzal. Not taking montelukast - he is unsure if it was helpful in the past. Does not have any overtly symptomatic reflux. Has never had empiric trial of ppi.   Has improved over last couple of weeks. Has been on flovent 2 puffs twice daily for years. He does think this has been very helpful for cough.   Minimal DOE  He has no family history of lung disease  He worked as an Product manager. He grew up in [place], lived in [place] for a couple years, moved to [place] in 1978. No recent travel. Never smoker. He has no pets at home.   Interval HPI:  PFTs today with mild obstruction, air trapping, borderline bronchodilator response by FVC. He was off of flovent for a week before PFTs.   Some increased runny nose.   No DOE. -------------- At ov with Melvyn Novas 12/05/21 given prednisone taper and changed to symbicort in setting asthma exacerbation.  To UC at [facility] for asthma exacerbation 2/1 given prednisone taper  Otherwise pertinent review of systems is negative.  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   CAD (coronary artery disease) 07/20/2013   s/p remote PCI of RCA and LCX in 1998   Hypercholesterolemia    Hypertension    Osteoarthritis  of right hip    Prostate cancer ([place])    brachytherapy     Family History  Problem Relation Age of Onset   Heart failure Mother    Esophageal cancer Father        died age 44, exp to asbestos   Cancer Father    Diabetes Sister    Allergies Sister    Heart attack Maternal Uncle 73   Coronary artery disease Maternal Uncle 61       with CABG     Past Surgical History:  Procedure Laterality Date   CORONARY ANGIOPLASTY WITH STENT PLACEMENT  06/21/1996   prior angioplasty of the RCA with stenting of the LCX in [place]   Right eye /    TONSILLECTOMY AND ADENOIDECTOMY      Social History   Socioeconomic History   Marital status: Married    Spouse name: Not on file   Number of children: 2   Years of education: Not on file   Highest education level: Not on file  Occupational History   Occupation: Lobbyist: [employer]  Tobacco Use   Smoking status: Never   Smokeless tobacco: Never  Vaping Use   Vaping Use: Never used  Substance and Sexual Activity   Alcohol use: Yes    Alcohol/week: 14.0 standard drinks of alcohol    Types: 14 Cans of  beer per week    Comment: couple of beers a day. Coors Light.   Drug use: No   Sexual activity: Yes  Other Topics Concern   Not on file  Social History Narrative   Not on file   Social Determinants of Health   Financial Resource Strain: Low Risk  (05/27/2021)   Overall Financial Resource Strain (CARDIA)    Difficulty of Paying Living Expenses: Not hard at all  Food Insecurity: No Food Insecurity (05/27/2021)   Hunger Vital Sign    Worried About Running Out of Food in the Last Year: Never true    Ran Out of Food in the Last Year: Never true  Transportation Needs: No Transportation Needs (05/27/2021)   PRAPARE - Hydrologist (Medical): No    Lack of Transportation (Non-Medical): No  Physical Activity: Sufficiently Active (05/27/2021)   Exercise Vital Sign    Days of  Exercise per Week: 7 days    Minutes of Exercise per Session: 60 min  Stress: No Stress Concern Present (05/27/2021)   [place]    Feeling of Stress : Not at all  Social Connections: Moderately Integrated (05/27/2021)   Social Connection and Isolation Panel [NHANES]    Frequency of Communication with Friends and Family: More than three times a week    Frequency of Social Gatherings with Friends and Family: Three times a week    Attends Religious Services: Never    Active Member of Clubs or Organizations: Yes    Attends Archivist Meetings: More than 4 times per year    Marital Status: Married  Human resources officer Violence: Not At Risk (05/27/2021)   Humiliation, Afraid, Rape, and Kick questionnaire    Fear of Current or Ex-Partner: No    Emotionally Abused: No    Physically Abused: No    Sexually Abused: No     No Known Allergies   Outpatient Medications Prior to Visit  Medication Sig Dispense Refill   albuterol (VENTOLIN HFA) 108 (90 Base) MCG/ACT inhaler USE 2 INHALATIONS BY MOUTH EVERY 6 HOURS AS NEEDED FOR WHEEZING  OR SHORTNESS OF BREATH 34 g 3   aspirin 81 MG tablet Take 81 mg by mouth every other day.      budesonide-formoterol (SYMBICORT) 80-4.5 MCG/ACT inhaler Take 2 puffs first thing in am and then another 2 puffs about 12 hours later. 30.6 g 3   ezetimibe (ZETIA) 10 MG tablet TAKE 1 TABLET BY MOUTH DAILY 100 tablet 2   fluticasone (FLONASE) 50 MCG/ACT nasal spray Place 2 sprays into both nostrils daily. (Patient taking differently: Place 2 sprays into both nostrils daily as needed.) 16 g 2   losartan (COZAAR) 25 MG tablet TAKE 1 TABLET BY MOUTH DAILY 100 tablet 2   Omega-3 Fatty Acids (OMEGA 3 PO) Take 1 capsule by mouth daily.     senna (SENOKOT) 8.6 MG tablet Take 1 tablet by mouth daily.     No facility-administered medications prior to visit.       Objective:   Physical Exam:  General  appearance: 77 y.o., male, NAD, conversant  Eyes: anicteric sclerae; PERRL, tracking appropriately HENT: NCAT; MMM Neck: Trachea midline; no lymphadenopathy, no JVD Lungs: CTAB, + expiratory wheeze bl, with normal respiratory effort CV: RRR, no murmur  Abdomen: Soft, non-tender; non-distended, BS present  Extremities: No peripheral edema, warm Skin: Normal turgor and texture; no rash Psych: Appropriate affect Neuro: Alert and oriented  to person and place, no focal deficit     There were no vitals filed for this visit.     on RA BMI Readings from Last 3 Encounters:  03/19/22 27.30 kg/m  12/05/21 28.29 kg/m  05/27/21 29.13 kg/m   Wt Readings from Last 3 Encounters:  03/19/22 193 lb (87.5 kg)  12/05/21 200 lb (90.7 kg)  05/27/21 203 lb (92.1 kg)     CBC    Component Value Date/Time   WBC 5.5 03/19/2022 0948   RBC 4.27 03/19/2022 0948   HGB 14.4 03/19/2022 0948   HCT 41.9 03/19/2022 0948   PLT 182 03/19/2022 0948   MCV 98.1 03/19/2022 0948   MCH 33.7 03/19/2022 0948   MCHC 34.4 03/19/2022 0948   RDW 14.4 03/19/2022 0948   LYMPHSABS 1.1 03/19/2022 0948   MONOABS 0.7 03/19/2022 0948   EOSABS 0.4 03/19/2022 0948   BASOSABS 0.1 03/19/2022 0948    Eos 400 03/19/22  Chest Imaging: CXR 2019 reviewed by me with 2 metallic foreign bodies on right which are likely migrated brachytherapy beads  CXR 03/23/20 reviewed by me stable with exception of questionable RLL opacity vs rib shadow  CXR 03/19/22 unremarkable  Pulmonary Functions Testing Results:    Latest Ref Rng & Units 05/27/2021    1:55 PM  PFT Results  FVC-Pre L 3.22   FVC-Predicted Pre % 75   FVC-Post L 3.52   FVC-Predicted Post % 82   Pre FEV1/FVC % % 64   Post FEV1/FCV % % 64   FEV1-Pre L 2.06   FEV1-Predicted Pre % 66   FEV1-Post L 2.25   DLCO uncorrected ml/min/mmHg 28.64   DLCO UNC% % 113   DLCO corrected ml/min/mmHg 28.64   DLCO COR %Predicted % 113   DLVA Predicted % 127   TLC L 7.36   TLC %  Predicted % 104   RV % Predicted % 145         Assessment & Plan:   # Chronic cough # 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

## 2022-03-31 ENCOUNTER — Encounter: Payer: Self-pay | Admitting: Student

## 2022-03-31 ENCOUNTER — Ambulatory Visit: Payer: Medicare Other | Admitting: Student

## 2022-03-31 VITALS — BP 140/76 | HR 93 | Temp 98.3°F | Ht 70.25 in | Wt 198.4 lb

## 2022-03-31 DIAGNOSIS — R053 Chronic cough: Secondary | ICD-10-CM | POA: Diagnosis not present

## 2022-03-31 DIAGNOSIS — J4540 Moderate persistent asthma, uncomplicated: Secondary | ICD-10-CM | POA: Diagnosis not present

## 2022-03-31 NOTE — Patient Instructions (Addendum)
-   if postnasal drainage increases then resume flonase 1 spray each nostril after clearing your nose out of crusting following a shower - ok to continue non-sedating antihistamine like zyrtec or xyzal, allegra during pollen season - continue symbicort 2 puffs twice daily, rinse mouth and brush tongue/teeth after each use

## 2022-04-19 IMAGING — DX DG CHEST 2V
2 series · 2 of 2 positions shown · non-contrast
Comparison: December 14, 2017.

CLINICAL DATA: Chronic cough.

EXAM:
CHEST - 2 VIEW

[chest pa]
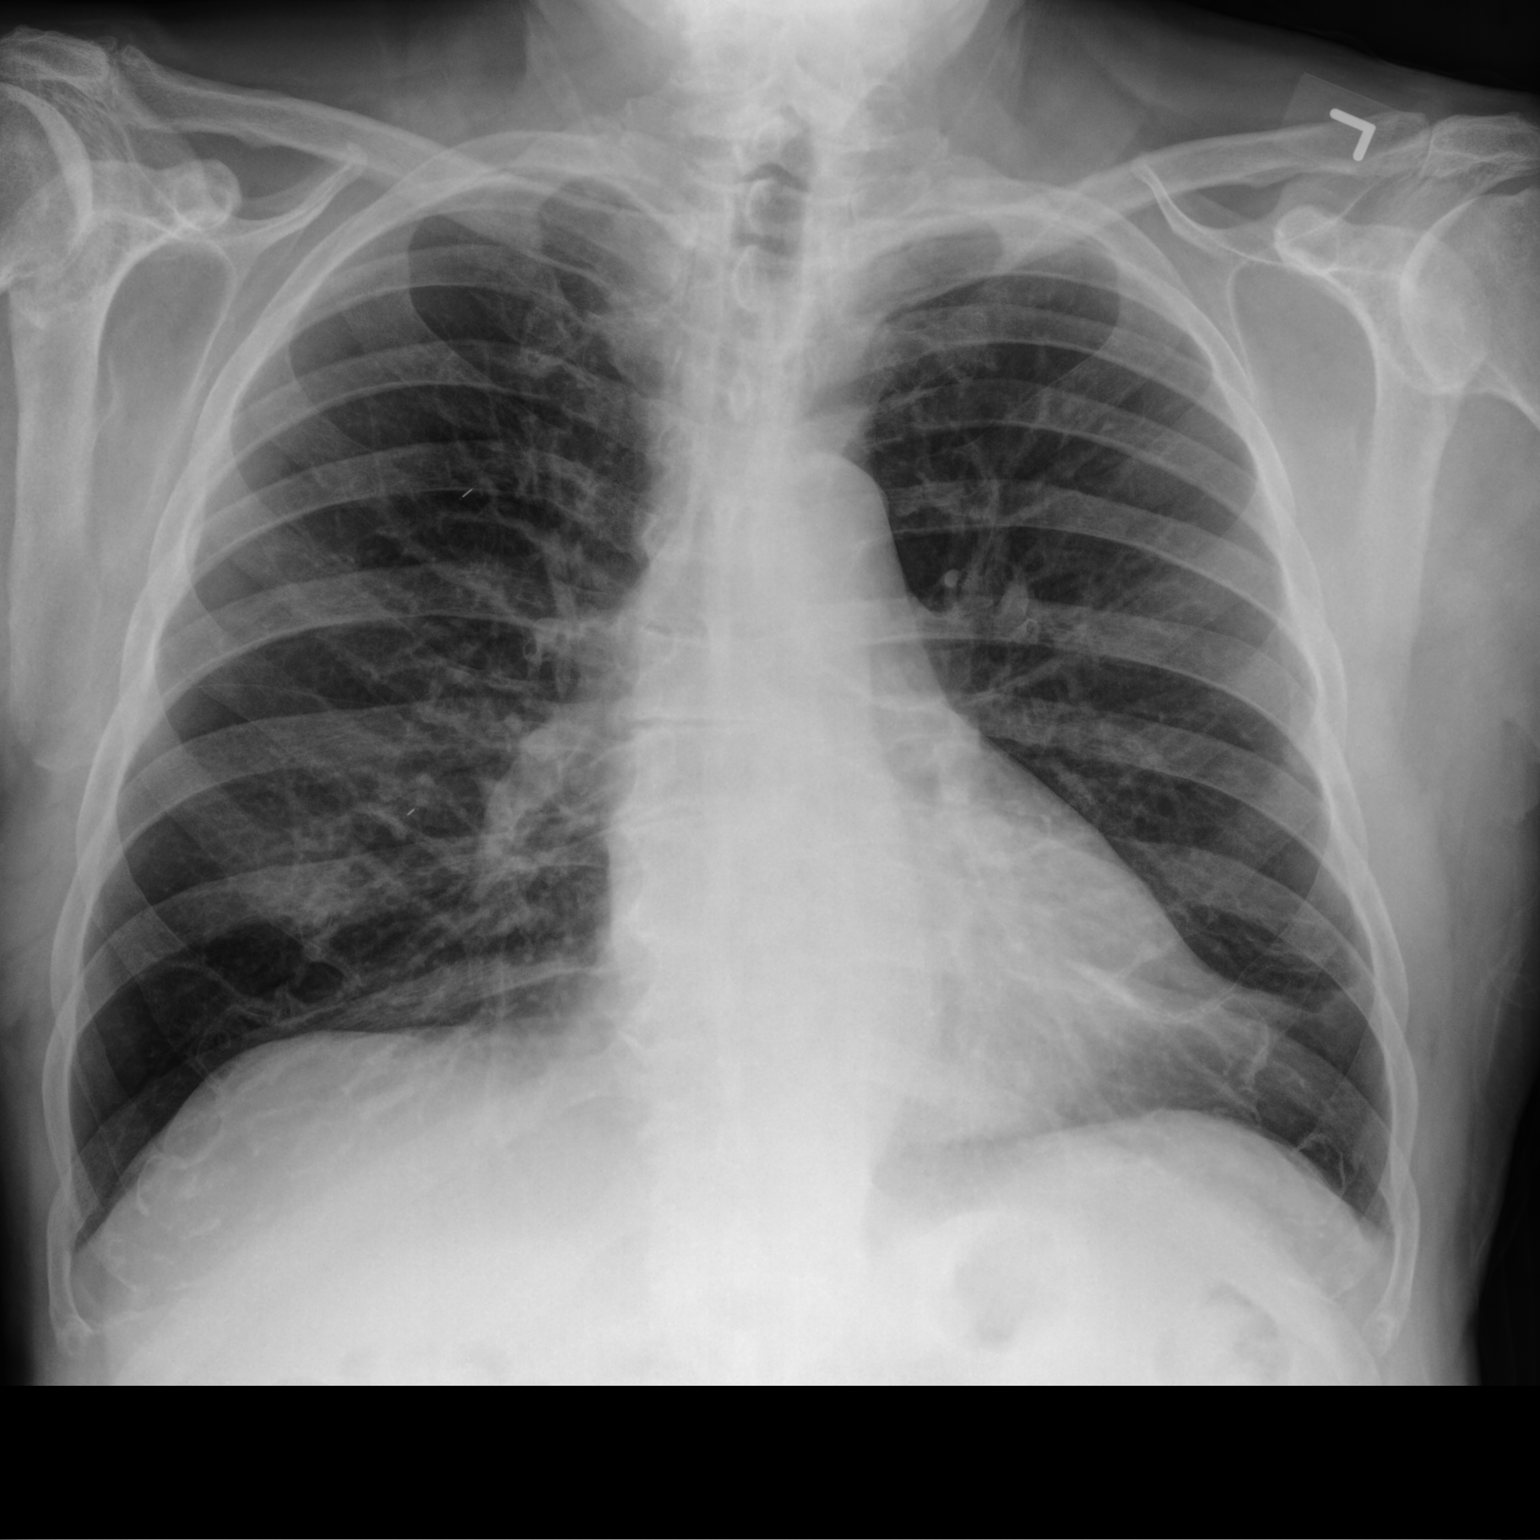

[chest lat]
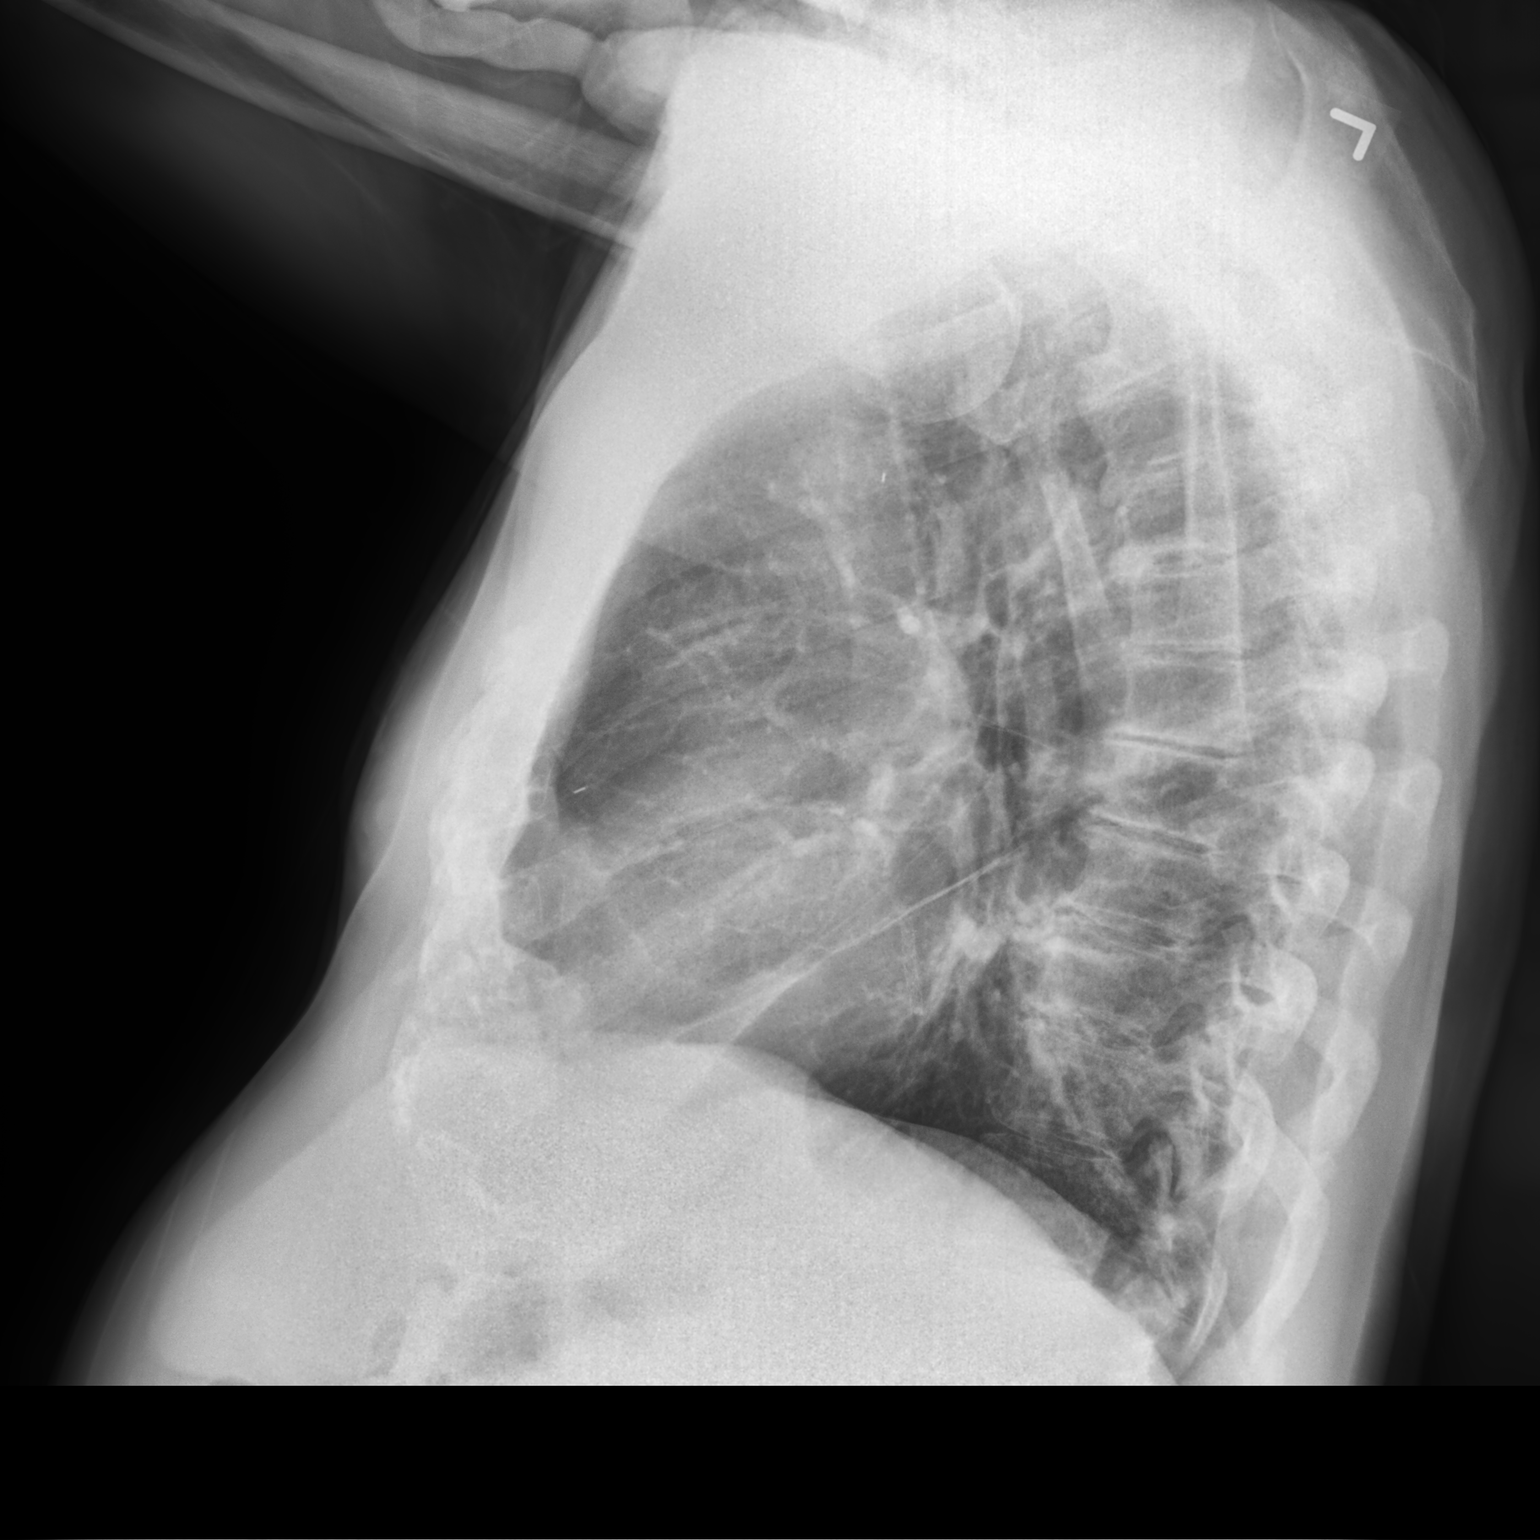

[2 of 2 positions shown; findings below may reference images not displayed]

FINDINGS: The heart size and mediastinal contours are within normal limits.
Right lung is clear. Stable left basilar scarring is noted. The
visualized skeletal structures are unremarkable.
IMPRESSION: No active cardiopulmonary disease.

## 2022-05-09 ENCOUNTER — Other Ambulatory Visit: Payer: Self-pay | Admitting: Medical

## 2022-05-12 DIAGNOSIS — J208 Acute bronchitis due to other specified organisms: Secondary | ICD-10-CM | POA: Diagnosis not present

## 2022-05-12 DIAGNOSIS — J209 Acute bronchitis, unspecified: Secondary | ICD-10-CM | POA: Diagnosis not present

## 2022-05-12 DIAGNOSIS — J45901 Unspecified asthma with (acute) exacerbation: Secondary | ICD-10-CM | POA: Diagnosis not present

## 2022-05-12 DIAGNOSIS — R0902 Hypoxemia: Secondary | ICD-10-CM | POA: Diagnosis not present

## 2022-05-12 DIAGNOSIS — R6889 Other general symptoms and signs: Secondary | ICD-10-CM | POA: Diagnosis not present

## 2022-05-12 DIAGNOSIS — J22 Unspecified acute lower respiratory infection: Secondary | ICD-10-CM | POA: Diagnosis not present

## 2022-05-19 DIAGNOSIS — D649 Anemia, unspecified: Secondary | ICD-10-CM | POA: Diagnosis not present

## 2022-05-28 ENCOUNTER — Ambulatory Visit (INDEPENDENT_AMBULATORY_CARE_PROVIDER_SITE_OTHER): Payer: Medicare Other | Admitting: *Deleted

## 2022-05-28 VITALS — Ht 70.25 in | Wt 191.0 lb

## 2022-05-28 DIAGNOSIS — Z0000 Encounter for general adult medical examination without abnormal findings: Secondary | ICD-10-CM

## 2022-05-28 NOTE — Progress Notes (Signed)
Subjective:  Pt completed ADLs, Fall risk, and SDOH during e-check in on 05/27/22.  Answers verified with pt.    Ruben Henry is a 77 y.o. male who presents for Medicare Annual/Subsequent preventive examination.  I connected with  Edwena Felty on 05/28/22 by a audio enabled telemedicine application and verified that I am speaking with the correct person using two identifiers.  Patient Location: Home  Provider Location: Office/Clinic  I discussed the limitations of evaluation and management by telemedicine. The patient expressed understanding and agreed to proceed.   Review of Systems     Cardiac Risk Factors include: advanced age (>54men, >63 women);male gender;dyslipidemia;hypertension     Objective:    Today's Vitals   05/28/22 0825  Weight: 191 lb (86.6 kg)  Height: 5' 10.25" (1.784 m)   Body mass index is 27.21 kg/m.     05/28/2022    8:26 AM 03/19/2022    8:25 AM 05/27/2021    8:28 AM 05/16/2020    8:29 AM 12/05/2018    9:41 AM 06/11/2017    2:07 PM 11/01/2015   10:37 AM  Advanced Directives  Does Patient Have a Medical Advance Directive? No No Yes No No No Yes  Type of Advance Directive   Living will    Living will  Copy of Healthcare Power of Attorney in Chart?       No - copy requested  Would patient like information on creating a medical advance directive? No - Patient declined  No - Patient declined Yes (MAU/Ambulatory/Procedural Areas - Information given) No - Patient declined No - Patient declined     Current Medications (verified) Outpatient Encounter Medications as of 05/28/2022  Medication Sig   albuterol (VENTOLIN HFA) 108 (90 Base) MCG/ACT inhaler USE 2 INHALATIONS BY MOUTH EVERY 6 HOURS AS NEEDED FOR WHEEZING  OR SHORTNESS OF BREATH   aspirin 81 MG tablet Take 81 mg by mouth every other day.    budesonide-formoterol (SYMBICORT) 80-4.5 MCG/ACT inhaler Take 2 puffs first thing in am and then another 2 puffs about 12 hours later.   ezetimibe  (ZETIA) 10 MG tablet TAKE 1 TABLET BY MOUTH DAILY   fluticasone (FLONASE) 50 MCG/ACT nasal spray Place 2 sprays into both nostrils daily. (Patient taking differently: Place 2 sprays into both nostrils daily as needed.)   losartan (COZAAR) 25 MG tablet TAKE 1 TABLET BY MOUTH DAILY   Omega-3 Fatty Acids (OMEGA 3 PO) Take 1 capsule by mouth daily.   senna (SENOKOT) 8.6 MG tablet Take 1 tablet by mouth daily.   No facility-administered encounter medications on file as of 05/28/2022.    Allergies (verified) Patient has no known allergies.   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   CAD (coronary artery disease) 07/20/2013   s/p remote PCI of RCA and LCX in 1998   Hypercholesterolemia    Hypertension    Osteoarthritis of right hip    Prostate cancer    brachytherapy   Past Surgical History:  Procedure Laterality Date   CORONARY ANGIOPLASTY WITH STENT PLACEMENT  06/21/1996   prior angioplasty of the RCA with stenting of the LCX in 1998   EYE SURGERY  1969   Right eye /    TONSILLECTOMY AND ADENOIDECTOMY     Family History  Problem Relation Age of Onset   Heart failure Mother    Esophageal  cancer Father        died age 76, exp to asbestos   Cancer Father    Diabetes Sister    Allergies Sister    Heart attack Maternal Uncle [id number]   Coronary artery disease Maternal Uncle [id number]       with CABG   Social History   Socioeconomic History   Marital status: Married    Spouse name: Not on file   Number of children: 2   Years of education: Not on file   Highest education level: Not on file  Occupational History   Occupation: Garment/textile technologistengineer    Employer: [employer]  Tobacco Use   Smoking status: Never   Smokeless tobacco: Never  Vaping Use   Vaping Use: Never used  Substance and Sexual Activity   Alcohol use: Yes    Alcohol/week: 14.0 standard drinks of alcohol     Types: 14 Cans of beer per week    Comment: couple of beers a day. Coors Light.   Drug use: No   Sexual activity: Yes  Other Topics Concern   Not on file  Social History Narrative   Not on file   Social Determinants of Health   Financial Resource Strain: Low Risk  (05/27/2022)   Overall Financial Resource Strain (CARDIA)    Difficulty of Paying Living Expenses: Not hard at all  Food Insecurity: No Food Insecurity (05/27/2022)   Hunger Vital Sign    Worried About Running Out of Food in the Last Year: Never true    Ran Out of Food in the Last Year: Never true  Transportation Needs: No Transportation Needs (05/27/2022)   PRAPARE - Administrator, Civil ServiceTransportation    Lack of Transportation (Medical): No    Lack of Transportation (Non-Medical): No  Physical Activity: Insufficiently Active (05/27/2022)   Exercise Vital Sign    Days of Exercise per Week: 4 days    Minutes of Exercise per Session: 30 min  Stress: No Stress Concern Present (05/27/2022)   [employer] - Occupational Stress Questionnaire    Feeling of Stress : Not at all  Social Connections: Unknown (05/27/2022)   Social Connection and Isolation Panel [NHANES]    Frequency of Communication with Friends and Family: Three times a week    Frequency of Social Gatherings with Friends and Family: Twice a week    Attends Religious Services: Not on Marketing executivefile    Active Member of Clubs or Organizations: Yes    Attends BankerClub or Organization Meetings: 1 to 4 times per year    Marital Status: Married    Tobacco Counseling Counseling given: Not Answered   Clinical Intake:  Pre-visit preparation completed: Yes  Pain : No/denies pain  BMI - recorded: 27.21 Nutritional Status: BMI 25 -29 Overweight Nutritional Risks: None Diabetes: No  How often do you need to have someone help you when you read instructions, pamphlets, or other written materials from your doctor or pharmacy?: 1 - Never   Activities of Daily Living     05/27/2022    4:08 PM  In your present state of health, do you have any difficulty performing the following activities:  Hearing? 0  Vision? 0  Difficulty concentrating or making decisions? 0  Walking or climbing stairs? 0  Dressing or bathing? 0  Doing errands, shopping? 0  Preparing Food and eating ? N  Using the Toilet? N  In the past six months, have you accidently leaked urine? N  Do you have problems with  loss of bowel control? N  Managing your Medications? N  Managing your Finances? N  Housekeeping or managing your Housekeeping? N    Patient Care Team: Saguier, Kateri Mc as PCP - General (Physician Assistant) [place], Peter M, MD as PCP - Cardiology (Cardiology) [place], Peter M, MD as Consulting Physician (Cardiology) Loletha Carrow, MD as Consulting Physician (Ophthalmology) Puschinsky, Adelfa Koh., MD as Consulting Physician (Urology)  Indicate any recent Medical Services you may have received from other than [facility] providers in the past year (date may be approximate).     Assessment:   This is a routine wellness examination for Marguis.  Hearing/Vision screen No results found.  Dietary issues and exercise activities discussed: Current Exercise Habits: Home exercise routine, Type of exercise: Other - see comments;walking;strength training/weights (plays golf 2-3 times a week), Frequency (Times/Week): 4, Intensity: Mild, Exercise limited by: None identified   Goals Addressed   None    Depression Screen    05/28/2022    8:43 AM 05/27/2021    8:29 AM 05/16/2020    8:36 AM 12/05/2018    9:42 AM 06/11/2017    2:08 PM 11/01/2015   10:38 AM  PHQ 2/9 Scores  PHQ - 2 Score 0 0 0 0 0 0    Fall Risk    05/27/2022    4:08 PM 05/27/2021    8:29 AM 05/16/2020    8:34 AM 12/05/2018    9:42 AM 09/15/2018    6:51 PM  Fall Risk   Falls in the past year? 0 0 0 0   Comment     Emmi Telephone Survey: data to providers prior to load  Number falls in past yr: 0 0 0 0    Comment     Emmi Telephone Survey Actual Response =   Injury with Fall? 0 0 0 0   Risk for fall due to : No Fall Risks No Fall Risks     Follow up Falls evaluation completed Falls evaluation completed Falls prevention discussed      FALL RISK PREVENTION PERTAINING TO THE HOME:  Any stairs in or around the home? Yes  If so, are there any without handrails? No  Home free of loose throw rugs in walkways, pet beds, electrical cords, etc? Yes  Adequate lighting in your home to reduce risk of falls? Yes   ASSISTIVE DEVICES UTILIZED TO PREVENT FALLS:  Life alert? No  Use of a cane, walker or w/c? No  Grab bars in the bathroom? No  Shower chair or bench in shower? Yes  Elevated toilet seat or a handicapped toilet? No   TIMED UP AND GO:  Was the test performed?  No, audio visit .    Cognitive Function:    05/28/2022    8:51 AM 11/01/2015   10:39 AM  MMSE - Mini Mental State Exam  Not completed: Unable to complete   Orientation to time  5  Orientation to Place  5  Registration  3  Attention/ Calculation  5  Recall  3  Language- name 2 objects  2  Language- repeat  1  Language- follow 3 step command  3  Language- read & follow direction  1  Write a sentence  1  Copy design  1  Total score  30        05/27/2021    8:36 AM  6CIT Screen  What Year? 0 points  What month? 0 points  What time? 0 points  Count back from  20 0 points  Months in reverse 0 points  Repeat phrase 0 points  Total Score 0 points    Immunizations Immunization History  Administered Date(s) Administered   Influenza, High Dose Seasonal PF 11/01/2015, 01/30/2017, 01/06/2018, 11/15/2018   Influenza-Unspecified 01/30/2017, 11/15/2018, 01/01/2020, 01/16/2021, 01/02/2022   Moderna Sars-Covid-2 Vaccination 04/10/2019, 03/18/2020   Pneumococcal Conjugate-13 06/08/2013   Pneumococcal Polysaccharide-23 01/27/2012   Td 02/16/2006   Tdap 05/04/2016   Zoster Recombinat (Shingrix) 02/13/2019, 04/14/2019    Zoster, Live 12/09/2009    TDAP status: Up to date  Flu Vaccine status: Up to date  Pneumococcal vaccine status: Up to date  Covid-19 vaccine status: Information provided on how to obtain vaccines.   Qualifies for Shingles Vaccine? Yes   Zostavax completed Yes   Shingrix Completed?: Yes  Screening Tests Health Maintenance  Topic Date Due   COVID-19 Vaccine (3 - Moderna risk series) 04/15/2020   COLONOSCOPY (Pts 45-58yrs Insurance coverage will need to be confirmed)  12/29/2021   Medicare Annual Wellness (AWV)  05/28/2022   INFLUENZA VACCINE  09/17/2022   DTaP/Tdap/Td (3 - Td or Tdap) 05/05/2026   Pneumonia Vaccine 65+ Years old  Completed   Hepatitis C Screening  Completed   Zoster Vaccines- Shingrix  Completed   HPV VACCINES  Aged Out    Health Maintenance  Health Maintenance Due  Topic Date Due   COVID-19 Vaccine (3 - Moderna risk series) 04/15/2020   COLONOSCOPY (Pts 45-58yrs Insurance coverage will need to be confirmed)  12/29/2021   Medicare Annual Wellness (AWV)  05/28/2022    Colorectal cancer screening: Type of screening: Colonoscopy. Completed 12/29/16. Repeat every 5 years Schedule for 06/02/22.  Lung Cancer Screening: (Low Dose CT Chest recommended if Age 53-80 years, 30 pack-year currently smoking OR have quit w/in 15years.) does not qualify.   Additional Screening:  Hepatitis C Screening: does qualify; Completed 05/09/12  Vision Screening: Recommended annual ophthalmology exams for early detection of glaucoma and other disorders of the eye. Is the patient up to date with their annual eye exam?  Yes  Who is the provider or what is the name of the office in which the patient attends annual eye exams? Dr. Manning Charity If pt is not established with a provider, would they like to be referred to a provider to establish care? No .   Dental Screening: Recommended annual dental exams for proper oral hygiene  Community Resource Referral / Chronic Care  Management: CRR required this visit?  No   CCM required this visit?  No      Plan:     I have personally reviewed and noted the following in the patient's chart:   Medical and social history Use of alcohol, tobacco or illicit drugs  Current medications and supplements including opioid prescriptions. Patient is not currently taking opioid prescriptions. Functional ability and status Nutritional status Physical activity Advanced directives List of other physicians Hospitalizations, surgeries, and ER visits in previous 12 months Vitals Screenings to include cognitive, depression, and falls Referrals and appointments  In addition, I have reviewed and discussed with patient certain preventive protocols, quality metrics, and best practice recommendations. A written personalized care plan for preventive services as well as general preventive health recommendations were provided to patient.   Due to this being a telephonic visit, the after visit summary with patients personalized plan was offered to patient via mail or my-chart. Patient would like to access on my-chart.  Donne Anon, [place]   05/28/2022   Nurse Notes:  None

## 2022-05-28 NOTE — Patient Instructions (Signed)
Ruben Henry , Thank you for taking time to come for your Medicare Wellness Visit. I appreciate your ongoing commitment to your health goals. Please review the following plan we discussed and let me know if I can assist you in the future.     This is a list of the screening recommended for you and due dates:  Health Maintenance  Topic Date Due   COVID-19 Vaccine (3 - Moderna risk series) 04/15/2020   Colon Cancer Screening  12/29/2021   Flu Shot  09/17/2022   Medicare Annual Wellness Visit  05/28/2023   DTaP/Tdap/Td vaccine (3 - Td or Tdap) 05/05/2026   Pneumonia Vaccine  Completed   Hepatitis C Screening: USPSTF Recommendation to screen - Ages 78-79 yo.  Completed   Zoster (Shingles) Vaccine  Completed   HPV Vaccine  Aged Out    Next appointment: Follow up in one year for your annual wellness visit.   Preventive Care 26 Years and Older, Male Preventive care refers to lifestyle choices and visits with your health care provider that can promote health and wellness. What does preventive care include? A yearly physical exam. This is also called an annual well check. Dental exams once or twice a year. Routine eye exams. Ask your health care provider how often you should have your eyes checked. Personal lifestyle choices, including: Daily care of your teeth and gums. Regular physical activity. Eating a healthy diet. Avoiding tobacco and drug use. Limiting alcohol use. Practicing safe sex. Taking low doses of aspirin every day. Taking vitamin and mineral supplements as recommended by your health care provider. What happens during an annual well check? The services and screenings done by your health care provider during your annual well check will depend on your age, overall health, lifestyle risk factors, and family history of disease. Counseling  Your health care provider may ask you questions about your: Alcohol use. Tobacco use. Drug use. Emotional well-being. Home and  relationship well-being. Sexual activity. Eating habits. History of falls. Memory and ability to understand (cognition). Work and work Astronomer. Screening  You may have the following tests or measurements: Height, weight, and BMI. Blood pressure. Lipid and cholesterol levels. These may be checked every 5 years, or more frequently if you are over 83 years old. Skin check. Lung cancer screening. You may have this screening every year starting at age 37 if you have a 30-pack-year history of smoking and currently smoke or have quit within the past 15 years. Fecal occult blood test (FOBT) of the stool. You may have this test every year starting at age 62. Flexible sigmoidoscopy or colonoscopy. You may have a sigmoidoscopy every 5 years or a colonoscopy every 10 years starting at age 38. Prostate cancer screening. Recommendations will vary depending on your family history and other risks. Hepatitis C blood test. Hepatitis B blood test. Sexually transmitted disease (STD) testing. Diabetes screening. This is done by checking your blood sugar (glucose) after you have not eaten for a while (fasting). You may have this done every 1-3 years. Abdominal aortic aneurysm (AAA) screening. You may need this if you are a current or former smoker. Osteoporosis. You may be screened starting at age 38 if you are at high risk. Talk with your health care provider about your test results, treatment options, and if necessary, the need for more tests. Vaccines  Your health care provider may recommend certain vaccines, such as: Influenza vaccine. This is recommended every year. Tetanus, diphtheria, and acellular pertussis (Tdap, Td) vaccine.  You may need a Td booster every 10 years. Zoster vaccine. You may need this after age 18. Pneumococcal 13-valent conjugate (PCV13) vaccine. One dose is recommended after age 66. Pneumococcal polysaccharide (PPSV23) vaccine. One dose is recommended after age 12. Talk to your  health care provider about which screenings and vaccines you need and how often you need them. This information is not intended to replace advice given to you by your health care provider. Make sure you discuss any questions you have with your health care provider. Document Released: 03/01/2015 Document Revised: 10/23/2015 Document Reviewed: 12/04/2014 Elsevier Interactive Patient Education  2017 [place] Prevention in the Home Falls can cause injuries. They can happen to people of all ages. There are many things you can do to make your home safe and to help prevent falls. What can I do on the outside of my home? Regularly fix the edges of walkways and driveways and fix any cracks. Remove anything that might make you trip as you walk through a door, such as a raised step or threshold. Trim any bushes or trees on the path to your home. Use bright outdoor lighting. Clear any walking paths of anything that might make someone trip, such as rocks or tools. Regularly check to see if handrails are loose or broken. Make sure that both sides of any steps have handrails. Any raised decks and porches should have guardrails on the edges. Have any leaves, snow, or ice cleared regularly. Use sand or salt on walking paths during winter. Clean up any spills in your garage right away. This includes oil or grease spills. What can I do in the bathroom? Use night lights. Install grab bars by the toilet and in the tub and shower. Do not use towel bars as grab bars. Use non-skid mats or decals in the tub or shower. If you need to sit down in the shower, use a plastic, non-slip stool. Keep the floor dry. Clean up any water that spills on the floor as soon as it happens. Remove soap buildup in the tub or shower regularly. Attach bath mats securely with double-sided non-slip rug tape. Do not have throw rugs and other things on the floor that can make you trip. What can I do in the bedroom? Use night  lights. Make sure that you have a light by your bed that is easy to reach. Do not use any sheets or blankets that are too big for your bed. They should not hang down onto the floor. Have a firm chair that has side arms. You can use this for support while you get dressed. Do not have throw rugs and other things on the floor that can make you trip. What can I do in the kitchen? Clean up any spills right away. Avoid walking on wet floors. Keep items that you use a lot in easy-to-reach places. If you need to reach something above you, use a strong step stool that has a grab bar. Keep electrical cords out of the way. Do not use floor polish or wax that makes floors slippery. If you must use wax, use non-skid floor wax. Do not have throw rugs and other things on the floor that can make you trip. What can I do with my stairs? Do not leave any items on the stairs. Make sure that there are handrails on both sides of the stairs and use them. Fix handrails that are broken or loose. Make sure that handrails are as long as  the stairways. Check any carpeting to make sure that it is firmly attached to the stairs. Fix any carpet that is loose or worn. Avoid having throw rugs at the top or bottom of the stairs. If you do have throw rugs, attach them to the floor with carpet tape. Make sure that you have a light switch at the top of the stairs and the bottom of the stairs. If you do not have them, ask someone to add them for you. What else can I do to help prevent falls? Wear shoes that: Do not have high heels. Have rubber bottoms. Are comfortable and fit you well. Are closed at the toe. Do not wear sandals. If you use a stepladder: Make sure that it is fully opened. Do not climb a closed stepladder. Make sure that both sides of the stepladder are locked into place. Ask someone to hold it for you, if possible. Clearly mark and make sure that you can see: Any grab bars or handrails. First and last  steps. Where the edge of each step is. Use tools that help you move around (mobility aids) if they are needed. These include: Canes. Walkers. Scooters. Crutches. Turn on the lights when you go into a dark area. Replace any light bulbs as soon as they burn out. Set up your furniture so you have a clear path. Avoid moving your furniture around. If any of your floors are uneven, fix them. If there are any pets around you, be aware of where they are. Review your medicines with your doctor. Some medicines can make you feel dizzy. This can increase your chance of falling. Ask your doctor what other things that you can do to help prevent falls. This information is not intended to replace advice given to you by your health care provider. Make sure you discuss any questions you have with your health care provider. Document Released: 11/29/2008 Document Revised: 07/11/2015 Document Reviewed: 03/09/2014 Elsevier Interactive Patient Education  2017 [employer].

## 2022-06-02 DIAGNOSIS — D125 Benign neoplasm of sigmoid colon: Secondary | ICD-10-CM | POA: Diagnosis not present

## 2022-06-02 DIAGNOSIS — K644 Residual hemorrhoidal skin tags: Secondary | ICD-10-CM | POA: Diagnosis not present

## 2022-06-02 DIAGNOSIS — Z86010 Personal history of colonic polyps: Secondary | ICD-10-CM | POA: Diagnosis not present

## 2022-06-02 DIAGNOSIS — D123 Benign neoplasm of transverse colon: Secondary | ICD-10-CM | POA: Diagnosis not present

## 2022-06-02 DIAGNOSIS — K648 Other hemorrhoids: Secondary | ICD-10-CM | POA: Diagnosis not present

## 2022-06-02 DIAGNOSIS — D120 Benign neoplasm of cecum: Secondary | ICD-10-CM | POA: Diagnosis not present

## 2022-06-02 DIAGNOSIS — D122 Benign neoplasm of ascending colon: Secondary | ICD-10-CM | POA: Diagnosis not present

## 2022-06-02 DIAGNOSIS — Z09 Encounter for follow-up examination after completed treatment for conditions other than malignant neoplasm: Secondary | ICD-10-CM | POA: Diagnosis not present

## 2022-06-02 DIAGNOSIS — K5730 Diverticulosis of large intestine without perforation or abscess without bleeding: Secondary | ICD-10-CM | POA: Diagnosis not present

## 2022-06-02 LAB — HM COLONOSCOPY

## 2022-06-04 DIAGNOSIS — D125 Benign neoplasm of sigmoid colon: Secondary | ICD-10-CM | POA: Diagnosis not present

## 2022-06-25 ENCOUNTER — Encounter (HOSPITAL_BASED_OUTPATIENT_CLINIC_OR_DEPARTMENT_OTHER): Payer: Self-pay

## 2022-06-25 ENCOUNTER — Other Ambulatory Visit (HOSPITAL_BASED_OUTPATIENT_CLINIC_OR_DEPARTMENT_OTHER): Payer: Self-pay

## 2022-06-25 ENCOUNTER — Emergency Department (HOSPITAL_BASED_OUTPATIENT_CLINIC_OR_DEPARTMENT_OTHER)
Admission: EM | Admit: 2022-06-25 | Discharge: 2022-06-25 | Disposition: A | Discharge status: Home / Self Care | Payer: Medicare Other | Attending: Emergency Medicine | Admitting: Emergency Medicine

## 2022-06-25 ENCOUNTER — Emergency Department (HOSPITAL_BASED_OUTPATIENT_CLINIC_OR_DEPARTMENT_OTHER): Payer: Medicare Other

## 2022-06-25 DIAGNOSIS — Z7951 Long term (current) use of inhaled steroids: Secondary | ICD-10-CM | POA: Insufficient documentation

## 2022-06-25 DIAGNOSIS — J4541 Moderate persistent asthma with (acute) exacerbation: Secondary | ICD-10-CM | POA: Diagnosis not present

## 2022-06-25 DIAGNOSIS — Z7982 Long term (current) use of aspirin: Secondary | ICD-10-CM | POA: Insufficient documentation

## 2022-06-25 DIAGNOSIS — J45909 Unspecified asthma, uncomplicated: Secondary | ICD-10-CM | POA: Diagnosis not present

## 2022-06-25 DIAGNOSIS — R0602 Shortness of breath: Secondary | ICD-10-CM | POA: Diagnosis not present

## 2022-06-25 MED ORDER — PREDNISONE 20 MG PO TABS
40.0000 mg | ORAL_TABLET | Freq: Every day | ORAL | 0 refills | Status: DC
  Filled 2022-06-25: qty 8, 4d supply, fill #0

## 2022-06-25 MED ORDER — IPRATROPIUM-ALBUTEROL 0.5-2.5 (3) MG/3ML IN SOLN
3.0000 mL | Freq: Once | RESPIRATORY_TRACT | Status: AC
  Administered 2022-06-25: 3 mL via RESPIRATORY_TRACT
  Filled 2022-06-25: qty 3

## 2022-06-25 MED ORDER — ALBUTEROL SULFATE (2.5 MG/3ML) 0.083% IN NEBU
2.5000 mg | INHALATION_SOLUTION | Freq: Once | RESPIRATORY_TRACT | Status: AC
  Administered 2022-06-25: 2.5 mg via RESPIRATORY_TRACT
  Filled 2022-06-25: qty 3

## 2022-06-25 MED ORDER — PREDNISONE 50 MG PO TABS
60.0000 mg | ORAL_TABLET | Freq: Once | ORAL | Status: AC
  Administered 2022-06-25: 60 mg via ORAL
  Filled 2022-06-25: qty 1

## 2022-06-25 NOTE — ED Triage Notes (Signed)
States became short of breath yesterday, been taking symbicort, albuterol inhaler, & nebulizer without relief. Last took nebulizer at 0700.

## 2022-06-25 NOTE — ED Notes (Signed)

## 2022-06-25 NOTE — ED Provider Notes (Signed)
[facility] Provider Note   CSN: [id number] Arrival date & time: 06/25/22  0831     History  Chief Complaint  Patient presents with   Shortness of Breath    Ruben Henry is a 77 y.o. male.  HPI 77 year old male with a history of asthma presents with an asthma exacerbation.  Symptoms started yesterday after playing golf.  He had to stop golf early.  He has had cough with some occasional sputum and wheezing.  He denies any chest pain or tightness.  No fevers.  Has not had any leg swelling.  Has been using his albuterol but is not helping.  He is also taking Symbicort.  He has had a couple exacerbations since February and is concerned that he may need further treatment.  He has received a breathing treatment prior to me seeing him and states he is feeling somewhat better but knows that if he got up to walk he would be very short of breath.  Home Medications Prior to Admission medications   Medication Sig Start Date End Date Taking? Authorizing Provider  predniSONE (DELTASONE) 20 MG tablet Take 2 tablets (40 mg total) by mouth daily for 4 days. 06/26/22  Yes Pricilla Loveless, MD  albuterol (VENTOLIN HFA) 108 (90 Base) MCG/ACT inhaler USE 2 INHALATIONS BY MOUTH EVERY 6 HOURS AS NEEDED FOR WHEEZING  OR SHORTNESS OF BREATH 05/11/22   Saguier, Ramon Dredge, PA-C  aspirin 81 MG tablet Take 81 mg by mouth every other day.     [provider]  budesonide-formoterol (SYMBICORT) 80-4.5 MCG/ACT inhaler Take 2 puffs first thing in am and then another 2 puffs about 12 hours later. 02/18/22   Nyoka Cowden, MD  ezetimibe (ZETIA) 10 MG tablet TAKE 1 TABLET BY MOUTH DAILY 11/04/21   Saguier, Ramon Dredge, PA-C  fluticasone [facility]) 50 MCG/ACT nasal spray Place 2 sprays into both nostrils daily. Patient taking differently: Place 2 sprays into both nostrils daily as needed. 12/20/20   Saguier, Ramon Dredge, PA-C  losartan (COZAAR) 25 MG tablet TAKE 1 TABLET BY MOUTH DAILY  11/04/21   Saguier, Ramon Dredge, PA-C  Omega-3 Fatty Acids (OMEGA 3 PO) Take 1 capsule by mouth daily.    [provider]  senna (SENOKOT) 8.6 MG tablet Take 1 tablet by mouth daily.    [provider]      Allergies    Patient has no known allergies.    Review of Systems   Review of Systems  Constitutional:  Negative for fever.  Respiratory:  Positive for cough, shortness of breath and wheezing.     Physical Exam Updated Vital Signs BP (!) 162/91 (BP Location: Left Arm)   Pulse (!) 103   Temp 98.2 F (36.8 C)   Resp 20   Ht 5\' 10"  (1.778 m)   Wt 87.1 kg   SpO2 93%   BMI 27.55 kg/m  Physical Exam Vitals and nursing note reviewed.  Constitutional:      Appearance: He is well-developed.  HENT:     Head: Normocephalic and atraumatic.  Cardiovascular:     Rate and Rhythm: Normal rate and regular rhythm.     Heart sounds: Normal heart sounds.  Pulmonary:     Effort: Pulmonary effort is normal.     Breath sounds: Wheezing (diffuse, expiratory) present.  Musculoskeletal:     Right lower leg: No edema.     Left lower leg: No edema.  Skin:    General: Skin is  warm and dry.  Neurological:     Mental Status: He is alert.     ED Results / Procedures / Treatments   Labs (all labs ordered are listed, but only abnormal results are displayed) Labs Reviewed - No data to display  EKG EKG Interpretation  Date/Time:  Thursday Jun 25 2022 08:45:11 EDT Ventricular Rate:  91 PR Interval:  175 QRS Duration: 97 QT Interval:  376 QTC Calculation: 463 R Axis:   71 Text Interpretation: Sinus rhythm Anterior infarct, old Baseline wander in lead(s) V6 no acute ST/T changes Confirmed by Pricilla Loveless [phone]) on 06/25/2022 10:09:09 AM  Radiology DG Chest Portable 1 View  Result Date: 06/25/2022 CLINICAL DATA:  Polyp.  Asthma. EXAM: PORTABLE CHEST 1 VIEW COMPARISON:  CXR 03/19/22 FINDINGS: No pleural effusion. No pneumothorax. No focal airspace opacity. Normal cardiac and  mediastinal contours. No radiographically apparent displaced rib fractures. Visualized upper abdomen is unremarkable. Degenerative changes of the bilateral glenohumeral joints. IMPRESSION: No focal airspace opacity Electronically Signed   By: Lorenza Cambridge M.D.   On: 06/25/2022 10:21    Procedures Procedures    Medications Ordered in ED Medications  ipratropium-albuterol (DUONEB) 0.5-2.5 (3) MG/3ML nebulizer solution 3 mL (3 mLs Nebulization Given 06/25/22 0856)  albuterol (PROVENTIL) (2.5 MG/3ML) 0.083% nebulizer solution 2.5 mg (2.5 mg Nebulization Given 06/25/22 0856)  ipratropium-albuterol (DUONEB) 0.5-2.5 (3) MG/3ML nebulizer solution 3 mL (3 mLs Nebulization Given 06/25/22 1010)  albuterol (PROVENTIL) (2.5 MG/3ML) 0.083% nebulizer solution 2.5 mg (2.5 mg Nebulization Given 06/25/22 1010)  predniSONE (DELTASONE) tablet 60 mg (60 mg Oral Given 06/25/22 1026)    ED Course/ Medical Decision Making/ A&P                             Medical Decision Making Amount and/or Complexity of Data Reviewed Radiology: ordered and independent interpretation performed.    Details: No pneumonia ECG/medicine tests: ordered and independent interpretation performed.    Details: No acute ischemia  Risk Prescription drug management.   Patient presents with a recurrent asthma exacerbation.  Sound like it was related to allergies/seasonal from playing golf.  No infectious symptoms.  He was given a couple DuoNebs as well as prednisone.  There is no pneumonia.  He feels a lot better.  He has some scant residual wheezing but was able to ambulate without difficulty and feels well enough for discharge.  His heart rate did go up although he has also received some albuterol.  It has come back down with rest.  Offered more treatments but he feels well enough for discharge.  Will have him follow-up with his pulmonologist and I will give him a burst of steroids.  Otherwise will give return precautions.        Final  Clinical Impression(s) / ED Diagnoses Final diagnoses:  Moderate persistent asthma with exacerbation    Rx / DC Orders ED Discharge Orders          Ordered    predniSONE (DELTASONE) 20 MG tablet  Daily        06/25/22 1157              Pricilla Loveless, MD 06/25/22 1208

## 2022-06-25 NOTE — ED Notes (Signed)
Cannot discharge, registration in chart.   

## 2022-06-25 NOTE — ED Notes (Signed)
   06/25/22 1147  Resting  Supplemental oxygen during test? No  Resting Heart Rate 106  Resting Sp02 92  Lap 1 (250 feet)  HR 123  02 Sat 93   Ambulated SPO2 91% at lowest, denies increased WOB, BBS faint exp wheeze after.

## 2022-06-25 NOTE — Discharge Instructions (Addendum)
Continue to use her Symbicort as previously instructed.  Use the albuterol inhaler or the nebulizer (they both are rescue medications) every 4 hours for cough, shortness of breath, wheezing.  If he needed significantly more than this return to the ER.  You were given your first dose of prednisone today, start the next dose tomorrow, 5/10.  If you develop new or worsening shortness of breath, fever, coughing up blood, chest pain, or any other new/concerning symptoms then return to the ER or call 911.

## 2022-07-08 NOTE — Progress Notes (Signed)
Synopsis: Referred for chronic cough by Esperanza Richters, PA-C  Subjective:   PATIENT ID: Ruben Henry GENDER: male DOB: 10/29/1945, MRN: [id number]  No chief complaint on file.  77yM with history of ACD s/p PCI 1998 and low risk myoview 2018, HTN, history of seed implants for prostate cancer maybe 10 ya, never smoker referred for cough, DOE. Has not had covid-19 infection.   He says he has had coughing at night for years. Usually wakes him up. Feels chest and sinonasal congestion. Sometimes has some associated sneezing. Uses flonase periodically - a few days at a time or maybe everyday over the spring or summer. Is taking xyzal. Not taking montelukast - he is unsure if it was helpful in the past. Does not have any overtly symptomatic reflux. Has never had empiric trial of ppi.   Has improved over last couple of weeks. Has been on flovent 2 puffs twice daily for years. He does think this has been very helpful for cough.   Minimal DOE  He has no family history of lung disease  He worked as an Microbiologist. He grew up in [place], lived in [place] for a couple years, moved to [place] in 1978. No recent travel. Never smoker. He has no pets at home.   Interval HPI:   At ov with [facility] 12/05/21 given prednisone taper and changed to symbicort in setting asthma exacerbation.  To UC at [facility] for dyspnea and wheeze thought to have asthma exacerbation 2/1 given prednisone taper  Done with course of steroids, pretty much back to baseline.  --------------- Another exacerbation needing steroid 5/9 and ED visit  Otherwise pertinent review of systems is negative.  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   CAD (coronary artery disease) 07/20/2013   s/p remote PCI of RCA and LCX in 1998   Hypercholesterolemia    Hypertension    Osteoarthritis of right hip     Prostate cancer (HCC)    brachytherapy     Family History  Problem Relation Age of Onset   Heart failure Mother    Esophageal cancer Father        died age 38, exp to asbestos   Cancer Father    Diabetes Sister    Allergies Sister    Heart attack Maternal Uncle 65   Coronary artery disease Maternal Uncle 63       with CABG     Past Surgical History:  Procedure Laterality Date   CORONARY ANGIOPLASTY WITH STENT PLACEMENT  06/21/1996   prior angioplasty of the RCA with stenting of the LCX in 1998   EYE SURGERY  1969   Right eye /    TONSILLECTOMY AND ADENOIDECTOMY      Social History   Socioeconomic History   Marital status: Married    Spouse name: Not on file   Number of children: 2   Years of education: Not on file   Highest education level: Not on file  Occupational History   Occupation: Garment/textile technologist: [employer]  Tobacco Use   Smoking status: Never   Smokeless tobacco: Never  Vaping Use   Vaping Use: Never used  Substance and Sexual Activity   Alcohol use: Yes    Alcohol/week: 14.0 standard drinks of alcohol    Types: 14 Cans of beer per week  Comment: couple of beers a day. [employer].   Drug use: No   Sexual activity: Yes  Other Topics Concern   Not on file  Social History Narrative   Not on file   Social Determinants of Health   Financial Resource Strain: Low Risk  (05/27/2022)   Overall Financial Resource Strain (CARDIA)    Difficulty of Paying Living Expenses: Not hard at all  Food Insecurity: No Food Insecurity (05/27/2022)   Hunger Vital Sign    Worried About Running Out of Food in the Last Year: Never true    Ran Out of Food in the Last Year: Never true  Transportation Needs: No Transportation Needs (05/27/2022)   PRAPARE - Administrator, Civil Service (Medical): No    Lack of Transportation (Non-Medical): No  Physical Activity: Insufficiently Active (05/27/2022)   Exercise Vital Sign    Days of Exercise per  Week: 4 days    Minutes of Exercise per Session: 30 min  Stress: No Stress Concern Present (05/27/2022)   [employer] of Occupational Health - Occupational Stress Questionnaire    Feeling of Stress : Not at all  Social Connections: Unknown (05/27/2022)   Social Connection and Isolation Panel [NHANES]    Frequency of Communication with Friends and Family: Three times a week    Frequency of Social Gatherings with Friends and Family: Twice a week    Attends Religious Services: Not on Marketing executive or Organizations: Yes    Attends Banker Meetings: 1 to 4 times per year    Marital Status: Married  Catering manager Violence: Not At Risk (05/28/2022)   Humiliation, Afraid, Rape, and Kick questionnaire    Fear of Current or Ex-Partner: No    Emotionally Abused: No    Physically Abused: No    Sexually Abused: No     No Known Allergies   Outpatient Medications Prior to Visit  Medication Sig Dispense Refill   albuterol (VENTOLIN HFA) 108 (90 Base) MCG/ACT inhaler USE 2 INHALATIONS BY MOUTH EVERY 6 HOURS AS NEEDED FOR WHEEZING  OR SHORTNESS OF BREATH 34 g 2   aspirin 81 MG tablet Take 81 mg by mouth every other day.      budesonide-formoterol (SYMBICORT) 80-4.5 MCG/ACT inhaler Take 2 puffs first thing in am and then another 2 puffs about 12 hours later. 30.6 g 3   ezetimibe (ZETIA) 10 MG tablet TAKE 1 TABLET BY MOUTH DAILY 100 tablet 2   fluticasone (FLONASE) 50 MCG/ACT nasal spray Place 2 sprays into both nostrils daily. (Patient taking differently: Place 2 sprays into both nostrils daily as needed.) 16 g 2   losartan (COZAAR) 25 MG tablet TAKE 1 TABLET BY MOUTH DAILY 100 tablet 2   Omega-3 Fatty Acids (OMEGA 3 PO) Take 1 capsule by mouth daily.     predniSONE (DELTASONE) 20 MG tablet Take 2 tablets (40 mg total) by mouth daily for 4 days. 8 tablet 0   senna (SENOKOT) 8.6 MG tablet Take 1 tablet by mouth daily.     No facility-administered medications prior  to visit.       Objective:   Physical Exam:  General appearance: 77 y.o., male, NAD, conversant  Eyes: anicteric sclerae; PERRL, tracking appropriately HENT: NCAT; MMM Neck: Trachea midline; no lymphadenopathy, no JVD Lungs: CTAB, + expiratory wheeze bl, with normal respiratory effort CV: RRR, no murmur  Abdomen: Soft, non-tender; non-distended, BS present  Extremities: No peripheral edema, warm  Skin: Normal turgor and texture; no rash Psych: Appropriate affect Neuro: Alert and oriented to person and place, no focal deficit     There were no vitals filed for this visit.      on RA BMI Readings from Last 3 Encounters:  06/25/22 27.55 kg/m  05/28/22 27.21 kg/m  03/31/22 28.27 kg/m   Wt Readings from Last 3 Encounters:  06/25/22 192 lb (87.1 kg)  05/28/22 191 lb (86.6 kg)  03/31/22 198 lb 6.4 oz (90 kg)     CBC    Component Value Date/Time   WBC 5.5 03/19/2022 0948   RBC 4.27 03/19/2022 0948   HGB 14.4 03/19/2022 0948   HCT 41.9 03/19/2022 0948   PLT 182 03/19/2022 0948   MCV 98.1 03/19/2022 0948   MCH 33.7 03/19/2022 0948   MCHC 34.4 03/19/2022 0948   RDW 14.4 03/19/2022 0948   LYMPHSABS 1.1 03/19/2022 0948   MONOABS 0.7 03/19/2022 0948   EOSABS 0.4 03/19/2022 0948   BASOSABS 0.1 03/19/2022 0948    Eos 400 03/19/22  Chest Imaging: CXR 2019 reviewed by me with 2 metallic foreign bodies on right which are likely migrated brachytherapy beads  CXR 03/23/20 reviewed by me stable with exception of questionable RLL opacity vs rib shadow  CXR 03/19/22 unremarkable  CXR 06/25/22 unremarkable  Pulmonary Functions Testing Results:    Latest Ref Rng & Units 05/27/2021    1:55 PM  PFT Results  FVC-Pre L 3.22   FVC-Predicted Pre % 75   FVC-Post L 3.52   FVC-Predicted Post % 82   Pre FEV1/FVC % % 64   Post FEV1/FCV % % 64   FEV1-Pre L 2.06   FEV1-Predicted Pre % 66   FEV1-Post L 2.25   DLCO uncorrected ml/min/mmHg 28.64   DLCO UNC% % 113   DLCO corrected  ml/min/mmHg 28.64   DLCO COR %Predicted % 113   DLVA Predicted % 127   TLC L 7.36   TLC % Predicted % 104   RV % Predicted % 145         Assessment & Plan:   # Chronic cough # Moderate persistent asthma/ACOS # 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 If another exacerbation before next visit then consider increasing symbicort dose   RTC 3 months    Omar Person, MD [facility] 07/08/2022 5:18 PM

## 2022-07-09 ENCOUNTER — Encounter: Payer: Self-pay | Admitting: Student

## 2022-07-09 ENCOUNTER — Ambulatory Visit: Payer: Medicare Other | Admitting: Student

## 2022-07-09 VITALS — BP 146/80 | HR 80 | Temp 97.7°F | Ht 70.0 in | Wt 193.8 lb

## 2022-07-09 DIAGNOSIS — R053 Chronic cough: Secondary | ICD-10-CM | POA: Diagnosis not present

## 2022-07-09 DIAGNOSIS — J45998 Other asthma: Secondary | ICD-10-CM | POA: Diagnosis not present

## 2022-07-09 DIAGNOSIS — R0982 Postnasal drip: Secondary | ICD-10-CM | POA: Diagnosis not present

## 2022-07-09 MED ORDER — BUDESONIDE-FORMOTEROL FUMARATE 160-4.5 MCG/ACT IN AERO: 2.0000 | INHALATION_SPRAY | Freq: Two times a day (BID) | RESPIRATORY_TRACT | 11 refills | Status: DC

## 2022-07-09 MED ORDER — BUDESONIDE-FORMOTEROL FUMARATE 160-4.5 MCG/ACT IN AERO: 2.0000 | INHALATION_SPRAY | Freq: Two times a day (BID) | RESPIRATORY_TRACT | 3 refills | Status: DC

## 2022-07-09 NOTE — Patient Instructions (Addendum)
-   if postnasal drainage increases then resume flonase 1 spray each nostril after clearing your nose out of crusting following a shower - ok to continue non-sedating antihistamine like zyrtec or xyzal, allegra during pollen season - start symbicort 160 2 puffs twice daily, rinse mouth and brush tongue/teeth after each use - if you're feeling like you're developing asthma can use double dose of symbicort for 3-4 days to get better symptom control and you may not end up needing prednisone

## 2022-07-29 ENCOUNTER — Encounter: Payer: Self-pay | Admitting: Student

## 2022-07-29 ENCOUNTER — Telehealth: Payer: Self-pay | Admitting: Student

## 2022-07-29 NOTE — Telephone Encounter (Signed)
Pt called in and wants to know whether or not he should be taking prednisone, Pls advise

## 2022-07-29 NOTE — Telephone Encounter (Signed)
Dr. Thora Lance please advise on what we can send this pt in to prevent asthma flare ups

## 2022-07-29 NOTE — Telephone Encounter (Signed)
Pt states he had a Asthma Attack this morning and got it under control with his Nebulizer. He also stated the reason for him calling in this morning was to get it prescribed instead of advice  Pharm[facility]: [facility] [address] in [place]

## 2022-07-30 MED ORDER — PREDNISONE 20 MG PO TABS: 20.0000 mg | ORAL_TABLET | Freq: Every day | ORAL | 0 refills | Status: DC

## 2022-07-30 NOTE — Telephone Encounter (Signed)
PT upset no one has called him back. I let him know we were waiting on Dr. He was upset he got no call back at all. Pls call. TY.

## 2022-07-30 NOTE — Telephone Encounter (Signed)
Spoke with pt who see DR. Meier for uncontrolled asthma. Pt stated that he had what he belieives was an asthma attack a couple days ago and since that has had a increase in SOB and lack of stamina. Pt did state that using Xopenex inhaler did help with  SOB and cough. Pt denies fever/ chills/ GI upset. Pt did state using Symbicort as Dr. Thora Lance had recommended which pt is stated did not help any. Pt is requesting Prednisone.   Tammy can you please advise as Dr. Thora Lance is unavailable today?

## 2022-07-30 NOTE — Telephone Encounter (Signed)
I called and spoke with the pt  He is aware of response per TP  Pt verbalized understanding  Rx sent to pharm

## 2022-07-30 NOTE — Telephone Encounter (Signed)
Encounter handled in telephone encounter from 07/29/22. Closing this encounter

## 2022-07-30 NOTE — Telephone Encounter (Signed)
Recent emergency room visit last month prescribed prednisone. Increase of cough shortness of breath-may be underlying asthma. Can send in prednisone 20 mg daily for 5 days. Patient he is taking his Symbicort on a regular basis.  Xopenex Rescue inhaler as needed Needs office visit for further evaluation and why his asthma is not under good control. Also -other things cause shortness of breath so if not improving will need further evaluation  Please contact office for sooner follow up if symptoms do not improve or worsen or seek emergency care

## 2022-08-11 NOTE — Progress Notes (Signed)
Synopsis: Referred for chronic cough by Esperanza Richters, PA-C  Subjective:   PATIENT ID: Ruben Henry GENDER: male DOB: 01-24-1946, MRN: [id number]  No chief complaint on file. CC: follow up after exacerbation   77yM with history of ACD s/p PCI 1998 and low risk myoview 2018, HTN, history of seed implants for prostate cancer maybe 10 ya, never smoker referred for cough, DOE. Has not had covid-19 infection.   He says he has had coughing at night for years. Usually wakes him up. Feels chest and sinonasal congestion. Sometimes has some associated sneezing. Uses flonase periodically - a few days at a time or maybe everyday over the spring or summer. Is taking xyzal. Not taking montelukast - he is unsure if it was helpful in the past. Does not have any overtly symptomatic reflux. Has never had empiric trial of ppi.   Has improved over last couple of weeks. Has been on flovent 2 puffs twice daily for years. He does think this has been very helpful for cough.   Minimal DOE  He has no family history of lung disease  He worked as an Microbiologist. He grew up in [place], lived in [place] for a couple years, moved to [place] in 1978. No recent travel. Never smoker. He has no pets at home.   Interval HPI:   Symbicort dose increased last visit but had another exacerbation 6/12. He overall however feels better on higher dose symbicort with far less cough.   Otherwise pertinent review of systems is negative.  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   CAD (coronary artery disease) 07/20/2013   s/p remote PCI of RCA and LCX in 1998   Hypercholesterolemia    Hypertension    Osteoarthritis of right hip    Prostate cancer (HCC)    brachytherapy     Family History  Problem Relation Age of Onset   Heart failure Mother    Esophageal cancer Father        died age 28, exp  to asbestos   Cancer Father    Diabetes Sister    Allergies Sister    Heart attack Maternal Uncle 76   Coronary artery disease Maternal Uncle 49       with CABG     Past Surgical History:  Procedure Laterality Date   CORONARY ANGIOPLASTY WITH STENT PLACEMENT  06/21/1996   prior angioplasty of the RCA with stenting of the LCX in 1998   EYE SURGERY  1969   Right eye /    TONSILLECTOMY AND ADENOIDECTOMY      Social History   Socioeconomic History   Marital status: Married    Spouse name: Not on file   Number of children: 2   Years of education: Not on file   Highest education level: Not on file  Occupational History   Occupation: Garment/textile technologist: [employer]  Tobacco Use   Smoking status: Never   Smokeless tobacco: Never  Vaping Use   Vaping Use: Never used  Substance and Sexual Activity   Alcohol use: Yes    Alcohol/week: 14.0 standard drinks of alcohol    Types: 14 Cans of beer per week    Comment: couple of beers a day. [employer].   Drug use: No   Sexual activity: Yes  Other Topics Concern   Not  on file  Social History Narrative   Not on file   Social Determinants of Health   Financial Resource Strain: Low Risk  (05/27/2022)   Overall Financial Resource Strain (CARDIA)    Difficulty of Paying Living Expenses: Not hard at all  Food Insecurity: No Food Insecurity (05/27/2022)   Hunger Vital Sign    Worried About Running Out of Food in the Last Year: Never true    Ran Out of Food in the Last Year: Never true  Transportation Needs: No Transportation Needs (05/27/2022)   PRAPARE - Administrator, Civil Service (Medical): No    Lack of Transportation (Non-Medical): No  Physical Activity: Insufficiently Active (05/27/2022)   Exercise Vital Sign    Days of Exercise per Week: 4 days    Minutes of Exercise per Session: 30 min  Stress: No Stress Concern Present (05/27/2022)   [employer] of Occupational Health - Occupational Stress  Questionnaire    Feeling of Stress : Not at all  Social Connections: Unknown (05/27/2022)   Social Connection and Isolation Panel [NHANES]    Frequency of Communication with Friends and Family: Three times a week    Frequency of Social Gatherings with Friends and Family: Twice a week    Attends Religious Services: Not on Marketing executive or Organizations: Yes    Attends Banker Meetings: 1 to 4 times per year    Marital Status: Married  Catering manager Violence: Not At Risk (05/28/2022)   Humiliation, Afraid, Rape, and Kick questionnaire    Fear of Current or Ex-Partner: No    Emotionally Abused: No    Physically Abused: No    Sexually Abused: No     No Known Allergies   Outpatient Medications Prior to Visit  Medication Sig Dispense Refill   albuterol (VENTOLIN HFA) 108 (90 Base) MCG/ACT inhaler USE 2 INHALATIONS BY MOUTH EVERY 6 HOURS AS NEEDED FOR WHEEZING  OR SHORTNESS OF BREATH 34 g 2   aspirin 81 MG tablet Take 81 mg by mouth every other day.      budesonide-formoterol (SYMBICORT) 160-4.5 MCG/ACT inhaler Inhale 2 puffs into the lungs in the morning and at bedtime. 30.6 g 3   ezetimibe (ZETIA) 10 MG tablet TAKE 1 TABLET BY MOUTH DAILY 100 tablet 2   fluticasone (FLONASE) 50 MCG/ACT nasal spray Place 2 sprays into both nostrils daily. (Patient taking differently: Place 2 sprays into both nostrils daily as needed.) 16 g 2   ipratropium (ATROVENT) 0.02 % nebulizer solution Take 0.5 mg by nebulization every 4 (four) hours as needed for wheezing or shortness of breath.     levalbuterol (XOPENEX) 0.63 MG/3ML nebulizer solution Take 0.63 mg by nebulization every 4 (four) hours as needed for wheezing or shortness of breath.     losartan (COZAAR) 25 MG tablet TAKE 1 TABLET BY MOUTH DAILY 100 tablet 2   Omega-3 Fatty Acids (OMEGA 3 PO) Take 1 capsule by mouth daily.     predniSONE (DELTASONE) 20 MG tablet Take 1 tablet (20 mg total) by mouth daily with breakfast. 5  tablet 0   senna (SENOKOT) 8.6 MG tablet Take 1 tablet by mouth every other day.     budesonide-formoterol (SYMBICORT) 160-4.5 MCG/ACT inhaler Inhale 2 puffs into the lungs 2 (two) times daily. (Patient not taking: Reported on 08/13/2022) 1 each 11   No facility-administered medications prior to visit.       Objective:   Physical Exam:  General appearance: 77 y.o., male, NAD, conversant  Eyes: anicteric sclerae; PERRL, tracking appropriately HENT: NCAT; MMM Neck: Trachea midline; no lymphadenopathy, no JVD Lungs: CTAB with normal respiratory effort CV: RRR, no murmur  Abdomen: Soft, non-tender; non-distended, BS present  Extremities: No peripheral edema, warm Skin: Normal turgor and texture; no rash Psych: Appropriate affect Neuro: Alert and oriented to person and place, no focal deficit     Vitals:   08/13/22 1448  BP: (!) 140/80  Pulse: 76  SpO2: 96%  Weight: 194 lb 12.8 oz (88.4 kg)  Height: 5\' 10"  (1.778 m)       96% on RA BMI Readings from Last 3 Encounters:  08/13/22 27.95 kg/m  07/09/22 27.81 kg/m  06/25/22 27.55 kg/m   Wt Readings from Last 3 Encounters:  08/13/22 194 lb 12.8 oz (88.4 kg)  07/09/22 193 lb 12.8 oz (87.9 kg)  06/25/22 192 lb (87.1 kg)     CBC    Component Value Date/Time   WBC 5.5 03/19/2022 0948   RBC 4.27 03/19/2022 0948   HGB 14.4 03/19/2022 0948   HCT 41.9 03/19/2022 0948   PLT 182 03/19/2022 0948   MCV 98.1 03/19/2022 0948   MCH 33.7 03/19/2022 0948   MCHC 34.4 03/19/2022 0948   RDW 14.4 03/19/2022 0948   LYMPHSABS 1.1 03/19/2022 0948   MONOABS 0.7 03/19/2022 0948   EOSABS 0.4 03/19/2022 0948   BASOSABS 0.1 03/19/2022 0948    Eos 400 03/19/22  Chest Imaging: CXR 2019 reviewed by me with 2 metallic foreign bodies on right which are likely migrated brachytherapy beads  CXR 03/23/20 reviewed by me stable with exception of questionable RLL opacity vs rib shadow  CXR 03/19/22 unremarkable  CXR 06/25/22  unremarkable  Pulmonary Functions Testing Results:    Latest Ref Rng & Units 05/27/2021    1:55 PM  PFT Results  FVC-Pre L 3.22   FVC-Predicted Pre % 75   FVC-Post L 3.52   FVC-Predicted Post % 82   Pre FEV1/FVC % % 64   Post FEV1/FCV % % 64   FEV1-Pre L 2.06   FEV1-Predicted Pre % 66   FEV1-Post L 2.25   DLCO uncorrected ml/min/mmHg 28.64   DLCO UNC% % 113   DLCO corrected ml/min/mmHg 28.64   DLCO COR %Predicted % 113   DLVA Predicted % 127   TLC L 7.36   TLC % Predicted % 104   RV % Predicted % 145         Assessment & Plan:   # Chronic cough # 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 Can also use mucinex 600-1200mg  twice daily as needed and flutter valve 10 slow but firm puffs twice daily to get mucus out - if another exacerbation despite these measures then it may be time to consider a biologic for eosinophilic asthma and CRS   RTC with Dr. Celine Mans in August    Omar Person, MD [facility] 08/13/2022 4:35  PM

## 2022-08-12 ENCOUNTER — Other Ambulatory Visit: Payer: Self-pay | Admitting: Medical

## 2022-08-13 ENCOUNTER — Ambulatory Visit: Payer: Medicare Other | Admitting: Student

## 2022-08-13 ENCOUNTER — Other Ambulatory Visit: Payer: Self-pay | Admitting: Medical

## 2022-08-13 ENCOUNTER — Encounter: Payer: Self-pay | Admitting: Student

## 2022-08-13 VITALS — BP 140/80 | HR 76 | Ht 70.0 in | Wt 194.8 lb

## 2022-08-13 DIAGNOSIS — J4550 Severe persistent asthma, uncomplicated: Secondary | ICD-10-CM

## 2022-08-13 DIAGNOSIS — R053 Chronic cough: Secondary | ICD-10-CM

## 2022-08-13 DIAGNOSIS — J45901 Unspecified asthma with (acute) exacerbation: Secondary | ICD-10-CM | POA: Diagnosis not present

## 2022-08-13 NOTE — Patient Instructions (Signed)
-   if postnasal drainage increases then resume flonase 1 spray each nostril after clearing your nose out of crusting following a shower - ok to continue non-sedating antihistamine like zyrtec or xyzal, allegra during pollen season - start symbicort 160 2 puffs twice daily, rinse mouth and brush tongue/teeth after each use - if you're feeling like you're developing asthma can use double dose of symbicort for 3-4 days to get better symptom control and you may not end up needing prednisone. Can also use mucinex 600-1200mg  twice daily as needed and flutter valve 10 slow but firm puffs twice daily to get mucus out

## 2022-08-19 ENCOUNTER — Emergency Department (HOSPITAL_BASED_OUTPATIENT_CLINIC_OR_DEPARTMENT_OTHER)
Admission: EM | Admit: 2022-08-19 | Discharge: 2022-08-19 | Disposition: A | Discharge status: Home / Self Care | Payer: Medicare Other | Attending: Emergency Medicine | Admitting: Emergency Medicine

## 2022-08-19 ENCOUNTER — Other Ambulatory Visit: Payer: Self-pay

## 2022-08-19 DIAGNOSIS — I83892 Varicose veins of left lower extremities with other complications: Secondary | ICD-10-CM | POA: Insufficient documentation

## 2022-08-19 DIAGNOSIS — Z7982 Long term (current) use of aspirin: Secondary | ICD-10-CM | POA: Diagnosis not present

## 2022-08-19 DIAGNOSIS — S81832A Puncture wound without foreign body, left lower leg, initial encounter: Secondary | ICD-10-CM | POA: Diagnosis not present

## 2022-08-19 DIAGNOSIS — X58XXXA Exposure to other specified factors, initial encounter: Secondary | ICD-10-CM | POA: Insufficient documentation

## 2022-08-19 MED ORDER — LIDOCAINE-EPINEPHRINE (PF) 2 %-1:200000 IJ SOLN
INTRAMUSCULAR | Status: AC
  Filled 2022-08-19: qty 20

## 2022-08-19 MED ORDER — LIDOCAINE-EPINEPHRINE 2 %-1:100000 IJ SOLN: 20.0000 mL | Freq: Once | INTRAMUSCULAR | Status: DC

## 2022-08-19 MED ORDER — LIDOCAINE-EPINEPHRINE (PF) 2 %-1:200000 IJ SOLN
20.0000 mL | Freq: Once | INTRAMUSCULAR | Status: AC
  Administered 2022-08-19: 20 mL via INTRADERMAL

## 2022-08-19 NOTE — Discharge Instructions (Signed)
Do not scratch, rub, or pick at the adhesive. Leave tissue adhesive in place. It will come off naturally after 7-10 days. Do not place tape over the adhesive. The adhesive could come off the wound when you pull the tape off. Protect the wound from further injury until it is healed. Check your wound area every day for signs of infection. Check for: More redness, swelling, or pain,Fluid or blood,Warmth, Pus or a bad smell. Do not take baths, swim, or use a hot tub until your health care provider approves. You may only be allowed to take sponge baths. Ask your health care provider if you may take showers.You can usually shower after the first 24 hours. Cover the dressing with a watertight covering when you take a shower. Do not soak the area where there is tissue adhesive. Do not use any soaps, petroleum jelly products, or ointments on the wound. Certain ointments can weaken the adhesive. Contact a health care provider if: Your veins bleed above or under your skin. You have pain that gets worse. The area around a varicose vein becomes warm, red, or tender to the touch. You develop new sores or a rash near your varicose veins. You have a sore that does not heal, gets infected, or gets bigger. You have bad-smelling, yellowish fluid coming from a spot where there was external bleeding. You have a fever. Get help right away if: You have chest pain. You have trouble breathing. You have severe leg pain. Your legs and feet are turning blue or black. Your legs swell and harden. You have bleeding from your varicose veins that does not stop. You have dizziness or you have fainted.

## 2022-08-19 NOTE — ED Provider Notes (Signed)
[facility] Provider Note   CSN: [id number] Arrival date & time: 08/19/22  1646     History  Chief Complaint  Patient presents with   Leg Injury    Ruben Henry is a 77 y.o. male who presents emergency department chief complaint of bleeding from his left lower extremity.  Patient states that he was sitting and watching TV when he reached down to scratch something on his leg.  The next thing he knew was there was blood "squirting across the room out of his leg.  He is unsure if he has a varicosity in that region.  He does not take any blood thinners.  He denies any pain or known injury.  HPI     Home Medications Prior to Admission medications   Medication Sig Start Date End Date Taking? Authorizing Provider  albuterol (VENTOLIN HFA) 108 (90 Base) MCG/ACT inhaler USE 2 INHALATIONS BY MOUTH EVERY 6 HOURS AS NEEDED FOR WHEEZING  OR SHORTNESS OF BREATH 05/11/22   Saguier, Ramon Dredge, PA-C  aspirin 81 MG tablet Take 81 mg by mouth every other day.     [provider]  budesonide-formoterol (SYMBICORT) 160-4.5 MCG/ACT inhaler Inhale 2 puffs into the lungs 2 (two) times daily. Patient not taking: Reported on 08/13/2022 07/09/22   Omar Person, MD  budesonide-formoterol [facility]) 160-4.5 MCG/ACT inhaler Inhale 2 puffs into the lungs in the morning and at bedtime. 07/09/22   Omar Person, MD  ezetimibe (ZETIA) 10 MG tablet TAKE 1 TABLET BY MOUTH DAILY 08/13/22   Saguier, Ramon Dredge, PA-C  fluticasone [facility]) 50 MCG/ACT nasal spray Place 2 sprays into both nostrils daily. Patient taking differently: Place 2 sprays into both nostrils daily as needed. 12/20/20   Saguier, Ramon Dredge, PA-C  ipratropium (ATROVENT) 0.02 % nebulizer solution Take 0.5 mg by nebulization every 4 (four) hours as needed for wheezing or shortness of breath.    [provider]  levalbuterol [facility]) 0.63 MG/3ML nebulizer solution Take 0.63 mg by nebulization  every 4 (four) hours as needed for wheezing or shortness of breath.    [provider]  losartan (COZAAR) 25 MG tablet TAKE 1 TABLET BY MOUTH DAILY 08/13/22   Saguier, Ramon Dredge, PA-C  Omega-3 Fatty Acids (OMEGA 3 PO) Take 1 capsule by mouth daily.    [provider]  predniSONE (DELTASONE) 20 MG tablet Take 1 tablet (20 mg total) by mouth daily with breakfast. 07/30/22   Parrett, Virgel Bouquet, NP  senna (SENOKOT) 8.6 MG tablet Take 1 tablet by mouth every other day.    [provider]      Allergies    Patient has no known allergies.    Review of Systems   Review of Systems  Physical Exam Updated Vital Signs BP (!) 152/98   Pulse 97   Temp 97.7 F (36.5 C)   Resp 17   SpO2 97%  Physical Exam Vitals and nursing note reviewed.  Constitutional:      General: He is not in acute distress.    Appearance: He is well-developed. He is not diaphoretic.  HENT:     Head: Normocephalic and atraumatic.  Eyes:     General: No scleral icterus.    Conjunctiva/sclera: Conjunctivae normal.  Cardiovascular:     Rate and Rhythm: Normal rate and regular rhythm.     Heart sounds: Normal heart sounds.  Pulmonary:     Effort: Pulmonary effort is normal. No respiratory distress.  Breath sounds: Normal breath sounds.  Abdominal:     Palpations: Abdomen is soft.     Tenderness: There is no abdominal tenderness.  Musculoskeletal:     Cervical back: Normal range of motion and neck supple.     Comments: Left lower extremity with a punctate wound just over a large plexus of small varicosities.  There is a soaked bandage around the wound.  Bleeding is slowed now to a trickle.  Skin:    General: Skin is warm and dry.  Neurological:     Mental Status: He is alert.  Psychiatric:        Behavior: Behavior normal.     ED Results / Procedures / Treatments   Labs (all labs ordered are listed, but only abnormal results are displayed) Labs Reviewed - No data to  display  EKG None  Radiology No results found.  Procedures Wound closure utilizing adhes only  Date/Time: 08/19/2022 5:30 PM  Performed by: Arthor Captain, PA-C Authorized by: Arthor Captain, PA-C  Consent: Verbal consent obtained. Written consent obtained. Patient identity confirmed: verbally with patient Time out: Immediately prior to procedure a "time out" was called to verify the correct patient, procedure, equipment, support staff and site/side marked as required. Preparation: Patient was prepped and draped in the usual sterile fashion. Local anesthesia used: yes Anesthesia: local infiltration  Anesthesia: Local anesthesia used: yes Local Anesthetic: lidocaine 2% with epinephrine Anesthetic total: 3 mL Patient tolerance: patient tolerated the procedure well with no immediate complications Comments: Local infiltration of the wound using lidocaine with epi to achieve full hemostasis.  Tissue adhesive applied and allowed to fully dry.  Hemostasis achieved over leading varicosity.       Medications Ordered in ED Medications  lidocaine-EPINEPHrine (XYLOCAINE W/EPI) 2 %-1:200000 (PF) injection 20 mL (20 mLs Intradermal Given 08/19/22 1704)    ED Course/ Medical Decision Making/ A&P                             Medical Decision Making Risk Prescription drug management.   Patient here with bleeding from the left lower extremity.  Appears to have a small puncture in a varicose plexus.  Hemostasis achieved as described.  Wound care discussed.  All questions answered.  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

## 2022-08-19 NOTE — ED Triage Notes (Signed)
Patient presents to ED via POV from home. Patient reports he was watching TV when his left lower leg began bleeding. Dressing in place. No active bleeding at this time. Denies injury or trauma.

## 2022-09-23 DIAGNOSIS — M25811 Other specified joint disorders, right shoulder: Secondary | ICD-10-CM | POA: Diagnosis not present

## 2022-09-25 ENCOUNTER — Telehealth: Payer: Self-pay | Admitting: *Deleted

## 2022-09-25 NOTE — Telephone Encounter (Signed)
1st attempt to reach pt. Pt has appointment 11/26/22 with Dr. Swaziland. Will need sooner appt if pt is planning to have appt before upcoming appt day

## 2022-09-25 NOTE — Telephone Encounter (Signed)
   Pre-operative Risk Assessment    Patient Name: Ruben Henry  DOB: 07-27-1945 MRN: [id number]    PT LAST SEEN 01/14/2021 ; PT WILL NEED IN OFFICE APPT   Request for Surgical Clearance    Procedure:   RIGHT SHOULDER REVERSE ARTHROPLASTY  Date of Surgery:  Clearance TBD                                 Surgeon:  DR. Malon Kindle Surgeon's Group or Practice Name:  [facility] Phone number:  [phone] ATTN: KERRI MAZE Fax number:  [phone]   Type of Clearance Requested:   - Medical ; ASA    Type of Anesthesia:   CHOICE   Additional requests/questions:    Elpidio Anis   09/25/2022, 10:56 AM

## 2022-09-25 NOTE — Telephone Encounter (Signed)
   Name: Ruben Henry  DOB: [birth year]  MRN: [id number]  Primary Cardiologist: Peter Swaziland, MD  Chart reviewed as part of pre-operative protocol coverage. Because of [name]'s past medical history and time since last visit, he will require a follow-up in-office visit in order to better assess preoperative cardiovascular risk. Last seen 01/14/2021 (Has appt in October 2024)  Pre-op covering staff: - Please schedule appointment and call patient to inform them. If patient already had an upcoming appointment within acceptable timeframe, please add "pre-op clearance" to the appointment notes so provider is aware. - Please contact requesting surgeon's office via preferred method (i.e, phone, fax) to inform them of need for appointment prior to surgery.  This message will also be routed to pharmacy pool and/or Dr Swaziland for input on holding ASA as requested below so that this information is available to the clearing provider at time of patient's appointment.   Joni Reining, NP  09/25/2022, 2:35 PM

## 2022-09-28 NOTE — Telephone Encounter (Signed)
Patient schedule for 8/19 at 8:50am.

## 2022-09-29 NOTE — Progress Notes (Addendum)
Cardiology Office Note:    Date:  10/05/2022   ID:  Ruben Henry, DOB [birth year], MRN [id number]  PCP:  Esperanza Richters, PA-C  Cardiologist:  Peter Swaziland, MD  Electrophysiologist:  None   Referring MD: Esperanza Richters, PA-C   Chief Complaint: pre-op evaluation  History of Present Illness:    Ruben Henry is a 77 y.o. male with a history of CAD s/p remote angioplasty of RCA and stenting of the LCX in 1998, asthma,  hypertension, hyperlipidemia, hepatitis steatosis with elevated transaminases, prostate cancer, and blindness of right eye who is followed by Dr. Swaziland and presents today for pre-op evaluation.   Patient has a long history of CAD with remote angioplasty of the RCA and stenting of the LCX with an AVE stent in 1998. She has had several stress test since that time. Last Myoview in 08/2016 was low risk with no evidence of ischemia. He was having some problems with shortness of breath and a cough in 2021 which almost immediately improved after Lisinopril was stopped and he was started on Flovent.   He was last seen by me in 12/2020 at which time he continued to report some dyspnea on exertion that improved with use of his Flovent inhaler but was doing well from a cardiac standpoint overall. Dyspnea not felt to be cardiac in nature. He has since been diagnosed with moderate persistent asthma and is now followed by Pulmonology.  Patient presents today for pre-op evaluation for upcoming right shoulder reverse arthroplasty. He is doing very well from a cardiac standpoint. He denies any chest pain. No shortness of breath outside of asthma flares but his asthma has been well controlled on the higher dose of Symbicort. No orthopnea or PND. He does have some mild varicose veins but they do not bother him. No lower extremity edema. No palpitations, lightheadedness, dizziness, or syncope. No abnormal bleeding in urine or stools. He is easily able to complete >4.0 METs without any chest  pain or shortness of breath. He goes golfing 3 times a week without any issues.  EKGs/Labs/Other Studies Reviewed:    The following studies were reviewed:  Myoview 09/09/2016: The left ventricular ejection fraction is normal (55-65%). Nuclear stress EF: 57%. Blood pressure demonstrated a normal response to exercise. There was 1-63mm of upsloping ST segment depression in the inferolateral leads at peak exercise that resolved immediately in recovery There is a small defect of mild severity present in the basal inferior and apical inferior location. The defect is non-reversible and c/w diaphragmatic attenuation artifact. No ischemia noted. This is a low risk study   EKG:  EKG ordered today. EKG personally reviewed and demonstrates normal sinus rhythm, rate 74 bpm, with PACs and no acute ST/T changes. Normal axis. Normal PR and QRS intervals. QTc 461 ms.  EKG:  EKG ordered today.  EKG Interpretation Date/Time:  Monday October 05 2022 08:57:33 EDT Ventricular Rate:  69 PR Interval:  168 QRS Duration:  94 QT Interval:  400 QTC Calculation: 428 R Axis:   33  Text Interpretation: Normal sinus rhythm No acute changes Confirmed by Marjie Skiff [phone]) on 10/05/2022 9:20:55 AM    Recent Labs: 03/19/2022: B Natriuretic Peptide 34.7 09/30/2022: ALT 53; BUN 10; Creatinine, Ser 0.71; Hemoglobin 13.9; Platelets 215.0; Potassium 4.0; Sodium 134  Recent Lipid Panel    Component Value Date/Time   CHOL 217 (H) 01/31/2021 1108   TRIG 85 01/31/2021 1108   HDL 86 01/31/2021 1108   CHOLHDL 2.5 01/31/2021  1108   CHOLHDL 3 12/13/2018 0938   VLDL 24.2 12/13/2018 0938   LDLCALC 116 (H) 01/31/2021 1108   LDLDIRECT 106.6 03/24/2012 0801    Physical Exam:    Vital Signs: BP (!) 156/84   Pulse 71   Ht 5' 10.5" (1.791 m)   Wt 192 lb (87.1 kg)   SpO2 96%   BMI 27.16 kg/m     Wt Readings from Last 3 Encounters:  10/05/22 192 lb (87.1 kg)  09/30/22 188 lb (85.3 kg)  08/13/22 194 lb 12.8 oz (88.4  kg)     General: 77 y.o. Caucasian male in no acute distress. HEENT: Normocephalic and atraumatic. Sclera clear.  Neck: Supple. No carotid bruits. No JVD. Heart: RRR. Distinct S1 and S2. No murmurs, gallops, or rubs. Radial pulses 2+ and equal bilaterally. Lungs: No increased work of breathing. Clear to ausculation bilaterally. No wheezes, rhonchi, or rales.  Abdomen: Soft, non-distended, and non-tender to palpation. Extremities: No lower extremity edema. Varicose veins of bilateral lower extremities. Skin: Warm and dry. Neuro: Alert and oriented x3. No focal deficits. Psych: Normal affect. Responds appropriately.   Assessment:    1. Pre-op evaluation   2. Coronary artery disease involving native coronary artery of native heart without angina pectoris   3. Primary hypertension   4. Hyperlipidemia, unspecified hyperlipidemia type     Plan:    Pre-Op Evaluation Patient has upcoming right shoulder reverse arthroplasty planned. He is doing well from a cardiac standpoint. No angina, shortness of breath, acute CHF symptoms, palpitations, syncope. He is able to complete >4.0 METS of physical activity. Per Revised Cardiac Risk Index, considered Class II risk (30 day risk of death, MI, or cardiac arrest = 6%). Therefore, based on ACC/AHA guidelines, patient would be at acceptable risk for the planned procedure without further cardiovascular testing. Regarding Aspirin therapy, we recommend continuation of Aspirin throughout the perioperative period.  However, if the surgeon feels that cessation of Aspirin is required in the perioperative period, it may be stopped 5-7 days prior to surgery with a plan to resume it as soon as felt to be feasible from a surgical standpoint in the post-operative period. I will route this recommendation to the requesting party via Epic fax function.   CAD  History of remote PCI to RCA and LCX in 1998. Myoview in 2018 was low risk. - No angina. - Continue Aspirin 81mg   daily. - Intolerant to statins and red yeast rice due to elevated transaminases. Continue Zetia 10mg  daily.  Hypertension BP mildly elevated. Initially 140/72 and then 156/84 on my personal recheck at the end of visit. However, he states BP is usually in the 130s/70-80s at home. - Continue Losartan 25mg  daily. - Asked patient to keep a BP/HR log for 2 weeks and then send this to [place] via MyChart.   Hyperlipidemia  Most recent lipid panel in 01/2021: Total Cholesterol 217, Triglycerides 85, HDL 86, LDL 116. LDL goal <70 given CAD. - Intolerant to statins and red yeast rice due to elevated transaminases. He was previously prescribed Nexletol but was unable to afford this. Continue Zetia 10mg  daily and Omega-3. He has declined PCSK9 inhibitor. - Patient is not fasting today so he will come back for lipid panel and LFTs.   Disposition: Follow up in 1 year.    Medication Adjustments/Labs and Tests Ordered: Current medicines are reviewed at length with the patient today.  Concerns regarding medicines are outlined above.  Orders Placed This Encounter  Procedures  Lipid panel   Hepatic function panel   EKG 12-Lead   No orders of the defined types were placed in this encounter.   Patient Instructions  Medication Instructions:  The current medical regimen is effective;  continue present plan and medications as directed. Please refer to the Current Medication list given to you today.  *If you need a refill on your cardiac medications before your next appointment, please call your pharmacy*  Lab Work: FASTING LIPID AND LFT WITH 1 MONTH If you have labs (blood work) drawn today and your tests are completely normal, you will receive your results only by:  MyChart Message (if you have MyChart) OR  A paper copy in the mail If you have any lab test that is abnormal or we need to change your treatment, we will call you to review the results.  Testing/Procedures: NONE  Other Instructions OK FOR  UPCOMING SURGERY  Follow-Up: At [facility], you and your health needs are our priority.  As part of our continuing mission to provide you with exceptional heart care, we have created designated Provider Care Teams.  These Care Teams include your primary Cardiologist (physician) and Advanced Practice Providers (APPs -  Physician Assistants and Nurse Practitioners) who all work together to provide you with the care you need, when you need it.  Your next appointment:   12 month(s)  Provider:   Peter Swaziland, MD        Signed, Corrin Parker, PA-C  10/05/2022 9:46 AM    [facility]

## 2022-09-30 ENCOUNTER — Ambulatory Visit (INDEPENDENT_AMBULATORY_CARE_PROVIDER_SITE_OTHER): Payer: Medicare Other | Admitting: Medical

## 2022-09-30 VITALS — BP 136/78 | HR 64 | Temp 98.4°F | Resp 19 | Ht 70.0 in | Wt 188.0 lb

## 2022-09-30 DIAGNOSIS — R739 Hyperglycemia, unspecified: Secondary | ICD-10-CM

## 2022-09-30 DIAGNOSIS — Z01818 Encounter for other preprocedural examination: Secondary | ICD-10-CM | POA: Diagnosis not present

## 2022-09-30 DIAGNOSIS — J4520 Mild intermittent asthma, uncomplicated: Secondary | ICD-10-CM | POA: Diagnosis not present

## 2022-09-30 DIAGNOSIS — I10 Essential (primary) hypertension: Secondary | ICD-10-CM

## 2022-09-30 DIAGNOSIS — K219 Gastro-esophageal reflux disease without esophagitis: Secondary | ICD-10-CM

## 2022-09-30 NOTE — Progress Notes (Signed)
Subjective:    Patient ID: Ruben Henry, male    DOB: Jul 23, 1945, 77 y.o.   MRN: [id number]  HPI  Pt in for pre op clearance. Rt side rotator cuff surgery.  Pt update me he got dx of adult asthma by pulmonologist. Pt states pft indicated that. Pt states symbicort was given. Followed by pulmonologist. Recently given symbicort higher dose 160 2 inh twice a day. Stable for 3 months by chart review.   He states gerd may have been factor in some of flares. He is on pepcid and prilosec. Controled with combination.   Htn- on losartan 25 mg daily. No cardiac or neurologic signs or symptoms.  Pt seeing cardiologist on Monday.  No easy bruising. 6 months ago cbc normal.   Recent cxr 06-25-2022 normal.   Pt had prior surgery general anesthesia with no complications.     Review of Systems  Constitutional:  Negative for chills, fatigue and fever.  HENT:  Negative for congestion, postnasal drip, sinus pressure and sore throat.   Respiratory:  Negative for cough, chest tightness and stridor.   Cardiovascular:  Negative for chest pain and palpitations.  Gastrointestinal:  Negative for abdominal pain.  Genitourinary:  Negative for dysuria, flank pain and frequency.  Musculoskeletal:  Negative for back pain and myalgias.  Skin:  Negative for rash.  Neurological:  Negative for dizziness and light-headedness.  Hematological:  Negative for adenopathy. Does not bruise/bleed easily.  Psychiatric/Behavioral:  Negative for behavioral problems and dysphoric mood.    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   CAD (coronary artery disease) 07/20/2013   s/p remote PCI of RCA and LCX in 1998   Hypercholesterolemia    Hypertension    Osteoarthritis of right hip    Prostate cancer [facility])    brachytherapy     Social History   Socioeconomic History   Marital status: Married    Spouse name: Not  on file   Number of children: 2   Years of education: Not on file   Highest education level: Bachelor's degree (e.g., BA, AB, BS)  Occupational History   Occupation: Garment/textile technologist: [employer]  Tobacco Use   Smoking status: Never   Smokeless tobacco: Never  Vaping Use   Vaping status: Never Used  Substance and Sexual Activity   Alcohol use: Yes    Alcohol/week: 14.0 standard drinks of alcohol    Types: 14 Cans of beer per week    Comment: couple of beers a day. Coors Light.   Drug use: No   Sexual activity: Yes  Other Topics Concern   Not on file  Social History Narrative   Not on file   Social Determinants of Health   Financial Resource Strain: Low Risk  (09/30/2022)   Overall Financial Resource Strain (CARDIA)    Difficulty of Paying Living Expenses: Not hard at all  Food Insecurity: No Food Insecurity (09/30/2022)   Hunger Vital Sign    Worried About Running Out of Food in the Last Year: Never true    Ran Out of Food in the Last Year: Never true  Transportation Needs: No Transportation Needs (09/30/2022)   PRAPARE - Administrator, Civil Service (Medical): No    Lack of Transportation (Non-Medical): No  Physical Activity: Sufficiently Active (09/30/2022)   Exercise Vital Sign    Days of  Exercise per Week: 4 days    Minutes of Exercise per Session: 120 min  Stress: No Stress Concern Present (09/30/2022)   [employer] of Occupational Health - Occupational Stress Questionnaire    Feeling of Stress : Not at all  Social Connections: Unknown (09/30/2022)   Social Connection and Isolation Panel [NHANES]    Frequency of Communication with Friends and Family: Twice a week    Frequency of Social Gatherings with Friends and Family: Patient declined    Attends Religious Services: Patient declined    Database administrator or Organizations: Patient declined    Attends Banker Meetings: 1 to 4 times per year    Marital Status: Married   Catering manager Violence: Not At Risk (05/28/2022)   Humiliation, Afraid, Rape, and Kick questionnaire    Fear of Current or Ex-Partner: No    Emotionally Abused: No    Physically Abused: No    Sexually Abused: No    Past Surgical History:  Procedure Laterality Date   CORONARY ANGIOPLASTY WITH STENT PLACEMENT  06/21/1996   prior angioplasty of the RCA with stenting of the LCX in 1998   EYE SURGERY  1969   Right eye /    TONSILLECTOMY AND ADENOIDECTOMY      Family History  Problem Relation Age of Onset   Heart failure Mother    Esophageal cancer Father        died age 63, exp to asbestos   Cancer Father    Diabetes Sister    Allergies Sister    Heart attack Maternal Uncle 67   Coronary artery disease Maternal Uncle 7       with CABG    No Known Allergies  Current Outpatient Medications on File Prior to Visit  Medication Sig Dispense Refill   albuterol (VENTOLIN HFA) 108 (90 Base) MCG/ACT inhaler USE 2 INHALATIONS BY MOUTH EVERY 6 HOURS AS NEEDED FOR WHEEZING  OR SHORTNESS OF BREATH 34 g 2   aspirin 81 MG tablet Take 81 mg by mouth every other day.      budesonide-formoterol (SYMBICORT) 160-4.5 MCG/ACT inhaler Inhale 2 puffs into the lungs 2 (two) times daily. 1 each 11   ezetimibe (ZETIA) 10 MG tablet TAKE 1 TABLET BY MOUTH DAILY 100 tablet 2   fluticasone (FLONASE) 50 MCG/ACT nasal spray Place 2 sprays into both nostrils daily. (Patient taking differently: Place 2 sprays into both nostrils daily as needed.) 16 g 2   ipratropium (ATROVENT) 0.02 % nebulizer solution Take 0.5 mg by nebulization every 4 (four) hours as needed for wheezing or shortness of breath.     levalbuterol (XOPENEX) 0.63 MG/3ML nebulizer solution Take 0.63 mg by nebulization every 4 (four) hours as needed for wheezing or shortness of breath.     losartan (COZAAR) 25 MG tablet TAKE 1 TABLET BY MOUTH DAILY 100 tablet 2   Omega-3 Fatty Acids (OMEGA 3 PO) Take 1 capsule by mouth daily.     senna (SENOKOT)  8.6 MG tablet Take 1 tablet by mouth every other day.     budesonide-formoterol (SYMBICORT) 160-4.5 MCG/ACT inhaler Inhale 2 puffs into the lungs in the morning and at bedtime. 30.6 g 3   predniSONE (DELTASONE) 20 MG tablet Take 1 tablet (20 mg total) by mouth daily with breakfast. 5 tablet 0   No current facility-administered medications on file prior to visit.    BP 136/78   Pulse 64   Temp 98.4 F (36.9 C) (Oral)  Resp 19   Ht 5\' 10"  (1.778 m)   Wt 188 lb (85.3 kg)   SpO2 96%   BMI 26.98 kg/m         Objective:   Physical Exam  General Mental Status- Alert. General Appearance- Not in acute distress.   Skin General: Color- Normal Color. Moisture- Normal Moisture.  Neck Carotid Arteries- Normal color. Moisture- Normal Moisture. No carotid bruits. No JVD.  Chest and Lung Exam Auscultation: Breath Sounds:-Normal.  Cardiovascular Auscultation:Rythm- Regular. Murmurs & Other Heart Sounds:Auscultation of the heart reveals- No Murmurs.  Abdomen Inspection:-Inspeection Normal. Palpation/Percussion:Note:No mass. Palpation and Percussion of the abdomen reveal- Non Tender, Non Distended + BS, no rebound or guarding.  Neurologic Cranial Nerve exam:- CN III-XII intact(No nystagmus), symmetric smile. Strength:- 5/5 equal and symmetric strength both upper and lower extremities.   Lower ext- calfs symmetric, no pedal edema, negative homans signs bilaterally     Assessment & Plan:   Patient Instructions  1. Pre-operative clearance Follow labs and then determine medical clearance. Upcoming cardiologist appt on Monday for cardiac evaluation/clearance - CBC w/Diff - Comp Met (CMET)  2. Hypertension, unspecified type Controlled. Continue losartan 25 mg daily. - CBC w/Diff - Comp Met (CMET)  3. Mild intermittent asthma without complication Controlled and lungs clear. CXR not indicated presently. Continue symbicort and albuterol. If signs and symptoms change before  surgery let us know and get cxr.  4. Gastroesophageal reflux disease without esophagitis Continue pepcid and omeprazole.(Controlled)   Follow up date to be determined after lab review      Esperanza Richters, PA-C

## 2022-09-30 NOTE — Patient Instructions (Addendum)
1. Pre-operative clearance Follow labs and then determine medical clearance. Upcoming cardiologist appt on Monday for cardiac evaluation/clearance - CBC w/Diff - Comp Met (CMET)  2. Hypertension, unspecified type Controlled. Continue losartan 25 mg daily. - CBC w/Diff - Comp Met (CMET)  3. Mild intermittent asthma without complication Controlled and lungs clear. CXR not indicated presently. Continue symbicort and albuterol. If signs and symptoms change before surgery let us know and get cxr.  4. Gastroesophageal reflux disease without esophagitis Continue pepcid and omeprazole.(Controlled)   Follow up date to be determined after lab review

## 2022-10-01 LAB — COMPREHENSIVE METABOLIC PANEL
ALT: 53 U/L (ref 0–53)
AST: 67 U/L — ABNORMAL HIGH (ref 0–37)
Albumin: 4.2 g/dL (ref 3.5–5.2)
Alkaline Phosphatase: 134 U/L — ABNORMAL HIGH (ref 39–117)
BUN: 10 mg/dL (ref 6–23)
CO2: 27 meq/L (ref 19–32)
Calcium: 9.5 mg/dL (ref 8.4–10.5)
Chloride: 99 meq/L (ref 96–112)
Creatinine, Ser: 0.71 mg/dL (ref 0.40–1.50)
GFR: 89.41 mL/min (ref >60.00)
Glucose, Bld: 103 mg/dL — ABNORMAL HIGH (ref 70–99)
Potassium: 4 meq/L (ref 3.5–5.1)
Sodium: 134 meq/L — ABNORMAL LOW (ref 135–145)
Total Bilirubin: 1.7 mg/dL — ABNORMAL HIGH (ref 0.2–1.2)
Total Protein: 7.5 g/dL (ref 6.0–8.3)

## 2022-10-01 LAB — CBC WITH DIFFERENTIAL/PLATELET
Basophils Absolute: 0.1 10*3/uL (ref 0.0–0.1)
Basophils Relative: 0.9 % (ref 0.0–3.0)
Eosinophils Absolute: 0.7 10*3/uL (ref 0.0–0.7)
Eosinophils Relative: 10.7 % — ABNORMAL HIGH (ref 0.0–5.0)
HCT: 42.2 % (ref 39.0–52.0)
Hemoglobin: 13.9 g/dL (ref 13.0–17.0)
Lymphocytes Relative: 17.2 % (ref 12.0–46.0)
Lymphs Abs: 1.1 10*3/uL (ref 0.7–4.0)
MCHC: 32.9 g/dL (ref 30.0–36.0)
MCV: 103.9 fl — ABNORMAL HIGH (ref 78.0–100.0)
Monocytes Absolute: 1 10*3/uL (ref 0.1–1.0)
Monocytes Relative: 16.1 % — ABNORMAL HIGH (ref 3.0–12.0)
Neutro Abs: 3.5 10*3/uL (ref 1.4–7.7)
Neutrophils Relative %: 55.1 % (ref 43.0–77.0)
Platelets: 215 10*3/uL (ref 150.0–400.0)
RBC: 4.07 Mil/uL — ABNORMAL LOW (ref 4.22–5.81)
RDW: 15.2 % (ref 11.5–15.5)
WBC: 6.3 10*3/uL (ref 4.0–10.5)

## 2022-10-01 LAB — HEMOGLOBIN A1C: Hgb A1c MFr Bld: 5.7 % (ref 4.6–6.5)

## 2022-10-02 ENCOUNTER — Telehealth: Payer: Self-pay | Admitting: Medical

## 2022-10-02 NOTE — Telephone Encounter (Signed)
Pt medical clearance form on your desk. Will you go ahead and fax it.

## 2022-10-05 ENCOUNTER — Ambulatory Visit: Payer: Medicare Other | Admitting: Student

## 2022-10-05 ENCOUNTER — Encounter: Payer: Self-pay | Admitting: *Deleted

## 2022-10-05 ENCOUNTER — Encounter: Payer: Self-pay | Admitting: Student

## 2022-10-05 VITALS — BP 156/84 | HR 71 | Ht 70.5 in | Wt 192.0 lb

## 2022-10-05 DIAGNOSIS — I2510 Atherosclerotic heart disease of native coronary artery without angina pectoris: Secondary | ICD-10-CM

## 2022-10-05 DIAGNOSIS — I10 Essential (primary) hypertension: Secondary | ICD-10-CM | POA: Diagnosis not present

## 2022-10-05 DIAGNOSIS — E785 Hyperlipidemia, unspecified: Secondary | ICD-10-CM

## 2022-10-05 DIAGNOSIS — Z01818 Encounter for other preprocedural examination: Secondary | ICD-10-CM | POA: Diagnosis not present

## 2022-10-05 NOTE — Telephone Encounter (Signed)
Clearance faxed (w/ ov notes, labs)

## 2022-10-05 NOTE — Patient Instructions (Signed)
Medication Instructions:  The current medical regimen is effective;  continue present plan and medications as directed. Please refer to the Current Medication list given to you today.  *If you need a refill on your cardiac medications before your next appointment, please call your pharmacy*  Lab Work: FASTING LIPID AND LFT WITH 1 MONTH If you have labs (blood work) drawn today and your tests are completely normal, you will receive your results only by:  MyChart Message (if you have MyChart) OR  A paper copy in the mail If you have any lab test that is abnormal or we need to change your treatment, we will call you to review the results.  Testing/Procedures: NONE  Other Instructions OK FOR UPCOMING SURGERY  Follow-Up: At [facility], you and your health needs are our priority.  As part of our continuing mission to provide you with exceptional heart care, we have created designated Provider Care Teams.  These Care Teams include your primary Cardiologist (physician) and Advanced Practice Providers (APPs -  Physician Assistants and Nurse Practitioners) who all work together to provide you with the care you need, when you need it.  Your next appointment:   12 month(s)  Provider:   Peter Swaziland, MD

## 2022-10-05 NOTE — Addendum Note (Signed)
Addended by: Marjie Skiff on: 10/05/2022 09:47 AM   Modules accepted: Orders

## 2022-10-13 ENCOUNTER — Ambulatory Visit: Payer: Medicare Other | Admitting: Internal Medicine

## 2022-10-14 ENCOUNTER — Encounter: Payer: Self-pay | Admitting: Internal Medicine

## 2022-10-14 ENCOUNTER — Ambulatory Visit: Payer: Medicare Other | Admitting: Internal Medicine

## 2022-10-14 VITALS — BP 130/70 | HR 80 | Ht 70.5 in | Wt 191.8 lb

## 2022-10-14 DIAGNOSIS — D7219 Other eosinophilia: Secondary | ICD-10-CM

## 2022-10-14 DIAGNOSIS — J4540 Moderate persistent asthma, uncomplicated: Secondary | ICD-10-CM | POA: Diagnosis not present

## 2022-10-14 DIAGNOSIS — E785 Hyperlipidemia, unspecified: Secondary | ICD-10-CM | POA: Diagnosis not present

## 2022-10-14 DIAGNOSIS — I2510 Atherosclerotic heart disease of native coronary artery without angina pectoris: Secondary | ICD-10-CM | POA: Diagnosis not present

## 2022-10-14 DIAGNOSIS — J309 Allergic rhinitis, unspecified: Secondary | ICD-10-CM

## 2022-10-14 DIAGNOSIS — Z01818 Encounter for other preprocedural examination: Secondary | ICD-10-CM | POA: Diagnosis not present

## 2022-10-14 DIAGNOSIS — I10 Essential (primary) hypertension: Secondary | ICD-10-CM | POA: Diagnosis not present

## 2022-10-14 MED ORDER — AZELASTINE HCL 0.1 % NA SOLN: 1.0000 | Freq: Two times a day (BID) | NASAL | 5 refills | Status: DC

## 2022-10-14 NOTE — Progress Notes (Signed)
Ruben Henry    [id number]    1945-08-12  Primary Care Physician:Saguier, Kateri Mc Date of Appointment: 10/14/2022 Established Patient Visit  Chief complaint:   Chief Complaint  Patient presents with   Follow-up    Pt states his cough has gotten better, pt using Symbicort. He does has concerns of congestion       HPI: Ruben Henry is a 77 y.o. man, never smoker, with history of CAD s/p PCI in 1998 and prostate cancer. 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 Diagnosed with asthma in adulthood around 2020.   Interval Updates: Patient of dr Thora Lance. Seeing me for the first time today to transition care.  Overall feeling well since increasing symbicort dosage. However, nasal congestion is his biggest ongoing concern.  Wakes up with nasal congestion, sneezing. Worse in warmer weather than cold weather.   Not taking a regular antihistamine, or flonase  Current Regimen:symbicort 160 2 puffs twice daily Asthma Triggers: seasonal allergies.  Exacerbations in the last year: History of hospitalization or intubation: Allergy Testing: never had.  GERD: yes on prilosec and pepcid. imp Allergic Rhinitis: yes ACT:  Asthma Control Test ACT Total Score  10/14/2022  3:38 PM 24   FeNO:    I have reviewed the patient's family social and past medical history and updated as appropriate.   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   CAD (coronary artery disease) 07/20/2013   s/p remote PCI of RCA and LCX in 1998   Hypercholesterolemia    Hypertension    Osteoarthritis of right hip    Prostate cancer [facility])    brachytherapy    Past Surgical History:  Procedure Laterality Date   CORONARY ANGIOPLASTY WITH STENT PLACEMENT  06/21/1996   prior angioplasty of the RCA with stenting of the LCX in 1998   EYE SURGERY  1969   Right eye /     TONSILLECTOMY AND ADENOIDECTOMY      Family History  Problem Relation Age of Onset   Heart failure Mother    Esophageal cancer Father        died age 79, exp to asbestos   Cancer Father    Diabetes Sister    Allergies Sister    Heart attack Maternal Uncle 24   Coronary artery disease Maternal Uncle 53       with CABG    Social History   Occupational History   Occupation: Garment/textile technologist: [employer]  Tobacco Use   Smoking status: Never   Smokeless tobacco: Never  Vaping Use   Vaping status: Never Used  Substance and Sexual Activity   Alcohol use: Yes    Alcohol/week: 14.0 standard drinks of alcohol    Types: 14 Cans of beer per week    Comment: couple of beers a day. Coors Light.   Drug use: No   Sexual activity: Yes     Physical Exam: Blood pressure 130/70, pulse 80, height 5' 10.5" (1.791 m), weight 191 lb 12.8 oz (87 kg), SpO2 96%.  Gen:      No acute distress ENT:  no nasal polyps, mucus membranes moist Lungs:    No increased respiratory effort, symmetric chest wall excursion, clear to auscultation bilaterally, no wheezes or crackles CV:         Regular rate and  rhythm; no murmurs, rubs, or gallops.  No pedal edema   Data Reviewed: Imaging: I have personally reviewed the Chest xray May 2024  PFTs:     Latest Ref Rng & Units 05/27/2021    1:55 PM  PFT Results  FVC-Pre L 3.22   FVC-Predicted Pre % 75   FVC-Post L 3.52   FVC-Predicted Post % 82   Pre FEV1/FVC % % 64   Post FEV1/FCV % % 64   FEV1-Pre L 2.06   FEV1-Predicted Pre % 66   FEV1-Post L 2.25   DLCO uncorrected ml/min/mmHg 28.64   DLCO UNC% % 113   DLCO corrected ml/min/mmHg 28.64   DLCO COR %Predicted % 113   DLVA Predicted % 127   TLC L 7.36   TLC % Predicted % 104   RV % Predicted % 145    I have personally reviewed the patient's PFTs and moderate airflow limitation FEV1 66% of predicted.   Labs: Lab Results  Component Value Date   WBC 6.3 09/30/2022   HGB 13.9  09/30/2022   HCT 42.2 09/30/2022   MCV 103.9 (H) 09/30/2022   PLT 215.0 09/30/2022   Lab Results  Component Value Date   NA 134 (L) 09/30/2022   K 4.0 09/30/2022   CL 99 09/30/2022   CO2 27 09/30/2022   Absolute eosinophil count 700 in August 2024  Immunization status: Immunization History  Administered Date(s) Administered   Influenza, High Dose Seasonal PF 11/01/2015, 01/30/2017, 01/06/2018, 11/15/2018   Influenza-Unspecified 01/30/2017, 11/15/2018, 01/01/2020, 01/16/2021, 01/02/2022   Moderna Sars-Covid-2 Vaccination 04/10/2019, 03/18/2020   Pneumococcal Conjugate-13 06/08/2013   Pneumococcal Polysaccharide-23 01/27/2012   Td 02/16/2006   Tdap 05/04/2016   Zoster Recombinant(Shingrix) 02/13/2019, 04/14/2019   Zoster, Live 12/09/2009    External Records Personally Reviewed: pulmonary  Assessment:  Moderate persistent eosinophilic asthma, controlled Chronic allergic rhinitis  Plan/Recommendations:  Continue symbicort 160 2 puffs twice daily with albuterol as needed Let's try astelin nasal spray to help with your nasal congestion. You can also add an anti-histamine like zyrtec, xyzal, claritin which are over the counter.  Will obtain allergy testing with region 2 allergy panel.   Return to Care: Return in about 6 months (around 04/16/2023).   Durel Salts, MD Pulmonary and Critical Care Medicine [facility] Office:[phone]

## 2022-10-14 NOTE — Patient Instructions (Addendum)
Please schedule follow up scheduled with myself in 6 months.  If my schedule is not open yet, we will contact you with a reminder closer to that time. Please call [phone] if you haven't heard from [place] a month before.   Let's try astelin nasal spray to help with your nasal congestion. You can also add an anti-histamine like zyrtec, xyzal, claritin which are over the counter.   Astelin - 1 spray on each side of your nose twice a day for first week, then 1 spray on each side.   Instructions for use: If you also use a saline nasal spray or rinse, use that first. Position the head with the chin slightly tucked. Use the right hand to spray into the left nostril and the right hand to spray into the left nostril.   Point the bottle away from the septum of your nose (cartilage that divides the two sides of your nose).  Hold the nostril closed on the opposite side from where you will spray Spray once and gently sniff to pull the medicine into the higher parts of your nose.  Don't sniff too hard as the medicine will drain down the back of your throat instead. Repeat with a second spray on the same side if prescribed. Repeat on the other side of your nose.  By learning about asthma and how it can be controlled, you take an important step toward managing this disease. Work closely with your asthma care team to learn all you can about your asthma, how to avoid triggers, what your medications do, and how to take them correctly. With proper care, you can live free of asthma symptoms and maintain a normal, healthy lifestyle.   What is asthma? Asthma is a chronic disease that affects the airways of the lungs. During normal breathing, the bands of muscle that surround the airways are relaxed and air moves freely. During an asthma episode or "attack," there are three main changes that stop air from moving easily through the airways: The bands of muscle that surround the airways tighten and make the airways  narrow. This tightening is called bronchospasm.  The lining of the airways becomes swollen or inflamed.  The cells that line the airways produce more mucus, which is thicker than normal and clogs the airways.  These three factors - bronchospasm, inflammation, and mucus production - cause symptoms such as difficulty breathing, wheezing, and coughing.  What are the most common symptoms of asthma? Asthma symptoms are not the same for everyone. They can even change from episode to episode in the same person. Also, you may have only one symptom of asthma, such as cough, but another person may have all the symptoms of asthma. It is important to know all the symptoms of asthma and to be aware that your asthma can present in any of these ways at any time. The most common symptoms include: Coughing, especially at night  Shortness of breath  Wheezing  Chest tightness, pain, or pressure   Who is affected by asthma? Asthma affects 22 million Americans; about 6 million of these are children under age 16. People who have a family history of asthma have an increased risk of developing the disease. Asthma is also more common in people who have allergies or who are exposed to tobacco smoke. However, anyone can develop asthma at any time. Some people may have asthma all of their lives, while others may develop it as adults.  What causes asthma? The airways in  a person with asthma are very sensitive and react to many things, or "triggers." Contact with these triggers causes asthma symptoms. One of the most important parts of asthma control is to identify your triggers and then avoid them when possible. The only trigger you do not want to avoid is exercise. Pre-treatment with medicines before exercise can allow you to stay active yet avoid asthma symptoms. Common asthma triggers include: Infections (colds, viruses, flu, sinus infections)  Exercise  Weather (changes in temperature and/or humidity, cold air)   Tobacco smoke  Allergens (dust mites, pollens, pets, mold spores, cockroaches, and sometimes foods)  Irritants (strong odors from cleaning products, perfume, wood smoke, air pollution)  Strong emotions such as crying or laughing hard  Some medications   How is asthma diagnosed? To diagnose asthma, your doctor will first review your medical history, family history, and symptoms. Your doctor will want to know any past history of breathing problems you may have had, as well as a family history of asthma, allergies, eczema (a bumpy, itchy skin rash caused by allergies), or other lung disease. It is important that you describe your symptoms in detail (cough, wheeze, shortness of breath, chest tightness), including when and how often they occur. The doctor will perform a physical examination and listen to your heart and lungs. He or she may also order breathing tests, allergy tests, blood tests, and chest and sinus X-rays. The tests will find out if you do have asthma and if there are any other conditions that are contributing factors.  How is asthma treated? Asthma can be controlled, but not cured. It is not normal to have frequent symptoms, trouble sleeping, or trouble completing tasks. Appropriate asthma care will prevent symptoms and visits to the emergency room and hospital. Asthma medicines are one of the mainstays of asthma treatment. The drugs used to treat asthma are explained below.  Anti-inflammatories: These are the most important drugs for most people with asthma. Anti-inflammatory drugs reduce swelling and mucus production in the airways. As a result, airways are less sensitive and less likely to react to triggers. These medications need to be taken daily and may need to be taken for several weeks before they begin to control asthma. Anti-inflammatory medicines lead to fewer symptoms, better airflow, less sensitive airways, less airway damage, and fewer asthma attacks. If taken every day, they  CONTROL or prevent asthma symptoms.   Bronchodilators: These drugs relax the muscle bands that tighten around the airways. This action opens the airways, letting more air in and out of the lungs and improving breathing. Bronchodilators also help clear mucus from the lungs. As the airways open, the mucus moves more freely and can be coughed out more easily. In short-acting forms, bronchodilators RELIEVE or stop asthma symptoms by quickly opening the airways and are very helpful during an asthma episode. In long-acting forms, bronchodilators provide CONTROL of asthma symptoms and prevent asthma episodes.  Asthma drugs can be taken in a variety of ways. Inhaling the medications by using a metered dose inhaler, dry powder inhaler, or nebulizer is one way of taking asthma medicines. Oral medicines (pills or liquids you swallow) may also be prescribed.  Asthma severity Asthma is classified as either "intermittent" (comes and goes) or "persistent" (lasting). Persistent asthma is further described as being mild, moderate, or severe. The severity of asthma is based on how often you have symptoms both during the day and night, as well as by the results of lung function tests and by how  well you can perform activities. The "severity" of asthma refers to how "intense" or "strong" your asthma is.  Asthma control Asthma control is the goal of asthma treatment. Regardless of your asthma severity, it may or may not be controlled. Asthma control means: You are able to do everything you want to do at work and home  You have no (or minimal) asthma symptoms  You do not wake up from your sleep or earlier than usual in the morning due to asthma  You rarely need to use your reliever medicine (inhaler)  Another major part of your treatment is that you are happy with your asthma care and believe your asthma is controlled.  Monitoring symptoms A key part of treatment is keeping track of how well your lungs are working.  Monitoring your symptoms, what they are, how and when they happen, and how severe they are, is an important part of being able to control your asthma.  Sometimes asthma is monitored using a peak flow meter. A peak flow (PF) meter measures how fast the air comes out of your lungs. It can help you know when your asthma is getting worse, sometimes even before you have symptoms. By taking daily peak flow readings, you can learn when to adjust medications to keep asthma under good control. It is also used to create your asthma action plan (see below). Your doctor can use your peak flow readings to adjust your treatment plan in some cases.  Asthma Action Plan Based on your history and asthma severity, you and your doctor will develop a care plan called an "asthma action plan." The asthma action plan describes when and how to use your medicines, actions to take when asthma worsens, and when to seek emergency care. Make sure you understand this plan. If you do not, ask your asthma care provider any questions you may have. Your asthma action plan is one of the keys to controlling asthma. Keep it readily available to remind you of what you need to do every day to control asthma and what you need to do when symptoms occur.  Goals of asthma therapy These are the goals of asthma treatment: Live an active, normal life  Prevent chronic and troublesome symptoms  Attend work or school every day  Perform daily activities without difficulty  Stop urgent visits to the doctor, emergency department, or hospital  Use and adjust medications to control asthma with few or no side effects

## 2022-10-15 LAB — HEPATIC FUNCTION PANEL
ALT: 49 IU/L — ABNORMAL HIGH (ref 0–44)
AST: 66 IU/L — ABNORMAL HIGH (ref 0–40)
Albumin: 4.6 g/dL (ref 3.8–4.8)
Alkaline Phosphatase: 157 IU/L — ABNORMAL HIGH (ref 44–121)
Bilirubin Total: 1.3 mg/dL — ABNORMAL HIGH (ref 0.0–1.2)
Bilirubin, Direct: 0.53 mg/dL — ABNORMAL HIGH (ref 0.00–0.40)
Total Protein: 7.8 g/dL (ref 6.0–8.5)

## 2022-10-15 LAB — LIPID PANEL
Chol/HDL Ratio: 2.2 ratio (ref 0.0–5.0)
Cholesterol, Total: 257 mg/dL — ABNORMAL HIGH (ref 100–199)
HDL: 117 mg/dL (ref >39)
LDL Chol Calc (NIH): 130 mg/dL — ABNORMAL HIGH (ref 0–99)
Triglycerides: 60 mg/dL (ref 0–149)
VLDL Cholesterol Cal: 10 mg/dL (ref 5–40)

## 2022-11-03 ENCOUNTER — Encounter: Payer: Self-pay | Admitting: Internal Medicine

## 2022-11-05 ENCOUNTER — Other Ambulatory Visit: Payer: Medicare Other

## 2022-11-05 DIAGNOSIS — J309 Allergic rhinitis, unspecified: Secondary | ICD-10-CM

## 2022-11-05 DIAGNOSIS — L821 Other seborrheic keratosis: Secondary | ICD-10-CM | POA: Diagnosis not present

## 2022-11-05 DIAGNOSIS — C44212 Basal cell carcinoma of skin of right ear and external auricular canal: Secondary | ICD-10-CM | POA: Diagnosis not present

## 2022-11-05 DIAGNOSIS — J4540 Moderate persistent asthma, uncomplicated: Secondary | ICD-10-CM | POA: Diagnosis not present

## 2022-11-05 DIAGNOSIS — D485 Neoplasm of uncertain behavior of skin: Secondary | ICD-10-CM | POA: Diagnosis not present

## 2022-11-05 DIAGNOSIS — C44529 Squamous cell carcinoma of skin of other part of trunk: Secondary | ICD-10-CM | POA: Diagnosis not present

## 2022-11-06 LAB — RESPIRATORY ALLERGY PROFILE REGION II ~~LOC~~
Allergen, A. alternata, m6: <0.1 kU/L
Allergen, Cedar tree, t12: 0.11 kU/L — ABNORMAL HIGH
Allergen, Comm Silver Birch, t9: <0.1 kU/L
Allergen, Cottonwood, t14: <0.1 kU/L
Allergen, D pternoyssinus,d7: <0.1 kU/L
Allergen, Mouse Urine Protein, e78: <0.1 kU/L
Allergen, Mulberry, t76: <0.1 kU/L
Allergen, Oak,t7: <0.1 kU/L
Allergen, P. notatum, m1: 0.16 kU/L — ABNORMAL HIGH
Aspergillus fumigatus, m3: 0.72 kU/L — ABNORMAL HIGH
Bermuda Grass: 0.12 kU/L — ABNORMAL HIGH
Box Elder IgE: <0.1 kU/L
CLADOSPORIUM HERBARUM (M2) IGE: <0.1 kU/L
COMMON RAGWEED (SHORT) (W1) IGE: 0.14 kU/L — ABNORMAL HIGH
Cat Dander: <0.1 kU/L
Class: 0
Class: 0
Class: 0
Class: 0
Class: 0
Class: 0
Class: 0
Class: 0
Class: 0
Class: 0
Class: 0
Class: 0
Class: 0
Class: 0
Class: 0
Class: 0
Class: 0
Class: 2
Cockroach: 0.1 kU/L — ABNORMAL HIGH
D. farinae: <0.1 kU/L
Dog Dander: <0.1 kU/L
Elm IgE: <0.1 kU/L
IgE (Immunoglobulin E), Serum: 483 kU/L — ABNORMAL HIGH (ref <=114)
Johnson Grass: 0.11 kU/L — ABNORMAL HIGH
Pecan/Hickory Tree IgE: <0.1 kU/L
Rough Pigweed  IgE: <0.1 kU/L
Sheep Sorrel IgE: <0.1 kU/L
Timothy Grass: <0.1 kU/L

## 2022-11-06 LAB — INTERPRETATION:

## 2022-11-16 DIAGNOSIS — C44529 Squamous cell carcinoma of skin of other part of trunk: Secondary | ICD-10-CM | POA: Diagnosis not present

## 2022-11-26 ENCOUNTER — Ambulatory Visit: Payer: Medicare Other | Admitting: Cardiology

## 2022-11-26 DIAGNOSIS — L905 Scar conditions and fibrosis of skin: Secondary | ICD-10-CM | POA: Diagnosis not present

## 2022-11-30 DIAGNOSIS — G8918 Other acute postprocedural pain: Secondary | ICD-10-CM | POA: Diagnosis not present

## 2022-11-30 DIAGNOSIS — M19011 Primary osteoarthritis, right shoulder: Secondary | ICD-10-CM | POA: Diagnosis not present

## 2022-12-15 DIAGNOSIS — Z4789 Encounter for other orthopedic aftercare: Secondary | ICD-10-CM | POA: Diagnosis not present

## 2023-01-12 DIAGNOSIS — Z4789 Encounter for other orthopedic aftercare: Secondary | ICD-10-CM | POA: Diagnosis not present

## 2023-01-20 ENCOUNTER — Encounter: Payer: Self-pay | Admitting: Medical

## 2023-01-20 ENCOUNTER — Ambulatory Visit (INDEPENDENT_AMBULATORY_CARE_PROVIDER_SITE_OTHER): Payer: Medicare Other | Admitting: Medical

## 2023-01-20 ENCOUNTER — Ambulatory Visit (HOSPITAL_BASED_OUTPATIENT_CLINIC_OR_DEPARTMENT_OTHER)
Admission: RE | Admit: 2023-01-20 | Discharge: 2023-01-20 | Disposition: A | Discharge status: Home / Self Care | Payer: Medicare Other | Source: Ambulatory Visit | Attending: Medical | Admitting: Medical

## 2023-01-20 VITALS — BP 135/70 | HR 88 | Temp 98.0°F | Resp 18 | Ht 70.0 in | Wt 199.0 lb

## 2023-01-20 DIAGNOSIS — J40 Bronchitis, not specified as acute or chronic: Secondary | ICD-10-CM | POA: Diagnosis not present

## 2023-01-20 DIAGNOSIS — R0602 Shortness of breath: Secondary | ICD-10-CM | POA: Diagnosis not present

## 2023-01-20 DIAGNOSIS — R058 Other specified cough: Secondary | ICD-10-CM | POA: Diagnosis not present

## 2023-01-20 DIAGNOSIS — D649 Anemia, unspecified: Secondary | ICD-10-CM

## 2023-01-20 DIAGNOSIS — R0600 Dyspnea, unspecified: Secondary | ICD-10-CM

## 2023-01-20 DIAGNOSIS — R059 Cough, unspecified: Secondary | ICD-10-CM

## 2023-01-20 DIAGNOSIS — R5383 Other fatigue: Secondary | ICD-10-CM | POA: Diagnosis not present

## 2023-01-20 DIAGNOSIS — Z862 Personal history of diseases of the blood and blood-forming organs and certain disorders involving the immune mechanism: Secondary | ICD-10-CM | POA: Diagnosis not present

## 2023-01-20 LAB — CBC WITH DIFFERENTIAL/PLATELET
Basophils Absolute: 0.1 10*3/uL (ref 0.0–0.1)
Basophils Relative: 0.7 % (ref 0.0–3.0)
Eosinophils Absolute: 0.6 10*3/uL (ref 0.0–0.7)
Eosinophils Relative: 5.8 % — ABNORMAL HIGH (ref 0.0–5.0)
HCT: 29.4 % — ABNORMAL LOW (ref 39.0–52.0)
Hemoglobin: 9.5 g/dL — ABNORMAL LOW (ref 13.0–17.0)
Lymphocytes Relative: 10.8 % — ABNORMAL LOW (ref 12.0–46.0)
Lymphs Abs: 1.1 10*3/uL (ref 0.7–4.0)
MCHC: 32.2 g/dL (ref 30.0–36.0)
MCV: 100.2 fL — ABNORMAL HIGH (ref 78.0–100.0)
Monocytes Absolute: 1.9 10*3/uL — ABNORMAL HIGH (ref 0.1–1.0)
Monocytes Relative: 18.6 % — ABNORMAL HIGH (ref 3.0–12.0)
Neutro Abs: 6.4 10*3/uL (ref 1.4–7.7)
Neutrophils Relative %: 64.1 % (ref 43.0–77.0)
Platelets: 317 10*3/uL (ref 150.0–400.0)
RBC: 2.94 Mil/uL — ABNORMAL LOW (ref 4.22–5.81)
RDW: 14.4 % (ref 11.5–15.5)
WBC: 10 10*3/uL (ref 4.0–10.5)

## 2023-01-20 MED ORDER — BENZONATATE 100 MG PO CAPS: 100.0000 mg | ORAL_CAPSULE | Freq: Three times a day (TID) | ORAL | 0 refills | Status: DC | PRN

## 2023-01-20 MED ORDER — METHYLPREDNISOLONE 4 MG PO TABS: ORAL_TABLET | ORAL | 0 refills | Status: DC

## 2023-01-20 MED ORDER — AZITHROMYCIN 250 MG PO TABS: ORAL_TABLET | ORAL | 0 refills | Status: AC

## 2023-01-20 NOTE — Addendum Note (Signed)
Addended by: Gwenevere Abbot on: 01/20/2023 08:06 PM   Modules accepted: Orders

## 2023-01-20 NOTE — Progress Notes (Signed)
Subjective:    Patient ID: Ruben Henry, male    DOB: 1945/06/05, 77 y.o.   MRN: [id number]  HPI  Discussed the use of AI scribe software for clinical note transcription with the patient, who gave verbal consent to proceed.  History of Present Illness   The patient, with a history of adult asthma and chronic rhinosinusitis/allergies, presents with a recent exacerbation of his chronic cough. Over the past week, the cough has worsened, characterized by the production of yellow mucus. The patient also reports a history of gastroesophageal reflux disease (GERD) and a previous diagnosis of an ulcer, which he suspects may be contributing to his current symptoms.  The patient's asthma has been relatively well-controlled, with three attacks in the past year requiring emergency room visits. He has not needed to use a nebulizer for some time. However, he has noticed increased fatigue and shortness of breath, particularly when climbing stairs. This shortness of breath resolves with rest, unlike during  prior severe asthma attack when a nebulizer is required.   later in interview state using neb treatment recently helped moderate amount.  The patient also reports  mild dark stools, but no blood or black stools.  Denies any current abdominal pain. He has a history of anemia related to a previous bleeding ulcer, which he suspects may be recurring due to his symptoms of fatigue and shortness of breath.  The patient is currently on Symbicort and Flonase for his asthma and rhinosinusitis, and takes an over-the-counter antihistamine as needed. He has not found significant relief from his symptoms with the use of nasal sprays.  The patient's symptoms are causing some disruption to his sleep due to the increased coughing and mucus production. He has also noticed wheezing over the past ten days. Despite these symptoms, the patient feels his asthma is generally under control.         Prior specialist note  last visit. 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 Can also use mucinex 600-1200mg  twice daily as needed and flutter valve 10 slow but firm puffs twice daily to get mucus out - if another exacerbation despite these measures then it may be time to consider a biologic for eosinophilic asthma and CRS   Review of Systems  Constitutional:  Positive for fatigue. Negative for chills.  HENT:  Positive for congestion.   Respiratory:  Positive for cough and shortness of breath. Negative for wheezing.   Cardiovascular:  Negative for chest pain and palpitations.  Gastrointestinal:  Negative for abdominal pain.  Genitourinary:  Negative for dysuria, frequency and hematuria.  Musculoskeletal:  Negative for back pain.  Neurological:  Negative for dizziness, syncope, weakness and light-headedness.  Hematological:  Negative for adenopathy. Does not bruise/bleed easily.  Psychiatric/Behavioral:  Negative for behavioral problems and confusion.    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   CAD (coronary artery disease) 07/20/2013   s/p remote PCI of RCA and LCX in 1998   Hypercholesterolemia    Hypertension    Osteoarthritis of right hip    Prostate cancer [facility])    brachytherapy     Social History   Socioeconomic History    Marital status: Married    Spouse name: Not on file   Number of children: 2   Years of education: Not on file   Highest education level: Bachelor's degree (e.g., BA, AB, BS)  Occupational History   Occupation: Garment/textile technologist: [employer]  Tobacco Use   Smoking status: Never   Smokeless tobacco: Never  Vaping Use   Vaping status: Never Used  Substance and Sexual Activity   Alcohol use: Yes    Alcohol/week: 14.0 standard drinks of alcohol    Types: 14 Cans of beer per week    Comment: couple of beers a day. [employer].   Drug use: No   Sexual activity: Yes  Other Topics Concern   Not on file  Social History Narrative   Not on file   Social Determinants of Health   Financial Resource Strain: Low Risk  (09/30/2022)   Overall Financial Resource Strain (CARDIA)    Difficulty of Paying Living Expenses: Not hard at all  Food Insecurity: No Food Insecurity (09/30/2022)   Hunger Vital Sign    Worried About Running Out of Food in the Last Year: Never true    Ran Out of Food in the Last Year: Never true  Transportation Needs: No Transportation Needs (09/30/2022)   PRAPARE - Administrator, Civil Service (Medical): No    Lack of Transportation (Non-Medical): No  Physical Activity: Sufficiently Active (09/30/2022)   Exercise Vital Sign    Days of Exercise per Week: 4 days    Minutes of Exercise per Session: 120 min  Stress: No Stress Concern Present (09/30/2022)   [employer] of Occupational Health - Occupational Stress Questionnaire    Feeling of Stress : Not at all  Social Connections: Unknown (09/30/2022)   Social Connection and Isolation Panel [NHANES]    Frequency of Communication with Friends and Family: Twice a week    Frequency of Social Gatherings with Friends and Family: Patient declined    Attends Religious Services: Patient declined    Database administrator or Organizations: Patient declined    Attends Banker Meetings: 1 to 4  times per year    Marital Status: Married  Catering manager Violence: Not At Risk (05/28/2022)   Humiliation, Afraid, Rape, and Kick questionnaire    Fear of Current or Ex-Partner: No    Emotionally Abused: No    Physically Abused: No    Sexually Abused: No    Past Surgical History:  Procedure Laterality Date   CORONARY ANGIOPLASTY WITH STENT PLACEMENT  06/21/1996   prior angioplasty of the RCA with stenting of the LCX in 1998   EYE SURGERY  1969   Right eye /    TONSILLECTOMY AND ADENOIDECTOMY      Family History  Problem Relation Age of Onset   Heart failure Mother    Esophageal cancer Father        died age 39, exp to asbestos   Cancer Father    Diabetes Sister    Allergies Sister    Heart attack Maternal Uncle 27   Coronary artery disease Maternal Uncle 71  with CABG    No Known Allergies  Current Outpatient Medications on File Prior to Visit  Medication Sig Dispense Refill   albuterol (VENTOLIN HFA) 108 (90 Base) MCG/ACT inhaler USE 2 INHALATIONS BY MOUTH EVERY 6 HOURS AS NEEDED FOR WHEEZING  OR SHORTNESS OF BREATH 34 g 2   aspirin 81 MG tablet Take 81 mg by mouth every other day.      azelastine (ASTELIN) 0.1 % nasal spray Place 1 spray into both nostrils 2 (two) times daily. Use in each nostril as directed 30 mL 5   budesonide-formoterol (SYMBICORT) 160-4.5 MCG/ACT inhaler Inhale 2 puffs into the lungs 2 (two) times daily. 1 each 11   ezetimibe (ZETIA) 10 MG tablet TAKE 1 TABLET BY MOUTH DAILY 100 tablet 2   fluticasone (FLONASE) 50 MCG/ACT nasal spray Place 2 sprays into both nostrils daily. (Patient taking differently: Place 2 sprays into both nostrils daily as needed.) 16 g 2   ipratropium (ATROVENT) 0.02 % nebulizer solution Take 0.5 mg by nebulization every 4 (four) hours as needed for wheezing or shortness of breath.     levalbuterol (XOPENEX) 0.63 MG/3ML nebulizer solution Take 0.63 mg by nebulization every 4 (four) hours as needed for wheezing or  shortness of breath.     losartan (COZAAR) 25 MG tablet TAKE 1 TABLET BY MOUTH DAILY 100 tablet 2   Omega-3 Fatty Acids (OMEGA 3 PO) Take 1 capsule by mouth daily.     senna (SENOKOT) 8.6 MG tablet Take 1 tablet by mouth every other day.     No current facility-administered medications on file prior to visit.    BP 135/70   Pulse 88   Temp 98 F (36.7 C)   Resp 18   Ht 5\' 10"  (1.778 m)   Wt 199 lb (90.3 kg)   SpO2 97%   BMI 28.55 kg/m           Objective:   Physical Exam  General Mental Status- Alert. General Appearance- Not in acute distress.   Skin General: Color- Normal Color. Moisture- Normal Moisture.  Neck Carotid Arteries- Normal color. Moisture- Normal Moisture. No carotid bruits. No JVD.  Chest and Lung Exam Auscultation: Breath Sounds:-Normal. CHEST: Rough breath sounds on both lung fields. Even and unlabored   Cardiovascular Auscultation:Rythm- Regular. Murmurs & Other Heart Sounds:Auscultation of the heart reveals- No Murmurs.  Abdomen Inspection:-Inspeection Normal. Palpation/Percussion:Note:No mass. Palpation and Percussion of the abdomen reveal- Non Tender, Non Distended + BS, no rebound or guarding.   Neurologic Cranial Nerve exam:- CN III-XII intact(No nystagmus), symmetric smile. Strength:- 5/5 equal and symmetric strength both upper and lower extremities.   Calfs symmetric- not swollen. No pedal edema. Negative homans signs.    Assessment & Plan:   Assessment and Plan    Acute Bronchitis Increased cough with yellow sputum and wheezing for the past week. Rough breath sounds on auscultation. -Order chest X-ray and CBC to rule out pneumonia and anemia. -Prescribe Azithromycin, Benzonatate, and a 6-day Medrol taper. -Continue Symbicort, Flonase, and Zyrtec.  Asthma Chronic condition, currently under control with Symbicort. -Continue Symbicort 2 puffs twice daily.  Chronic Rhinosinusitis History of postnasal drainage contributing to  cough. -Continue Flonase daily for 7-10 days and Zyrtec as needed.  Gastrointestinal Bleed (remote) Dark stools reported, history of ulcer. Fatigue and shortness of breath reported, which may be due to anemia from a GI bleed. -If CBC shows a drop in hemoglobin/hematocrit, order stool test to check for blood. -If reflux symptoms or stomach  pain develop, patient to notify the office.   Follow up in 10 days or sooner if needed        [employer], PA-C

## 2023-01-20 NOTE — Patient Instructions (Signed)
Acute Bronchitis Increased cough with yellow sputum and wheezing for the past week. Rough breath sounds on auscultation. -Order chest X-ray and CBC to rule out pneumonia and anemia. -Prescribe Azithromycin, Benzonatate, and a 6-day Medrol taper. -Continue Symbicort, Flonase, and Zyrtec.  Asthma Chronic condition, currently under control with Symbicort. -Continue Symbicort 2 puffs twice daily.  Chronic Rhinosinusitis History of postnasal drainage contributing to cough. -Continue Flonase daily for 7-10 days and Zyrtec as needed.  Gastrointestinal Bleed (remote) Dark stools reported, history of ulcer. Fatigue and shortness of breath reported, which may be due to anemia from a GI bleed. -If CBC shows a drop in hemoglobin/hematocrit, order stool test to check for blood. -If reflux symptoms or stomach pain develop, patient to notify the office.   Follow up in 10 days or sooner if needed

## 2023-01-21 LAB — IRON,TIBC AND FERRITIN PANEL
%SAT: 6 % — ABNORMAL LOW (ref 20–48)
Ferritin: 23 ng/mL — ABNORMAL LOW (ref 24–380)
Iron: 34 ug/dL — ABNORMAL LOW (ref 50–180)
TIBC: 555 ug/dL — ABNORMAL HIGH (ref 250–425)

## 2023-01-21 MED ORDER — IRON (FERROUS SULFATE) 325 (65 FE) MG PO TABS: ORAL_TABLET | ORAL | 1 refills | Status: DC

## 2023-01-21 NOTE — Addendum Note (Signed)
Addended by: Gwenevere Abbot on: 01/21/2023 08:53 PM   Modules accepted: Orders

## 2023-01-22 ENCOUNTER — Other Ambulatory Visit: Payer: Self-pay | Admitting: Medical

## 2023-01-22 ENCOUNTER — Encounter: Payer: Self-pay | Admitting: Physical Therapy

## 2023-01-22 ENCOUNTER — Ambulatory Visit: Payer: Medicare Other | Attending: Orthopedic Surgery | Admitting: Physical Therapy

## 2023-01-22 ENCOUNTER — Other Ambulatory Visit (INDEPENDENT_AMBULATORY_CARE_PROVIDER_SITE_OTHER): Payer: Medicare Other

## 2023-01-22 DIAGNOSIS — M25511 Pain in right shoulder: Secondary | ICD-10-CM | POA: Insufficient documentation

## 2023-01-22 DIAGNOSIS — M25611 Stiffness of right shoulder, not elsewhere classified: Secondary | ICD-10-CM | POA: Insufficient documentation

## 2023-01-22 DIAGNOSIS — M6281 Muscle weakness (generalized): Secondary | ICD-10-CM | POA: Insufficient documentation

## 2023-01-22 DIAGNOSIS — D649 Anemia, unspecified: Secondary | ICD-10-CM | POA: Diagnosis not present

## 2023-01-22 NOTE — Therapy (Signed)
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   CAD (coronary artery disease) 07/20/2013   s/p remote PCI of RCA and LCX in 1998   Hypercholesterolemia    Hypertension    Osteoarthritis of right hip    Prostate cancer [facility])    brachytherapy   Past Surgical History:  Procedure Laterality Date   CORONARY ANGIOPLASTY WITH STENT PLACEMENT  06/21/1996   prior angioplasty of the RCA with stenting of the LCX in 1998   EYE SURGERY  1969   Right eye /    TONSILLECTOMY AND ADENOIDECTOMY     Patient Active Problem List   Diagnosis Date Noted   Asthmatic bronchitis with acute exacerbation 12/05/2021   History of prostate cancer 05/09/2014   Benign essential hypertension 10/27/2013   CAD (coronary artery disease) 07/20/2013   Hypertension 01/20/2012   Hypercholesterolemia 01/20/2012    PCP: Saguier, PA  REFERRING PROVIDER: Ranell Patrick, MD  REFERRING DIAG: S/P right reverse TSA  THERAPY DIAG:  Acute pain of right shoulder  Stiffness of right shoulder, not elsewhere classified  Muscle weakness (generalized)  Rationale for Evaluation and Treatment: Rehabilitation  ONSET DATE: 12/01/22  SUBJECTIVE:                                                                                                                                                                                      SUBJECTIVE STATEMENT: Patient reports  that he had a torn RC on the right, not repaired for a number of years, he underwent a right reverse TSA on 12/01/22.  Was in a sling 4 weeks and hs done pendulums only.  The MD reports a lot of mm wasting prior to surgery, has minimal deltoid use Hand dominance: Right  PERTINENT HISTORY: See above  PAIN:  Are you having pain? Yes: NPRS scale: 0/10 Pain location: right shoulder Pain description: sore, ache  Aggravating factors: reaching up and out pain up to 6/10 Relieving factors: not using it,  pain can be 0/10   PRECAUTIONS: Other: reverse TSA protocol  RED FLAGS: None   WEIGHT BEARING RESTRICTIONS: No  FALLS:  Has patient fallen in last 6 months? No  LIVING ENVIRONMENT: Lives with: lives with their family Lives in: House/apartment Stairs: Yes: Internal: 12 steps; can reach both Has following equipment at home: None  OCCUPATION: retired  PLOF: Independent and golf 3x/week  PATIENT GOALS:play golf again, have better motions and better stretngth  NEXT MD VISIT:   OBJECTIVE:  Note: Objective measures were completed at Evaluation unless otherwise noted.  DIAGNOSTIC FINDINGS:  None in chart, patient had recent x-rays at [facility] and looks good  PATIENT SURVEYS:  FOTO 31  COGNITION: Overall cognitive status: Within functional limits for tasks assessed     SENSATION: WFL  POSTURE: Fwd head, rounded shoulders  UPPER EXTREMITY ROM:   Active ROM Right AROM Eval Right PROM eval  Shoulder flexion 55 135  Shoulder extension    Shoulder abduction 20 120  Shoulder adduction    Shoulder internal rotation 30 40  Shoulder external rotation 15 30  Elbow flexion 110   Elbow extension 0   Wrist flexion    Wrist extension    Wrist ulnar deviation    Wrist radial deviation    Wrist pronation    Wrist supination    (Blank rows = not tested)  UPPER EXTREMITY MMT:  minimal serratus activity  MMT Right eval Left eval  Shoulder flexion 2   Shoulder  extension    Shoulder abduction 2-   Shoulder adduction    Shoulder internal rotation 4-   Shoulder external rotation 1+   Middle trapezius    Lower trapezius    Elbow flexion 3+   Elbow extension 4+   Wrist flexion    Wrist extension    Wrist ulnar deviation    Wrist radial deviation    Wrist pronation    Wrist supination    Grip strength (lbs)    (Blank rows = not tested)  PALPATION:  Large gap from [facility] joint to humeral head, minimal deltoid and RC mms   TODAY'S TREATMENT:                                                                                                                                         DATE:     PATIENT EDUCATION: Education details: HEP/POC Person educated: Patient Education method: Programmer, multimedia, Facilities manager, Actor cues, Verbal cues, and Handouts Education comprehension: verbalized understanding  HOME EXERCISE PROGRAM: Access Code: [email or web address] URL: [email or web address] Date: 01/22/2023 Prepared by: Stacie Glaze  Exercises - Seated Shoulder Flexion AAROM with Pulley Behind  - 1 x daily - 7 x weekly - 2 sets - 10 reps - 3 hold - Seated Shoulder Abduction AAROM with Pulley Behind  - 1 x daily - 7 x weekly - 2 sets - 10 reps - 3 hold -  Standing Shoulder Internal Rotation AAROM with Pulley  - 1 x daily - 7 x weekly - 2 sets - 10 reps - 3 hold - Supine Shoulder Alphabet  - 1 x daily - 7 x weekly - 2 sets - 10 reps - 3 hold - Supine Shoulder Press  - 1 x daily - 7 x weekly - 2 sets - 10 reps - 3 hold - Supine Single Arm Scapular Protraction  - 1 x daily - 7 x weekly - 2 sets - 10 reps - 3 hold - Supine Shoulder External Rotation with Dumbbell  - 1 x daily - 7 x weekly - 2 sets - 10 reps - 3 hold  ASSESSMENT:  CLINICAL IMPRESSION: Patient is a 77 y.o. male who was seen today for physical therapy evaluation and treatment for s/p right reverse total shoulder replacement.  He and the MD report a RC tear for > 2 years prior with  significant mm atrophy.  His PROM is not bad, he is just extremely weak and cannot raise arm up away from his body.  OBJECTIVE IMPAIRMENTS: decreased activity tolerance, decreased endurance, decreased ROM, decreased strength, decreased safety awareness, increased edema, increased fascial restrictions, increased muscle spasms, impaired flexibility, improper body mechanics, postural dysfunction, and pain.   REHAB POTENTIAL: Good  CLINICAL DECISION MAKING: Evolving/moderate complexity  EVALUATION COMPLEXITY: Low   GOALS: Goals reviewed with patient? Yes  SHORT TERM GOALS: Target date: 02/05/23  Independent with initial HEP Goal status: INITIAL  LONG TERM GOALS: Target date: 03/25/23  Independent with advanced HEP Goal status: INITIAL  2.  Increase AROM of the right shoulder to 80 degrees Goal status: INITIAL  3.  Increase ER of the right shoulder to 50 degrees Goal status: INITIAL  4.  Able to reach shoulder height cabinet Goal status: INITIAL  5.  No pain Goal status: INITIAL  6.  Dress without difficultyu Goal status: INITIAL  PLAN:  PT FREQUENCY: 1x/week  PT DURATION: 12 weeks  PLANNED INTERVENTIONS: 97164- PT Re-evaluation, 97110-Therapeutic exercises, 97530- Therapeutic activity, [id number]- Neuromuscular re-education, 97535- Self Care, [id number]- Manual therapy, 97014- Electrical stimulation (unattended), 97016- Vasopneumatic device, Patient/Family education, Taping, Cryotherapy, and Moist heat  PLAN FOR NEXT SESSION: work on functional motion and [employer], PT 01/22/2023, 10:22 AM

## 2023-01-22 NOTE — Progress Notes (Signed)
Specimen drop off

## 2023-01-25 ENCOUNTER — Encounter: Payer: Self-pay | Admitting: Medical

## 2023-01-25 LAB — FECAL OCCULT BLOOD, IMMUNOCHEMICAL: Fecal Occult Bld: NEGATIVE

## 2023-01-27 ENCOUNTER — Encounter: Payer: Medicare Other | Admitting: Physical Therapy

## 2023-01-27 DIAGNOSIS — C44212 Basal cell carcinoma of skin of right ear and external auricular canal: Secondary | ICD-10-CM | POA: Diagnosis not present

## 2023-02-01 ENCOUNTER — Ambulatory Visit: Payer: Medicare Other | Admitting: Physical Therapy

## 2023-02-01 ENCOUNTER — Ambulatory Visit: Payer: Medicare Other | Admitting: Medical

## 2023-02-01 ENCOUNTER — Encounter: Payer: Self-pay | Admitting: Medical

## 2023-02-01 ENCOUNTER — Encounter: Payer: Self-pay | Admitting: Physical Therapy

## 2023-02-01 ENCOUNTER — Ambulatory Visit (INDEPENDENT_AMBULATORY_CARE_PROVIDER_SITE_OTHER): Payer: Medicare Other | Admitting: Medical

## 2023-02-01 VITALS — BP 137/70 | HR 86 | Resp 18 | Ht 70.0 in | Wt 199.0 lb

## 2023-02-01 DIAGNOSIS — M25611 Stiffness of right shoulder, not elsewhere classified: Secondary | ICD-10-CM

## 2023-02-01 DIAGNOSIS — M25511 Pain in right shoulder: Secondary | ICD-10-CM | POA: Diagnosis not present

## 2023-02-01 DIAGNOSIS — K760 Fatty (change of) liver, not elsewhere classified: Secondary | ICD-10-CM | POA: Diagnosis not present

## 2023-02-01 DIAGNOSIS — R748 Abnormal levels of other serum enzymes: Secondary | ICD-10-CM | POA: Diagnosis not present

## 2023-02-01 DIAGNOSIS — D649 Anemia, unspecified: Secondary | ICD-10-CM

## 2023-02-01 DIAGNOSIS — Z8719 Personal history of other diseases of the digestive system: Secondary | ICD-10-CM | POA: Diagnosis not present

## 2023-02-01 DIAGNOSIS — K769 Liver disease, unspecified: Secondary | ICD-10-CM

## 2023-02-01 DIAGNOSIS — R790 Abnormal level of blood mineral: Secondary | ICD-10-CM

## 2023-02-01 DIAGNOSIS — M6281 Muscle weakness (generalized): Secondary | ICD-10-CM | POA: Diagnosis not present

## 2023-02-01 LAB — CBC WITH DIFFERENTIAL/PLATELET
Basophils Absolute: 0 10*3/uL (ref 0.0–0.1)
Basophils Relative: 0.8 % (ref 0.0–3.0)
Eosinophils Absolute: 0.2 10*3/uL (ref 0.0–0.7)
Eosinophils Relative: 3.2 % (ref 0.0–5.0)
HCT: 34 % — ABNORMAL LOW (ref 39.0–52.0)
Hemoglobin: 11 g/dL — ABNORMAL LOW (ref 13.0–17.0)
Lymphocytes Relative: 18.6 % (ref 12.0–46.0)
Lymphs Abs: 1.2 10*3/uL (ref 0.7–4.0)
MCHC: 32.2 g/dL (ref 30.0–36.0)
MCV: 101.1 fL — ABNORMAL HIGH (ref 78.0–100.0)
Monocytes Absolute: 1.3 10*3/uL — ABNORMAL HIGH (ref 0.1–1.0)
Monocytes Relative: 19.4 % — ABNORMAL HIGH (ref 3.0–12.0)
Neutro Abs: 3.8 10*3/uL (ref 1.4–7.7)
Neutrophils Relative %: 58 % (ref 43.0–77.0)
Platelets: 237 10*3/uL (ref 150.0–400.0)
RBC: 3.36 Mil/uL — ABNORMAL LOW (ref 4.22–5.81)
RDW: 16.4 % — ABNORMAL HIGH (ref 11.5–15.5)
WBC: 6.5 10*3/uL (ref 4.0–10.5)

## 2023-02-01 LAB — IRON: Iron: 251 ug/dL — ABNORMAL HIGH (ref 42–165)

## 2023-02-01 NOTE — Therapy (Signed)
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   CAD (coronary artery disease) 07/20/2013   s/p remote PCI of RCA and LCX in 1998   Hypercholesterolemia    Hypertension    Osteoarthritis of right hip    Prostate cancer [facility])    brachytherapy   Past Surgical History:  Procedure Laterality Date   CORONARY ANGIOPLASTY WITH STENT PLACEMENT  06/21/1996   prior angioplasty of the RCA with stenting of the LCX in 1998   EYE SURGERY  1969   Right eye /    TONSILLECTOMY AND ADENOIDECTOMY     Patient Active Problem List   Diagnosis Date Noted   Asthmatic bronchitis with acute exacerbation 12/05/2021   History of prostate cancer 05/09/2014   Benign essential hypertension 10/27/2013   CAD (coronary artery disease) 07/20/2013   Hypertension 01/20/2012   Hypercholesterolemia 01/20/2012    PCP: Saguier, PA  REFERRING PROVIDER: Ranell Patrick, MD  REFERRING DIAG: S/P right reverse TSA  THERAPY DIAG:  Acute pain of right shoulder  Muscle weakness (generalized)  Stiffness of right shoulder, not elsewhere classified  Rationale for Evaluation and Treatment: Rehabilitation  ONSET DATE: 12/01/22  SUBJECTIVE:                                                                                                                                                                                      SUBJECTIVE STATEMENT: Not as good as  good as he would like.  Hand dominance: Right  PERTINENT HISTORY: See above  PAIN:  Are you having pain? Yes: NPRS scale: 0/10 Pain location: right shoulder Pain description: sore, ache  Aggravating factors: reaching up and out pain up to 6/10 Relieving factors: not using it, pain can be 0/10   PRECAUTIONS: Other: reverse TSA protocol  RED FLAGS: None   WEIGHT BEARING RESTRICTIONS: No  FALLS:  Has patient fallen in last 6 months? No  LIVING ENVIRONMENT: Lives with: lives with their family Lives in: House/apartment  Stairs: Yes: Internal: 12 steps; can reach both Has following equipment at home: None  OCCUPATION: retired  PLOF: Independent and golf 3x/week  PATIENT GOALS:play golf again, have better motions and better stretngth  NEXT MD VISIT:   OBJECTIVE:  Note: Objective measures were completed at Evaluation unless otherwise noted.  DIAGNOSTIC FINDINGS:  None in chart, patient had recent x-rays at [facility] and looks good  PATIENT SURVEYS:  FOTO 31  COGNITION: Overall cognitive status: Within functional limits for tasks assessed     SENSATION: WFL  POSTURE: Fwd head, rounded shoulders  UPPER EXTREMITY ROM:   Active ROM Right AROM Eval Right PROM eval  Shoulder flexion 55 135  Shoulder extension    Shoulder abduction 20 120  Shoulder adduction    Shoulder internal rotation 30 40  Shoulder external rotation 15 30  Elbow flexion 110   Elbow extension 0   Wrist flexion    Wrist extension    Wrist ulnar deviation    Wrist radial deviation    Wrist pronation    Wrist supination    (Blank rows = not tested)  UPPER EXTREMITY MMT:  minimal serratus activity  MMT Right eval Left eval  Shoulder flexion 2   Shoulder extension    Shoulder abduction 2-   Shoulder adduction    Shoulder internal rotation 4-   Shoulder external rotation 1+   Middle trapezius    Lower trapezius    Elbow flexion 3+   Elbow extension 4+   Wrist flexion     Wrist extension    Wrist ulnar deviation    Wrist radial deviation    Wrist pronation    Wrist supination    Grip strength (lbs)    (Blank rows = not tested)  PALPATION:  Large gap from [facility] joint to humeral head, minimal deltoid and RC mms   TODAY'S TREATMENT:                                                                                                                                         DATE:  02/01/23 UBE L1 x 3 min AAROM 1LB WaTE flex, Ext 2x10 RUE AAORM ABD dowel 2x10 Rows & Ext red 2x10 Triceps Ext 20lb 2x10 RUE Biceps Curls 1lb x10, x5 RUE IR yellow w2x10 RUE ER yellow x10 x5  RUE PROM Supine Rue ER/IR, Flex x10 AROM  PATIENT EDUCATION: Education details: HEP/POC Person educated: Patient Education method: Programmer, multimedia, Facilities manager, Actor cues, Verbal cues, and Handouts Education comprehension: verbalized understanding  HOME EXERCISE PROGRAM: Access Code: [email or web address] URL: [email or web address] Date: 01/22/2023 Prepared by: Stacie Glaze  Exercises - Seated Shoulder Flexion AAROM with Pulley Behind  - 1 x daily - 7 x weekly - 2 sets - 10 reps - 3 hold - Seated Shoulder Abduction AAROM with Pulley Behind  - 1 x daily - 7 x weekly - 2 sets - 10 reps -  3 hold - Standing Shoulder Internal Rotation AAROM with Pulley  - 1 x daily - 7 x weekly - 2 sets - 10 reps - 3 hold - Supine Shoulder Alphabet  - 1 x daily - 7 x weekly - 2 sets - 10 reps - 3 hold - Supine Shoulder Press  - 1 x daily - 7 x weekly - 2 sets - 10 reps - 3 hold - Supine Single Arm Scapular Protraction  - 1 x daily - 7 x weekly - 2 sets - 10 reps - 3 hold - Supine Shoulder External Rotation with Dumbbell  - 1 x daily - 7 x weekly - 2 sets - 10 reps - 3 hold  ASSESSMENT:  CLINICAL IMPRESSION: Patient is a 77 y.o. male who was seen today for physical therapy treatment for s/p right reverse total shoulder replacement.  He and the MD report a RC tear for > 2 years prior with significant  mm atrophy.  His PROM remains good, he is just extremely weak and cannot raise arm up away from his body. Session focuses on active and active assited motions. Postural compensation present throughout due to R shoulder atrophy and weakness.   OBJECTIVE IMPAIRMENTS: decreased activity tolerance, decreased endurance, decreased ROM, decreased strength, decreased safety awareness, increased edema, increased fascial restrictions, increased muscle spasms, impaired flexibility, improper body mechanics, postural dysfunction, and pain.   REHAB POTENTIAL: Good  CLINICAL DECISION MAKING: Evolving/moderate complexity  EVALUATION COMPLEXITY: Low   GOALS: Goals reviewed with patient? Yes  SHORT TERM GOALS: Target date: 02/05/23  Independent with initial HEP Goal status: INITIAL  LONG TERM GOALS: Target date: 03/25/23  Independent with advanced HEP Goal status: INITIAL  2.  Increase AROM of the right shoulder to 80 degrees Goal status: INITIAL  3.  Increase ER of the right shoulder to 50 degrees Goal status: INITIAL  4.  Able to reach shoulder height cabinet Goal status: INITIAL  5.  No pain Goal status: INITIAL  6.  Dress without difficultyu Goal status: INITIAL  PLAN:  PT FREQUENCY: 1x/week  PT DURATION: 12 weeks  PLANNED INTERVENTIONS: 97164- PT Re-evaluation, 97110-Therapeutic exercises, 97530- Therapeutic activity, [id number]- Neuromuscular re-education, 97535- Self Care, [id number]- Manual therapy, 97014- Electrical stimulation (unattended), 97016- Vasopneumatic device, Patient/Family education, Taping, Cryotherapy, and Moist heat  PLAN FOR NEXT SESSION: work on functional motion and strength   Grayce Sessions, PTA 02/01/2023, 2:27 PM

## 2023-02-01 NOTE — Progress Notes (Addendum)
Subjective:    Patient ID: Edwena Felty, male    DOB: September 04, 1945, 77 y.o.   MRN: [id number]  HPI  Discussed the use of AI scribe software for clinical note transcription with the patient, who gave verbal consent to proceed.  History of Present Illness   The patient, with a history of anemia and a bleeding ulcer diagnosed approximately a year and a half ago,. Recent lab work up showed recurrent anemia. He reports experiencing fatigue similar to when he had severe anemia in the past, particularly when going up steps.    However, since starting iron supplementation, he notes a significant improvement in energy levels.  The patient also reports a history of mucus production and a cough, which he initially thought might be related to his fatigue. He was treated for bronchitis with antibiotics, a cough tablet, and a six-day course of Medrol, in addition to his regular medications of Symbicort, Flonase, and Zyrtec. He reports an improvement in these symptoms, with only occasional mucus production now.  Prior to starting iron supplementation, the patient noticed a change in his stool, describing it as dark and almost liquid at the end of bowel movements. He also reports a history of polyps found during a colonoscopy performed last April, with a recommendation for a repeat procedure in a year. He is due for a follow-up with his gastroenterologist in the coming months.       Review of Systems  Constitutional:  Negative for chills and fatigue.  HENT:  Positive for congestion.        Much better.  Respiratory:  Negative for cough, shortness of breath and wheezing.   Cardiovascular:  Negative for chest pain and palpitations.  Gastrointestinal:  Negative for abdominal pain.  Genitourinary:  Negative for dysuria, frequency and hematuria.  Musculoskeletal:  Negative for back pain and neck pain.  Skin:  Negative for rash.  Neurological:  Negative for dizziness, syncope, weakness and  light-headedness.  Hematological:  Negative for adenopathy. Does not bruise/bleed easily.  Psychiatric/Behavioral:  Negative for behavioral problems, confusion and suicidal ideas. The patient is not nervous/anxious.     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   CAD (coronary artery disease) 07/20/2013   s/p remote PCI of RCA and LCX in 1998   Hypercholesterolemia    Hypertension    Osteoarthritis of right hip    Prostate cancer [facility])    brachytherapy     Social History   Socioeconomic History   Marital status: Married    Spouse name: Not on file   Number of children: 2   Years of education: Not on file   Highest education level: Bachelor's degree (e.g., BA, AB, BS)  Occupational History   Occupation: Garment/textile technologist: [employer]  Tobacco Use   Smoking status: Never   Smokeless tobacco: Never  Vaping Use   Vaping status: Never Used  Substance and Sexual Activity   Alcohol use: Yes    Alcohol/week: 14.0 standard drinks of alcohol    Types: 14 Cans of beer per week    Comment: couple of beers a day. [employer].   Drug use: No   Sexual activity: Yes  Other Topics Concern   Not on file  Social History Narrative   Not on file   Social Drivers of Health   Financial Resource Strain: Low Risk  (  09/30/2022)   Overall Financial Resource Strain (CARDIA)    Difficulty of Paying Living Expenses: Not hard at all  Food Insecurity: No Food Insecurity (09/30/2022)   Hunger Vital Sign    Worried About Running Out of Food in the Last Year: Never true    Ran Out of Food in the Last Year: Never true  Transportation Needs: No Transportation Needs (09/30/2022)   PRAPARE - Administrator, Civil Service (Medical): No    Lack of Transportation (Non-Medical): No  Physical Activity: Sufficiently Active (09/30/2022)   Exercise Vital Sign    Days of Exercise per  Week: 4 days    Minutes of Exercise per Session: 120 min  Stress: No Stress Concern Present (09/30/2022)   [employer] of Occupational Health - Occupational Stress Questionnaire    Feeling of Stress : Not at all  Social Connections: Unknown (09/30/2022)   Social Connection and Isolation Panel [NHANES]    Frequency of Communication with Friends and Family: Twice a week    Frequency of Social Gatherings with Friends and Family: Patient declined    Attends Religious Services: Patient declined    Database administrator or Organizations: Patient declined    Attends Banker Meetings: 1 to 4 times per year    Marital Status: Married  Catering manager Violence: Not At Risk (05/28/2022)   Humiliation, Afraid, Rape, and Kick questionnaire    Fear of Current or Ex-Partner: No    Emotionally Abused: No    Physically Abused: No    Sexually Abused: No    Past Surgical History:  Procedure Laterality Date   CORONARY ANGIOPLASTY WITH STENT PLACEMENT  06/21/1996   prior angioplasty of the RCA with stenting of the LCX in 1998   EYE SURGERY  1969   Right eye /    TONSILLECTOMY AND ADENOIDECTOMY      Family History  Problem Relation Age of Onset   Heart failure Mother    Esophageal cancer Father        died age 26, exp to asbestos   Cancer Father    Diabetes Sister    Allergies Sister    Heart attack Maternal Uncle 67   Coronary artery disease Maternal Uncle 21       with CABG    No Known Allergies  Current Outpatient Medications on File Prior to Visit  Medication Sig Dispense Refill   albuterol (VENTOLIN HFA) 108 (90 Base) MCG/ACT inhaler USE 2 INHALATIONS BY MOUTH EVERY 6 HOURS AS NEEDED FOR WHEEZING  OR SHORTNESS OF BREATH 34 g 2   aspirin 81 MG tablet Take 81 mg by mouth every other day.      azelastine (ASTELIN) 0.1 % nasal spray Place 1 spray into both nostrils 2 (two) times daily. Use in each nostril as directed 30 mL 5   benzonatate (TESSALON) 100 MG capsule Take  1 capsule (100 mg total) by mouth 3 (three) times daily as needed for cough. 30 capsule 0   budesonide-formoterol (SYMBICORT) 160-4.5 MCG/ACT inhaler Inhale 2 puffs into the lungs 2 (two) times daily. 1 each 11   ezetimibe (ZETIA) 10 MG tablet TAKE 1 TABLET BY MOUTH DAILY 100 tablet 2   fluticasone (FLONASE) 50 MCG/ACT nasal spray Place 2 sprays into both nostrils daily. (Patient taking differently: Place 2 sprays into both nostrils daily as needed.) 16 g 2   ipratropium (ATROVENT) 0.02 % nebulizer solution Take 0.5 mg by nebulization every 4 (four) hours as needed  for wheezing or shortness of breath.     Iron, Ferrous Sulfate, 325 (65 Fe) MG TABS 1 tab po bid 60 tablet 1   levalbuterol (XOPENEX) 0.63 MG/3ML nebulizer solution Take 0.63 mg by nebulization every 4 (four) hours as needed for wheezing or shortness of breath.     losartan (COZAAR) 25 MG tablet TAKE 1 TABLET BY MOUTH DAILY 100 tablet 2   methylPREDNISolone (MEDROL) 4 MG tablet Standard 6 day taper 21 tablet 0   Omega-3 Fatty Acids (OMEGA 3 PO) Take 1 capsule by mouth daily.     senna (SENOKOT) 8.6 MG tablet Take 1 tablet by mouth every other day.     No current facility-administered medications on file prior to visit.    BP 137/70   Pulse 86   Resp 18   Ht 5\' 10"  (1.778 m)   Wt 199 lb (90.3 kg)   SpO2 98%   BMI 28.55 kg/m           Objective:   Physical Exam  General Mental Status- Alert. General Appearance- Not in acute distress.   Skin General: Color- Normal Color. Moisture- Normal Moisture.  Neck Carotid Arteries- Normal color. Moisture- Normal Moisture. No carotid bruits. No JVD.  Chest and Lung Exam Auscultation: Breath Sounds:-CTA  Cardiovascular Auscultation:Rythm- RRR Murmurs & Other Heart Sounds:Auscultation of the heart reveals- No Murmurs.  Abdomen Inspection:-Inspeection Normal. Palpation/Percussion:Note:No mass. Palpation and Percussion of the abdomen reveal- Non Tender, Non Distended + BS,  no rebound or guarding.   Neurologic Cranial Nerve exam:- CN III-XII intact(No nystagmus), symmetric smile. Strength:- 5/5 equal and symmetric strength both upper and lower extremities.       Assessment & Plan:   Assessment and Plan    Patient Instructions  Anemia. Low iron studies. Hemoglobin of 9.5 and hematocrit of 29.4 twelve days ago. Iron level was 34 and ferritin was low. No dietary restrictions. Patient reports improved energy with iron supplementation. History of bleeding ulcer 1.5 years ago. Recent dark stools prior to iron supplementation. -Continue iron supplementation. -Repeat CBC and iron levels today to assess response to treatment. -Refer to Gastroenterology for consideration of EGD and/or earlier colonoscopy given history of bleeding ulcer and recent anemia.  Bronchitis and rhinitis signs/symptoms much improved. Recent history of fatigue, cough, and mucus production. Treated for bronchitis with antibiotics, cough tablets, and Medrol.  Continued Symbicort, Flonase, and Zyrtec. Symptoms have improved. -Report any worsening or new symptoms.  For lft elefvation and history of fatty liver will repeat liver US. -also will add on fatty liver to today gi referral.   Follow up date to be determined after lab review.   Esperanza Richters, PA-C   Time spent with patient today was 42  minutes which consisted of chart review, discussing diagnosis, work up treatment and documentation.

## 2023-02-01 NOTE — Addendum Note (Signed)
Addended by: Gwenevere Abbot on: 02/01/2023 09:21 PM   Modules accepted: Orders

## 2023-02-01 NOTE — Addendum Note (Signed)
Addended by: Gwenevere Abbot on: 02/01/2023 11:47 AM   Modules accepted: Orders, Level of Service

## 2023-02-01 NOTE — Patient Instructions (Addendum)
Anemia. Low iron studies. Hemoglobin of 9.5 and hematocrit of 29.4 twelve days ago. Iron level was 34 and ferritin was low. No dietary restrictions. Patient reports improved energy with iron supplementation. History of bleeding ulcer 1.5 years ago. Recent dark stools prior to iron supplementation. -Continue iron supplementation. -Repeat CBC and iron levels today to assess response to treatment. -Refer to Gastroenterology for consideration of EGD and/or earlier colonoscopy given history of bleeding ulcer and recent anemia.  Bronchitis and rhinitis signs/symptoms much improved. Recent history of fatigue, cough, and mucus production. Treated for bronchitis with antibiotics, cough tablets, and Medrol.  Continued Symbicort, Flonase, and Zyrtec. Symptoms have improved. -Report any worsening or new symptoms.  For lft elevation and history of fatty liver will repeat liver US. -also will add on fatty liver to today gi referral.   Follow up date to be determined after lab review.

## 2023-02-01 NOTE — Addendum Note (Signed)
Addended by: Gwenevere Abbot on: 02/01/2023 09:18 PM   Modules accepted: Orders

## 2023-02-02 DIAGNOSIS — N390 Urinary tract infection, site not specified: Secondary | ICD-10-CM | POA: Diagnosis not present

## 2023-02-02 DIAGNOSIS — H5202 Hypermetropia, left eye: Secondary | ICD-10-CM | POA: Diagnosis not present

## 2023-02-02 DIAGNOSIS — H2512 Age-related nuclear cataract, left eye: Secondary | ICD-10-CM | POA: Diagnosis not present

## 2023-02-03 ENCOUNTER — Ambulatory Visit (HOSPITAL_BASED_OUTPATIENT_CLINIC_OR_DEPARTMENT_OTHER)
Admission: RE | Admit: 2023-02-03 | Discharge: 2023-02-03 | Disposition: A | Discharge status: Home / Self Care | Payer: Medicare Other | Source: Ambulatory Visit | Attending: Medical | Admitting: Medical

## 2023-02-03 DIAGNOSIS — R748 Abnormal levels of other serum enzymes: Secondary | ICD-10-CM | POA: Insufficient documentation

## 2023-02-03 DIAGNOSIS — K824 Cholesterolosis of gallbladder: Secondary | ICD-10-CM | POA: Diagnosis not present

## 2023-02-03 DIAGNOSIS — K760 Fatty (change of) liver, not elsewhere classified: Secondary | ICD-10-CM | POA: Diagnosis not present

## 2023-02-03 DIAGNOSIS — K769 Liver disease, unspecified: Secondary | ICD-10-CM | POA: Diagnosis not present

## 2023-02-07 NOTE — Addendum Note (Signed)
Addended by: Gwenevere Abbot on: 02/07/2023 09:28 PM   Modules accepted: Orders

## 2023-02-08 ENCOUNTER — Encounter: Payer: Self-pay | Admitting: Medical

## 2023-02-12 ENCOUNTER — Encounter: Payer: Self-pay | Admitting: Physical Therapy

## 2023-02-12 ENCOUNTER — Ambulatory Visit: Payer: Medicare Other | Admitting: Physical Therapy

## 2023-02-12 DIAGNOSIS — M25611 Stiffness of right shoulder, not elsewhere classified: Secondary | ICD-10-CM

## 2023-02-12 DIAGNOSIS — M25511 Pain in right shoulder: Secondary | ICD-10-CM | POA: Diagnosis not present

## 2023-02-12 DIAGNOSIS — M6281 Muscle weakness (generalized): Secondary | ICD-10-CM | POA: Diagnosis not present

## 2023-02-12 NOTE — Therapy (Signed)
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   CAD (coronary artery disease) 07/20/2013   s/p remote PCI of RCA and LCX in 1998   Hypercholesterolemia    Hypertension    Osteoarthritis of right hip    Prostate cancer [facility])    brachytherapy   Past Surgical History:  Procedure Laterality Date   CORONARY ANGIOPLASTY WITH STENT PLACEMENT  06/21/1996   prior angioplasty of the RCA with stenting of the LCX in 1998   EYE SURGERY  1969   Right eye /    TONSILLECTOMY AND ADENOIDECTOMY     Patient Active Problem List   Diagnosis Date Noted   Asthmatic bronchitis with acute exacerbation 12/05/2021   History of prostate cancer 05/09/2014   Benign essential hypertension 10/27/2013   CAD (coronary artery disease) 07/20/2013   Hypertension 01/20/2012   Hypercholesterolemia 01/20/2012    PCP: Saguier, PA  REFERRING PROVIDER: Ranell Patrick, MD  REFERRING DIAG: S/P right reverse TSA  THERAPY DIAG:  Acute pain of right shoulder  Muscle weakness (generalized)  Stiffness of right shoulder, not elsewhere classified  Rationale for Evaluation and Treatment: Rehabilitation  ONSET DATE: 12/01/22  SUBJECTIVE:                                                                                                                                                                                      SUBJECTIVE STATEMENT: Still can't lift  the arm Hand dominance: Right  PERTINENT HISTORY: See above  PAIN:  Are you having pain? Yes: NPRS scale: 0/10 Pain location: right shoulder Pain description: sore, ache  Aggravating factors: reaching up and out pain up to 6/10 Relieving factors: not using it, pain can be 0/10   PRECAUTIONS: Other: reverse TSA protocol  RED FLAGS: None   WEIGHT BEARING RESTRICTIONS: No  FALLS:  Has patient fallen in last 6 months? No  LIVING ENVIRONMENT: Lives with: lives with their family Lives in: House/apartment Stairs: Yes: Internal: 12 steps;  can reach both Has following equipment at home: None  OCCUPATION: retired  PLOF: Independent and golf 3x/week  PATIENT GOALS:play golf again, have better motions and better stretngth  NEXT MD VISIT:   OBJECTIVE:  Note: Objective measures were completed at Evaluation unless otherwise noted.  DIAGNOSTIC FINDINGS:  None in chart, patient had recent x-rays at [facility] and looks good  PATIENT SURVEYS:  FOTO 31  COGNITION: Overall cognitive status: Within functional limits for tasks assessed     SENSATION: WFL  POSTURE: Fwd head, rounded shoulders  UPPER EXTREMITY ROM:   Active ROM Right AROM Eval Right PROM eval  Shoulder flexion 55 135  Shoulder extension    Shoulder abduction 20 120  Shoulder adduction    Shoulder internal rotation 30 40  Shoulder external rotation 15 30  Elbow flexion 110   Elbow extension 0   Wrist flexion    Wrist extension    Wrist ulnar deviation    Wrist radial deviation    Wrist pronation    Wrist supination    (Blank rows = not tested)  UPPER EXTREMITY MMT:  minimal serratus activity  MMT Right eval Left eval  Shoulder flexion 2   Shoulder extension    Shoulder abduction 2-   Shoulder adduction    Shoulder internal rotation 4-   Shoulder external rotation 1+   Middle trapezius    Lower trapezius    Elbow flexion 3+   Elbow extension 4+   Wrist flexion    Wrist extension     Wrist ulnar deviation    Wrist radial deviation    Wrist pronation    Wrist supination    Grip strength (lbs)    (Blank rows = not tested)  PALPATION:  Large gap from [facility] joint to humeral head, minimal deltoid and RC mms   TODAY'S TREATMENT:                                                                                                                                         DATE:  02/12/23 NuStep L5 x 6 min Rows & Lats 15lb 2x10  Shoulder Ext 5lb 2x10 Shoulder Flex & Abd AROM x10 Shoulder ER/IR 2x10 Triceps Ext 25lb 2x10 Biceps Curls 2lb 2x10 Supine RUE AROM in all directions  02/01/23 UBE L1 x 3 min AAROM 1LB WaTE flex, Ext 2x10 RUE AAORM ABD dowel 2x10 Rows & Ext red 2x10 Triceps Ext 20lb 2x10 RUE Biceps Curls 1lb x10, x5 RUE IR yellow w2x10 RUE ER yellow x10 x5  RUE PROM Supine Rue ER/IR, Flex x10 AROM  PATIENT EDUCATION: Education details: HEP/POC Person educated: Patient Education method: Programmer, multimedia, Facilities manager, Actor cues, Verbal cues, and Handouts Education comprehension: verbalized understanding  HOME EXERCISE PROGRAM: Access Code: [email or web address] URL: [email or web address] Date: 01/22/2023 Prepared by: Stacie Glaze  Exercises - Seated Shoulder Flexion AAROM with Pulley Behind  - 1 x daily -  7 x weekly - 2 sets - 10 reps - 3 hold - Seated Shoulder Abduction AAROM with Pulley Behind  - 1 x daily - 7 x weekly - 2 sets - 10 reps - 3 hold - Standing Shoulder Internal Rotation AAROM with Pulley  - 1 x daily - 7 x weekly - 2 sets - 10 reps - 3 hold - Supine Shoulder Alphabet  - 1 x daily - 7 x weekly - 2 sets - 10 reps - 3 hold - Supine Shoulder Press  - 1 x daily - 7 x weekly - 2 sets - 10 reps - 3 hold - Supine Single Arm Scapular Protraction  - 1 x daily - 7 x weekly - 2 sets - 10 reps - 3 hold - Supine Shoulder External Rotation with Dumbbell  - 1 x daily - 7 x weekly - 2 sets - 10 reps - 3 hold  ASSESSMENT:  CLINICAL  IMPRESSION: Patient is a 77 y.o. male who was seen today for physical therapy treatment for s/p right reverse total shoulder replacement.  He and the MD report a RC tear for > 2 years prior with significant mm atrophy.  His PROM remains good, he is just extremely weak and cannot raise arm up away from his body. Contimed with RUE strengthening, but with significant postural compensation due to R shoulder weakness. Pt stated that his R shoulder was progressively getting weaker prior to surgery.  OBJECTIVE IMPAIRMENTS: decreased activity tolerance, decreased endurance, decreased ROM, decreased strength, decreased safety awareness, increased edema, increased fascial restrictions, increased muscle spasms, impaired flexibility, improper body mechanics, postural dysfunction, and pain.   REHAB POTENTIAL: Good  CLINICAL DECISION MAKING: Evolving/moderate complexity  EVALUATION COMPLEXITY: Low   GOALS: Goals reviewed with patient? Yes  SHORT TERM GOALS: Target date: 02/05/23  Independent with initial HEP Goal status: Met 02/12/23  LONG TERM GOALS: Target date: 03/25/23  Independent with advanced HEP Goal status: INITIAL  2.  Increase AROM of the right shoulder to 80 degrees Goal status: INITIAL  3.  Increase ER of the right shoulder to 50 degrees Goal status: INITIAL  4.  Able to reach shoulder height cabinet Goal status: INITIAL  5.  No pain Goal status: INITIAL  6.  Dress without difficultyu Goal status: INITIAL  PLAN:  PT FREQUENCY: 1x/week  PT DURATION: 12 weeks  PLANNED INTERVENTIONS: 97164- PT Re-evaluation, 97110-Therapeutic exercises, 97530- Therapeutic activity, [id number]- Neuromuscular re-education, 97535- Self Care, [id number]- Manual therapy, 97014- Electrical stimulation (unattended), 97016- Vasopneumatic device, Patient/Family education, Taping, Cryotherapy, and Moist heat  PLAN FOR NEXT SESSION: work on functional motion and strength   Grayce Sessions, PTA 02/12/2023,  8:45 AM

## 2023-02-15 ENCOUNTER — Other Ambulatory Visit: Payer: Self-pay

## 2023-02-15 ENCOUNTER — Ambulatory Visit: Payer: Medicare Other

## 2023-02-15 DIAGNOSIS — M25611 Stiffness of right shoulder, not elsewhere classified: Secondary | ICD-10-CM

## 2023-02-15 DIAGNOSIS — M25511 Pain in right shoulder: Secondary | ICD-10-CM | POA: Diagnosis not present

## 2023-02-15 DIAGNOSIS — M6281 Muscle weakness (generalized): Secondary | ICD-10-CM

## 2023-02-15 NOTE — Therapy (Signed)
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   CAD (coronary artery disease) 07/20/2013   s/p remote PCI of RCA and LCX in 1998   Hypercholesterolemia    Hypertension    Osteoarthritis of right hip    Prostate cancer [facility])    brachytherapy   Past Surgical History:  Procedure Laterality Date   CORONARY ANGIOPLASTY WITH STENT PLACEMENT  06/21/1996   prior angioplasty of the RCA with stenting of the LCX in 1998   EYE SURGERY  1969   Right eye /    TONSILLECTOMY AND ADENOIDECTOMY     Patient Active Problem List   Diagnosis Date Noted   Asthmatic bronchitis with acute exacerbation 12/05/2021   History of prostate cancer 05/09/2014   Benign essential hypertension 10/27/2013   CAD (coronary artery disease) 07/20/2013   Hypertension 01/20/2012   Hypercholesterolemia 01/20/2012    PCP: Saguier, PA  REFERRING PROVIDER: Ranell Patrick, MD  REFERRING DIAG: S/P right reverse TSA  THERAPY DIAG:  Acute pain of right shoulder  Muscle weakness (generalized)  Stiffness of right shoulder, not elsewhere classified  Rationale for Evaluation and Treatment: Rehabilitation  ONSET DATE: 12/01/22  SUBJECTIVE:                                                                                                                                                                                       SUBJECTIVE STATEMENT: Not sore, just weak R shoulder Hand dominance: Right  PERTINENT HISTORY: See above  PAIN:  Are you having pain? Yes: NPRS scale: 0/10 Pain location: right shoulder Pain description: sore, ache  Aggravating factors: reaching up and out pain up to 6/10 Relieving factors: not using it, pain can be 0/10   PRECAUTIONS: Other: reverse TSA protocol  RED FLAGS: None   WEIGHT BEARING RESTRICTIONS: No  FALLS:  Has patient fallen in last 6 months? No  LIVING ENVIRONMENT: Lives with: lives  with their family Lives in: House/apartment Stairs: Yes: Internal: 12 steps; can reach both Has following equipment at home: None  OCCUPATION: retired  PLOF: Independent and golf 3x/week  PATIENT GOALS:play golf again, have better motions and better stretngth  NEXT MD VISIT:   OBJECTIVE:  Note: Objective measures were completed at Evaluation unless otherwise noted.  DIAGNOSTIC FINDINGS:  None in chart, patient had recent x-rays at [facility] and looks good  PATIENT SURVEYS:  FOTO 31  COGNITION: Overall cognitive status: Within functional limits for tasks assessed     SENSATION: WFL  POSTURE: Fwd head, rounded shoulders  UPPER EXTREMITY ROM:   Active ROM Right AROM Eval Right PROM eval  Shoulder flexion 55 135  Shoulder extension    Shoulder abduction 20 120  Shoulder adduction    Shoulder internal rotation 30 40  Shoulder external rotation 15 30  Elbow flexion 110   Elbow extension 0   Wrist flexion    Wrist extension    Wrist ulnar deviation    Wrist radial deviation    Wrist pronation    Wrist supination    (Blank rows = not tested)  UPPER EXTREMITY MMT:  minimal serratus activity  MMT Right eval Left eval  Shoulder flexion 2   Shoulder extension    Shoulder abduction 2-   Shoulder adduction    Shoulder internal rotation 4-   Shoulder external rotation 1+   Middle trapezius    Lower trapezius    Elbow flexion 3+   Elbow  extension 4+   Wrist flexion    Wrist extension    Wrist ulnar deviation    Wrist radial deviation    Wrist pronation    Wrist supination    Grip strength (lbs)    (Blank rows = not tested)  PALPATION:  Large gap from [facility] joint to humeral head, minimal deltoid and RC mms   TODAY'S TREATMENT:                                                                                                                                         DATE:  02/15/23: NuStep L5 x 6 min Supine for R serratus punches with 1# Supine R shoulder abd/add with therapist manual resistance,to 90 degrees, Supine rhythmic stabilization with R arm adducted, maintaining elbow flexed to 90 degrees, perturbation provided by therapist Supine for rhythmic stabilization with arm adducted, elbow 90 degrees, holding yellow t band, therapist providing Er force to engage IR strength Supine with strap and horizontal abduction isometric contraction, with B shoulder flexion Supine with horizontal abd force B shoulders with chest press Side lying L for R shoulder Er with towel roll under R axilla 1 # Seated biceps curls, no wt 10x Shoulder Ext 5lb 2x10 Triceps Ext 25lb 2x10 Isometrics R shoulder abduction 5 sec holds, to isolate lateral delts   02/12/23 NuStep L5 x 6 min Rows &  Lats 15lb 2x10  Shoulder Ext 5lb 2x10 Shoulder Flex & Abd AROM x10 Shoulder ER/IR 2x10 Triceps Ext 25lb 2x10 Biceps Curls 2lb 2x10 Supine RUE AROM in all directions  02/01/23 UBE L1 x 3 min AAROM 1LB WaTE flex, Ext 2x10 RUE AAORM ABD dowel 2x10 Rows & Ext red 2x10 Triceps Ext 20lb 2x10 RUE Biceps Curls 1lb x10, x5 RUE IR yellow w2x10 RUE ER yellow x10 x5  RUE PROM Supine Rue ER/IR, Flex x10 AROM  PATIENT EDUCATION: Education details: HEP/POC Person educated: Patient Education method: Programmer, multimedia, Facilities manager, Actor cues, Verbal cues, and Handouts Education comprehension: verbalized understanding  HOME EXERCISE PROGRAM: Access Code:  [email or web address] URL: [email or web address] Date: 01/22/2023 Prepared by: Stacie Glaze  Exercises - Seated Shoulder Flexion AAROM with Pulley Behind  - 1 x daily - 7 x weekly - 2 sets - 10 reps - 3 hold - Seated Shoulder Abduction AAROM with Pulley Behind  - 1 x daily - 7 x weekly - 2 sets - 10 reps - 3 hold - Standing Shoulder Internal Rotation AAROM with Pulley  - 1 x daily - 7 x weekly - 2 sets - 10 reps - 3 hold - Supine Shoulder Alphabet  - 1 x daily - 7 x weekly - 2 sets - 10 reps - 3 hold - Supine Shoulder Press  - 1 x daily - 7 x weekly - 2 sets - 10 reps - 3 hold - Supine Single Arm Scapular Protraction  - 1 x daily - 7 x weekly - 2 sets - 10 reps - 3 hold - Supine Shoulder External Rotation with Dumbbell  - 1 x daily - 7 x weekly - 2 sets - 10 reps - 3 hold  ASSESSMENT:  CLINICAL IMPRESSION: Patient is a 77 y.o. male who participated today in a skilled  physical therapy treatment for s/p right reverse total shoulder replacement.  Continued with RUE strengthening, periscapular, with rhythmic stabilization, and isometrics to isolate motor control R lateral deltoids especially. Still with significant postural compensation due to R shoulder weakness. Tolerated well.   OBJECTIVE IMPAIRMENTS: decreased activity tolerance, decreased endurance, decreased ROM, decreased strength, decreased safety awareness, increased edema, increased fascial restrictions, increased muscle spasms, impaired flexibility, improper body mechanics, postural dysfunction, and pain.   REHAB POTENTIAL: Good  CLINICAL DECISION MAKING: Evolving/moderate complexity  EVALUATION COMPLEXITY: Low   GOALS: Goals reviewed with patient? Yes  SHORT TERM GOALS: Target date: 02/05/23  Independent with initial HEP Goal status: Met 02/12/23  LONG TERM GOALS: Target date: 03/25/23  Independent with advanced HEP Goal status: INITIAL  2.  Increase AROM of the right shoulder to 80 degrees Goal status:  INITIAL  3.  Increase ER of the right shoulder to 50 degrees Goal status: INITIAL  4.  Able to reach shoulder height cabinet Goal status: INITIAL  5.  No pain Goal status: INITIAL  6.  Dress without difficultyu Goal status: INITIAL  PLAN:  PT FREQUENCY: 1x/week  PT DURATION: 12 weeks  PLANNED INTERVENTIONS: 97164- PT Re-evaluation, 97110-Therapeutic exercises, 97530- Therapeutic activity, 97112- Neuromuscular re-education, 97535- Self Care, 08657- Manual therapy, 97014- Electrical stimulation (unattended), 97016- Vasopneumatic device, Patient/Family education, Taping, Cryotherapy, and Moist heat  PLAN FOR NEXT SESSION: work on functional motion and strength, would possibly try kinesiotaping R shoulder to assist the lateral deltoids due to large GH gap, biomechanical disadvantage.   Rhet Rorke L Ceniya Fowers, PT, DPT, OCS 02/15/2023, 4:27 PM

## 2023-02-22 ENCOUNTER — Encounter: Payer: Self-pay | Admitting: Physical Therapy

## 2023-02-22 ENCOUNTER — Ambulatory Visit: Payer: PPO | Attending: Orthopedic Surgery | Admitting: Physical Therapy

## 2023-02-22 DIAGNOSIS — M6281 Muscle weakness (generalized): Secondary | ICD-10-CM | POA: Diagnosis not present

## 2023-02-22 DIAGNOSIS — M25611 Stiffness of right shoulder, not elsewhere classified: Secondary | ICD-10-CM | POA: Insufficient documentation

## 2023-02-22 DIAGNOSIS — M25511 Pain in right shoulder: Secondary | ICD-10-CM | POA: Insufficient documentation

## 2023-02-22 NOTE — Therapy (Signed)
 OUTPATIENT PHYSICAL THERAPY SHOULDER TREATMENT   Patient Name: Ruben Henry MRN: [id number] DOB:Sep 19, 1945, 78 y.o., male Today's Date: 02/22/2023  END OF SESSION:  PT End of Session - 02/22/23 1354     Visit Number 5    Date for PT Re-Evaluation 03/25/23    Authorization Type [employer] Medicare    PT Start Time 1354    PT Stop Time 1442    PT Time Calculation (min) 48 min    Activity Tolerance Patient tolerated treatment well    Behavior During Therapy [facility] for tasks assessed/performed              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   CAD (coronary artery disease) 07/20/2013   s/p remote PCI of RCA and LCX in 1998   Hypercholesterolemia    Hypertension    Osteoarthritis of right hip    Prostate cancer [facility])    brachytherapy   Past Surgical History:  Procedure Laterality Date   CORONARY ANGIOPLASTY WITH STENT PLACEMENT  06/21/1996   prior angioplasty of the RCA with stenting of the LCX in 1998   EYE SURGERY  1969   Right eye /    TONSILLECTOMY AND ADENOIDECTOMY     Patient Active Problem List   Diagnosis Date Noted   Asthmatic bronchitis with acute exacerbation 12/05/2021   History of prostate cancer 05/09/2014   Benign essential hypertension 10/27/2013   CAD (coronary artery disease) 07/20/2013   Hypertension 01/20/2012   Hypercholesterolemia 01/20/2012    PCP: Saguier, PA  REFERRING PROVIDER: Kay, MD  REFERRING DIAG: S/P right reverse TSA  THERAPY DIAG:  Acute pain of right shoulder  Muscle weakness (generalized)  Stiffness of right shoulder, not elsewhere classified  Rationale for Evaluation and Treatment: Rehabilitation  ONSET DATE: 12/01/22  SUBJECTIVE:                                                                                                                                                                                       SUBJECTIVE STATEMENT: I am doing better overall just really cannot lift he arm Hand dominance: Right  PERTINENT HISTORY: See above  PAIN:  Are you having pain? Yes: NPRS scale: 0/10 Pain location: right shoulder Pain description: sore, ache  Aggravating factors: reaching up and out pain up to 6/10 Relieving factors: not using it, pain can be 0/10   PRECAUTIONS: Other: reverse TSA protocol  RED FLAGS: None   WEIGHT BEARING RESTRICTIONS: No  FALLS:  Has patient fallen in last 6 months? No  LIVING  ENVIRONMENT: Lives with: lives with their family Lives in: House/apartment Stairs: Yes: Internal: 12 steps; can reach both Has following equipment at home: None  OCCUPATION: retired  PLOF: Independent and golf 3x/week  PATIENT GOALS:play golf again, have better motions and better stretngth  NEXT MD VISIT:   OBJECTIVE:  Note: Objective measures were completed at Evaluation unless otherwise noted.  DIAGNOSTIC FINDINGS:  None in chart, patient had recent x-rays at [facility] and looks good  PATIENT SURVEYS:  FOTO 31  COGNITION: Overall cognitive status: Within functional limits for tasks assessed     SENSATION: WFL  POSTURE: Fwd head, rounded shoulders  UPPER EXTREMITY ROM:   Active ROM Right AROM Eval Right PROM eval Right  AROM 02/22/23  Shoulder flexion 55 135 70  Shoulder extension     Shoulder abduction 20 120 40  Shoulder adduction     Shoulder internal rotation 30 40   Shoulder external rotation 15 30   Elbow flexion 110    Elbow extension 0    Wrist flexion     Wrist extension     Wrist ulnar deviation     Wrist radial deviation     Wrist pronation     Wrist supination     (Blank rows = not tested)  UPPER EXTREMITY MMT:  minimal serratus activity  MMT Right eval Left eval  Shoulder flexion 2   Shoulder extension    Shoulder abduction 2-   Shoulder adduction    Shoulder internal rotation 4-   Shoulder external rotation 1+    Middle trapezius    Lower trapezius    Elbow flexion 3+   Elbow extension 4+   Wrist flexion    Wrist extension    Wrist ulnar deviation    Wrist radial deviation    Wrist pronation    Wrist supination    Grip strength (lbs)    (Blank rows = not tested)  PALPATION:  Large gap from [facility] joint to humeral head, minimal deltoid and RC mms   TODAY'S TREATMENT:                                                                                                                                         DATE:  02/22/23 UBE level 4 x 5 minutes Ktape 3 I strips to support and help initiate motions for deltoid Supine 3#, 2# and 1# punches small ROM Supine 3#, 2# and 1# isometric circels Supine 3# and 2# serratus punches Supine 1#, 2# flexion small ROM Left sidelying ER with no weight very weak Left sidelying held in abduction isometric holds Left sidelying eccentric ER Rows  2# wall slides limiting the lower ROM 1# wall circles 15# right arm row on machine 15# lats pull downs 5# chest press  02/15/23: NuStep L5 x 6 min Supine for R serratus punches with 1# Supine R shoulder abd/add with therapist manual  resistance,to 90 degrees, Supine rhythmic stabilization with R arm adducted, maintaining elbow flexed to 90 degrees, perturbation provided by therapist Supine for rhythmic stabilization with arm adducted, elbow 90 degrees, holding yellow t band, therapist providing Er force to engage IR strength Supine with strap and horizontal abduction isometric contraction, with B shoulder flexion Supine with horizontal abd force B shoulders with chest press Side lying L for R shoulder Er with towel roll under R axilla 1 # Seated biceps curls, no wt 10x Shoulder Ext 5lb 2x10 Triceps Ext 25lb 2x10 Isometrics R shoulder abduction 5 sec holds, to isolate lateral delts   02/12/23 NuStep L5 x 6 min Rows & Lats 15lb 2x10  Shoulder Ext 5lb 2x10 Shoulder Flex & Abd AROM x10 Shoulder ER/IR 2x10 Triceps  Ext 25lb 2x10 Biceps Curls 2lb 2x10 Supine RUE AROM in all directions  02/01/23 UBE L1 x 3 min AAROM 1LB WaTE flex, Ext 2x10 RUE AAORM ABD dowel 2x10 Rows & Ext red 2x10 Triceps Ext 20lb 2x10 RUE Biceps Curls 1lb x10, x5 RUE IR yellow w2x10 RUE ER yellow x10 x5  RUE PROM Supine Rue ER/IR, Flex x10 AROM  PATIENT EDUCATION: Education details: HEP/POC Person educated: Patient Education method: Programmer, Multimedia, Facilities Manager, Actor cues, Verbal cues, and Handouts Education comprehension: verbalized understanding  HOME EXERCISE PROGRAM: Access Code: [email or web address] URL: [email or web address] Date: 01/22/2023 Prepared by: Ozell Mainland  Exercises - Seated Shoulder Flexion AAROM with Pulley Behind  - 1 x daily - 7 x weekly - 2 sets - 10 reps - 3 hold - Seated Shoulder Abduction AAROM with Pulley Behind  - 1 x daily - 7 x weekly - 2 sets - 10 reps - 3 hold - Standing Shoulder Internal Rotation AAROM with Pulley  - 1 x daily - 7 x weekly - 2 sets - 10 reps - 3 hold - Supine Shoulder Alphabet  - 1 x daily - 7 x weekly - 2 sets - 10 reps - 3 hold - Supine Shoulder Press  - 1 x daily - 7 x weekly - 2 sets - 10 reps - 3 hold - Supine Single Arm Scapular Protraction  - 1 x daily - 7 x weekly - 2 sets - 10 reps - 3 hold - Supine Shoulder External Rotation with Dumbbell  - 1 x daily - 7 x weekly - 2 sets - 10 reps - 3 hold  ASSESSMENT:  CLINICAL IMPRESSION: Patient is a 78 y.o. male who participated today in a skilled  physical therapy treatment for s/p right reverse total shoulder replacement.  Continued with RUE strengthening, periscapular, with rhythmic stabilization, and isometrics to isolate motor control R lateral deltoids especially. Still with significant postural compensation due to R shoulder weakness. I added Ktape to help deltoids to see , he has improved ROM with and without tape.  It however is still very limited  OBJECTIVE IMPAIRMENTS: decreased activity tolerance,  decreased endurance, decreased ROM, decreased strength, decreased safety awareness, increased edema, increased fascial restrictions, increased muscle spasms, impaired flexibility, improper body mechanics, postural dysfunction, and pain.   REHAB POTENTIAL: Good  CLINICAL DECISION MAKING: Evolving/moderate complexity  EVALUATION COMPLEXITY: Low   GOALS: Goals reviewed with patient? Yes  SHORT TERM GOALS: Target date: 02/05/23  Independent with initial HEP Goal status: Met 02/12/23  LONG TERM GOALS: Target date: 03/25/23  Independent with advanced HEP Goal status: INITIAL  2.  Increase AROM of the right shoulder to 80 degrees Goal status: progressing 02/22/23  3.  Increase ER of the right  shoulder to 50 degrees Goal status: progressing 02/22/23  4.  Able to reach shoulder height cabinet Goal status: progressing 02/22/23  5.  No pain Goal status:met 02/22/23  6.  Dress without difficultyu Goal status: INITIAL  PLAN:  PT FREQUENCY: 1x/week  PT DURATION: 12 weeks  PLANNED INTERVENTIONS: 97164- PT Re-evaluation, 97110-Therapeutic exercises, 97530- Therapeutic activity, 97112- Neuromuscular re-education, 97535- Self Care, [id number]- Manual therapy, 97014- Electrical stimulation (unattended), 97016- Vasopneumatic device, Patient/Family education, Taping, Cryotherapy, and Moist heat  PLAN FOR NEXT SESSION: work on functional motion and strength, would see how the kinesiotaping R shoulder did with the assist of the lateral deltoids due to large GH gap, biomechanical disadvantage.   OBADIAH OZELL ORN, PT, 02/22/2023, 1:54 PM

## 2023-02-25 ENCOUNTER — Encounter: Payer: Self-pay | Admitting: Medical

## 2023-02-26 ENCOUNTER — Other Ambulatory Visit: Payer: Self-pay | Admitting: Gastroenterology

## 2023-02-26 DIAGNOSIS — K7460 Unspecified cirrhosis of liver: Secondary | ICD-10-CM | POA: Diagnosis not present

## 2023-02-26 DIAGNOSIS — Z860100 Personal history of colon polyps, unspecified: Secondary | ICD-10-CM | POA: Diagnosis not present

## 2023-02-26 DIAGNOSIS — K824 Cholesterolosis of gallbladder: Secondary | ICD-10-CM | POA: Diagnosis not present

## 2023-02-26 DIAGNOSIS — K7689 Other specified diseases of liver: Secondary | ICD-10-CM | POA: Diagnosis not present

## 2023-03-01 ENCOUNTER — Encounter: Payer: Self-pay | Admitting: Physical Therapy

## 2023-03-01 ENCOUNTER — Ambulatory Visit: Payer: PPO | Admitting: Physical Therapy

## 2023-03-01 DIAGNOSIS — M25511 Pain in right shoulder: Secondary | ICD-10-CM

## 2023-03-01 DIAGNOSIS — M6281 Muscle weakness (generalized): Secondary | ICD-10-CM

## 2023-03-01 DIAGNOSIS — M25611 Stiffness of right shoulder, not elsewhere classified: Secondary | ICD-10-CM

## 2023-03-01 NOTE — Therapy (Signed)
 OUTPATIENT PHYSICAL THERAPY SHOULDER TREATMENT   Patient Name: Ruben Henry MRN: [id number] DOB:1945-04-09, 78 y.o., male Today's Date: 03/01/2023  END OF SESSION:  PT End of Session - 03/01/23 1357     Visit Number 6    Date for PT Re-Evaluation 03/25/23    Authorization Type [employer] Medicare    PT Start Time 1357    PT Stop Time 1443    PT Time Calculation (min) 46 min    Activity Tolerance Patient tolerated treatment well    Behavior During Therapy [facility] for tasks assessed/performed              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   CAD (coronary artery disease) 07/20/2013   s/p remote PCI of RCA and LCX in 1998   Hypercholesterolemia    Hypertension    Osteoarthritis of right hip    Prostate cancer [facility])    brachytherapy   Past Surgical History:  Procedure Laterality Date   CORONARY ANGIOPLASTY WITH STENT PLACEMENT  06/21/1996   prior angioplasty of the RCA with stenting of the LCX in 1998   EYE SURGERY  1969   Right eye /    TONSILLECTOMY AND ADENOIDECTOMY     Patient Active Problem List   Diagnosis Date Noted   Asthmatic bronchitis with acute exacerbation 12/05/2021   History of prostate cancer 05/09/2014   Benign essential hypertension 10/27/2013   CAD (coronary artery disease) 07/20/2013   Hypertension 01/20/2012   Hypercholesterolemia 01/20/2012    PCP: Saguier, PA  REFERRING PROVIDER: Kay, MD  REFERRING DIAG: S/P right reverse TSA  THERAPY DIAG:  Acute pain of right shoulder  Muscle weakness (generalized)  Stiffness of right shoulder, not elsewhere classified  Rationale for Evaluation and Treatment: Rehabilitation  ONSET DATE: 12/01/22  SUBJECTIVE:                                                                                                                                                                                       SUBJECTIVE STATEMENT: Saw MD, feels like we are making some progress, and that we need to continue Hand dominance: Right  PERTINENT HISTORY: See above  PAIN:  Are you having pain? Yes: NPRS scale: 0/10 Pain location: right shoulder Pain description: sore, ache  Aggravating factors: reaching up and out pain up to 6/10 Relieving factors: not using it, pain can be 0/10   PRECAUTIONS: Other: reverse TSA protocol  RED FLAGS: None   WEIGHT BEARING RESTRICTIONS: No  FALLS:  Has patient fallen in last 6  months? No  LIVING ENVIRONMENT: Lives with: lives with their family Lives in: House/apartment Stairs: Yes: Internal: 12 steps; can reach both Has following equipment at home: None  OCCUPATION: retired  PLOF: Independent and golf 3x/week  PATIENT GOALS:play golf again, have better motions and better stretngth  NEXT MD VISIT:   OBJECTIVE:  Note: Objective measures were completed at Evaluation unless otherwise noted.  DIAGNOSTIC FINDINGS:  None in chart, patient had recent x-rays at [facility] and looks good  PATIENT SURVEYS:  FOTO 31  COGNITION: Overall cognitive status: Within functional limits for tasks assessed     SENSATION: WFL  POSTURE: Fwd head, rounded shoulders  UPPER EXTREMITY ROM:   Active ROM Right AROM Eval Right PROM eval Right  AROM 02/22/23  Shoulder flexion 55 135 70  Shoulder extension     Shoulder abduction 20 120 40  Shoulder adduction     Shoulder internal rotation 30 40   Shoulder external rotation 15 30   Elbow flexion 110    Elbow extension 0    Wrist flexion     Wrist extension     Wrist ulnar deviation     Wrist radial deviation     Wrist pronation     Wrist supination     (Blank rows = not tested)  UPPER EXTREMITY MMT:  minimal serratus activity  MMT Right eval Left eval  Shoulder flexion 2   Shoulder extension    Shoulder abduction 2-   Shoulder adduction    Shoulder internal rotation 4-   Shoulder  external rotation 1+   Middle trapezius    Lower trapezius    Elbow flexion 3+   Elbow extension 4+   Wrist flexion    Wrist extension    Wrist ulnar deviation    Wrist radial deviation    Wrist pronation    Wrist supination    Grip strength (lbs)    (Blank rows = not tested)  PALPATION:  Large gap from [facility] joint to humeral head, minimal deltoid and RC mms   TODAY'S TREATMENT:                                                                                                                                         DATE:  03/01/23 Standing nustep right arm push and pull across body UBE level 4 x 4 minutes K-tape 3 I strips for upward position of the humerus Biceps 10# Triceps 25# Wall slides , circles and overhead side to side then with 2# Supine 3# punches small ROM, isometric circles and serratus Side lying 2# ER with assist, tried to do some isometric abduction but could not hold with any posterior mms 20# rows 20# lats 10# chest press Physioball chest pass 2.2# shot put Back to wall light ball overhead reach Some isometrics against wall  02/22/23 UBE level 4 x 5 minutes Ktape 3 I strips  to support and help initiate motions for deltoid Supine 3#, 2# and 1# punches small ROM Supine 3#, 2# and 1# isometric circels Supine 3# and 2# serratus punches Supine 1#, 2# flexion small ROM Left sidelying ER with no weight very weak Left sidelying held in abduction isometric holds Left sidelying eccentric ER Rows  2# wall slides limiting the lower ROM 1# wall circles 15# right arm row on machine 15# lats pull downs 5# chest press  02/15/23: NuStep L5 x 6 min Supine for R serratus punches with 1# Supine R shoulder abd/add with therapist manual resistance,to 90 degrees, Supine rhythmic stabilization with R arm adducted, maintaining elbow flexed to 90 degrees, perturbation provided by therapist Supine for rhythmic stabilization with arm adducted, elbow 90 degrees, holding yellow t  band, therapist providing Er force to engage IR strength Supine with strap and horizontal abduction isometric contraction, with B shoulder flexion Supine with horizontal abd force B shoulders with chest press Side lying L for R shoulder Er with towel roll under R axilla 1 # Seated biceps curls, no wt 10x Shoulder Ext 5lb 2x10 Triceps Ext 25lb 2x10 Isometrics R shoulder abduction 5 sec holds, to isolate lateral delts   02/12/23 NuStep L5 x 6 min Rows & Lats 15lb 2x10  Shoulder Ext 5lb 2x10 Shoulder Flex & Abd AROM x10 Shoulder ER/IR 2x10 Triceps Ext 25lb 2x10 Biceps Curls 2lb 2x10 Supine RUE AROM in all directions  02/01/23 UBE L1 x 3 min AAROM 1LB WaTE flex, Ext 2x10 RUE AAORM ABD dowel 2x10 Rows & Ext red 2x10 Triceps Ext 20lb 2x10 RUE Biceps Curls 1lb x10, x5 RUE IR yellow w2x10 RUE ER yellow x10 x5  RUE PROM Supine Rue ER/IR, Flex x10 AROM  PATIENT EDUCATION: Education details: HEP/POC Person educated: Patient Education method: Programmer, Multimedia, Facilities Manager, Actor cues, Verbal cues, and Handouts Education comprehension: verbalized understanding  HOME EXERCISE PROGRAM: Access Code: [email or web address] URL: [email or web address] Date: 01/22/2023 Prepared by: Ozell Mainland  Exercises - Seated Shoulder Flexion AAROM with Pulley Behind  - 1 x daily - 7 x weekly - 2 sets - 10 reps - 3 hold - Seated Shoulder Abduction AAROM with Pulley Behind  - 1 x daily - 7 x weekly - 2 sets - 10 reps - 3 hold - Standing Shoulder Internal Rotation AAROM with Pulley  - 1 x daily - 7 x weekly - 2 sets - 10 reps - 3 hold - Supine Shoulder Alphabet  - 1 x daily - 7 x weekly - 2 sets - 10 reps - 3 hold - Supine Shoulder Press  - 1 x daily - 7 x weekly - 2 sets - 10 reps - 3 hold - Supine Single Arm Scapular Protraction  - 1 x daily - 7 x weekly - 2 sets - 10 reps - 3 hold - Supine Shoulder External Rotation with Dumbbell  - 1 x daily - 7 x weekly - 2 sets - 10 reps - 3  hold  ASSESSMENT:  CLINICAL IMPRESSION: Patient is a 78 y.o. male who participated today in a skilled  physical therapy treatment for s/p right reverse total shoulder replacement.  Continued with RUE strengthening, periscapular, with rhythmic stabilization, and isometrics to isolate motor control R lateral deltoids especially. I dd the tape again for better humeral position.  He really lacks ER and deltoid mms, will continue to work on mms that he has to help with ROM and function  OBJECTIVE IMPAIRMENTS: decreased activity tolerance, decreased endurance, decreased ROM, decreased strength,  decreased safety awareness, increased edema, increased fascial restrictions, increased muscle spasms, impaired flexibility, improper body mechanics, postural dysfunction, and pain.   REHAB POTENTIAL: Good  CLINICAL DECISION MAKING: Evolving/moderate complexity  EVALUATION COMPLEXITY: Low   GOALS: Goals reviewed with patient? Yes  SHORT TERM GOALS: Target date: 02/05/23  Independent with initial HEP Goal status: Met 02/12/23  LONG TERM GOALS: Target date: 03/25/23  Independent with advanced HEP Goal status: progressing 03/01/23  2.  Increase AROM of the right shoulder to 80 degrees Goal status: progressing 02/22/23  3.  Increase ER of the right shoulder to 50 degrees Goal status: progressing 02/22/23  4.  Able to reach shoulder height cabinet Goal status: progressing 02/22/23  5.  No pain Goal status:met 02/22/23  6.  Dress without difficultyu Goal status: INITIAL  PLAN:  PT FREQUENCY: 1x/week  PT DURATION: 12 weeks  PLANNED INTERVENTIONS: 97164- PT Re-evaluation, 97110-Therapeutic exercises, 97530- Therapeutic activity, 97112- Neuromuscular re-education, 97535- Self Care, 02859- Manual therapy, 97014- Electrical stimulation (unattended), 97016- Vasopneumatic device, Patient/Family education, Taping, Cryotherapy, and Moist heat  PLAN FOR NEXT SESSION: work on functional motion and strength,  would see how the kinesiotaping R shoulder did with the assist of the lateral deltoids due to large GH gap, biomechanical disadvantage.   OBADIAH OZELL ORN, PT, 03/01/2023, 1:57 PM

## 2023-03-08 ENCOUNTER — Encounter: Payer: Self-pay | Admitting: Physical Therapy

## 2023-03-08 ENCOUNTER — Ambulatory Visit: Payer: PPO | Admitting: Physical Therapy

## 2023-03-08 DIAGNOSIS — M25511 Pain in right shoulder: Secondary | ICD-10-CM | POA: Diagnosis not present

## 2023-03-08 DIAGNOSIS — M25611 Stiffness of right shoulder, not elsewhere classified: Secondary | ICD-10-CM

## 2023-03-08 DIAGNOSIS — M6281 Muscle weakness (generalized): Secondary | ICD-10-CM

## 2023-03-08 NOTE — Therapy (Signed)
OUTPATIENT PHYSICAL THERAPY SHOULDER TREATMENT   Patient Name: Ruben Henry MRN: [id number] DOB:05/26/1945, 78 y.o., male Today's Date: 03/08/2023  END OF SESSION:  PT End of Session - 03/08/23 1351     Visit Number 7    Date for PT Re-Evaluation 03/25/23    Authorization Type [employer] Medicare    PT Start Time 1351    PT Stop Time 1439    PT Time Calculation (min) 48 min    Activity Tolerance Patient tolerated treatment well    Behavior During Therapy [facility] for tasks assessed/performed              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   CAD (coronary artery disease) 07/20/2013   s/p remote PCI of RCA and LCX in 1998   Hypercholesterolemia    Hypertension    Osteoarthritis of right hip    Prostate cancer [facility])    brachytherapy   Past Surgical History:  Procedure Laterality Date   CORONARY ANGIOPLASTY WITH STENT PLACEMENT  06/21/1996   prior angioplasty of the RCA with stenting of the LCX in 1998   EYE SURGERY  1969   Right eye /    TONSILLECTOMY AND ADENOIDECTOMY     Patient Active Problem List   Diagnosis Date Noted   Asthmatic bronchitis with acute exacerbation 12/05/2021   History of prostate cancer 05/09/2014   Benign essential hypertension 10/27/2013   CAD (coronary artery disease) 07/20/2013   Hypertension 01/20/2012   Hypercholesterolemia 01/20/2012    PCP: Saguier, PA  REFERRING PROVIDER: Ranell Patrick, MD  REFERRING DIAG: S/P right reverse TSA  THERAPY DIAG:  Acute pain of right shoulder  Muscle weakness (generalized)  Stiffness of right shoulder, not elsewhere classified  Rationale for Evaluation and Treatment: Rehabilitation  ONSET DATE: 12/01/22  SUBJECTIVE:                                                                                                                                                                                       SUBJECTIVE STATEMENT: Doing okay, no big problems Hand dominance: Right  PERTINENT HISTORY: See above  PAIN:  Are you having pain? Yes: NPRS scale: 0/10 Pain location: right shoulder Pain description: sore, ache  Aggravating factors: reaching up and out pain up to 6/10 Relieving factors: not using it, pain can be 0/10   PRECAUTIONS: Other: reverse TSA protocol  RED FLAGS: None   WEIGHT BEARING RESTRICTIONS: No  FALLS:  Has patient fallen in last 6 months? No  LIVING ENVIRONMENT: Lives with: lives with their  family Lives in: House/apartment Stairs: Yes: Internal: 12 steps; can reach both Has following equipment at home: None  OCCUPATION: retired  PLOF: Independent and golf 3x/week  PATIENT GOALS:play golf again, have better motions and better stretngth  NEXT MD VISIT:   OBJECTIVE:  Note: Objective measures were completed at Evaluation unless otherwise noted.  DIAGNOSTIC FINDINGS:  None in chart, patient had recent x-rays at [facility] and looks good  PATIENT SURVEYS:  FOTO 31  COGNITION: Overall cognitive status: Within functional limits for tasks assessed     SENSATION: WFL  POSTURE: Fwd head, rounded shoulders  UPPER EXTREMITY ROM:   Active ROM Right AROM Eval Right PROM eval Right  AROM 02/22/23  Shoulder flexion 55 135 70  Shoulder extension     Shoulder abduction 20 120 40  Shoulder adduction     Shoulder internal rotation 30 40   Shoulder external rotation 15 30   Elbow flexion 110    Elbow extension 0    Wrist flexion     Wrist extension     Wrist ulnar deviation     Wrist radial deviation     Wrist pronation     Wrist supination     (Blank rows = not tested)  UPPER EXTREMITY MMT:  minimal serratus activity  MMT Right eval Left eval  Shoulder flexion 2   Shoulder extension    Shoulder abduction 2-   Shoulder adduction    Shoulder internal rotation 4-   Shoulder external rotation 1+   Middle trapezius    Lower  trapezius    Elbow flexion 3+   Elbow extension 4+   Wrist flexion    Wrist extension    Wrist ulnar deviation    Wrist radial deviation    Wrist pronation    Wrist supination    Grip strength (lbs)    (Blank rows = not tested)  PALPATION:  Large gap from [facility] joint to humeral head, minimal deltoid and RC mms   TODAY'S TREATMENT:                                                                                                                                         DATE:  03/08/23 UBE Level 4 x 6 minutes Seated rows 25# Lats 25# 10# chest press 25# triceps 10# biceps  rolling ball up wall Ball vs wall Elbow on ball sitting small ER Supine: 3# serratus 3# isometric circles 2# and 3# punches in a small ROM 2# flexion in small ROM Side lying ER working eccentrics Trying abduction isometric holds Ktape 3i strips for support  03/01/23 Standing nustep right arm push and pull across body UBE level 4 x 4 minutes K-tape 3 I strips for upward position of the humerus Biceps 10# Triceps 25# Wall slides , circles and overhead side to side then with 2# Supine 3# punches small ROM, isometric circles and  serratus Side lying 2# ER with assist, tried to do some isometric abduction but could not hold with any posterior mms 20# rows 20# lats 10# chest press Physioball chest pass 2.2# shot put Back to wall light ball overhead reach Some isometrics against wall  02/22/23 UBE level 4 x 5 minutes Ktape 3 I strips to support and help initiate motions for deltoid Supine 3#, 2# and 1# punches small ROM Supine 3#, 2# and 1# isometric circels Supine 3# and 2# serratus punches Supine 1#, 2# flexion small ROM Left sidelying ER with no weight very weak Left sidelying held in abduction isometric holds Left sidelying eccentric ER Rows  2# wall slides limiting the lower ROM 1# wall circles 15# right arm row on machine 15# lats pull downs 5# chest press  02/15/23: NuStep L5 x 6 min Supine  for R serratus punches with 1# Supine R shoulder abd/add with therapist manual resistance,to 90 degrees, Supine rhythmic stabilization with R arm adducted, maintaining elbow flexed to 90 degrees, perturbation provided by therapist Supine for rhythmic stabilization with arm adducted, elbow 90 degrees, holding yellow t band, therapist providing Er force to engage IR strength Supine with strap and horizontal abduction isometric contraction, with B shoulder flexion Supine with horizontal abd force B shoulders with chest press Side lying L for R shoulder Er with towel roll under R axilla 1 # Seated biceps curls, no wt 10x Shoulder Ext 5lb 2x10 Triceps Ext 25lb 2x10 Isometrics R shoulder abduction 5 sec holds, to isolate lateral delts   02/12/23 NuStep L5 x 6 min Rows & Lats 15lb 2x10  Shoulder Ext 5lb 2x10 Shoulder Flex & Abd AROM x10 Shoulder ER/IR 2x10 Triceps Ext 25lb 2x10 Biceps Curls 2lb 2x10 Supine RUE AROM in all directions  02/01/23 UBE L1 x 3 min AAROM 1LB WaTE flex, Ext 2x10 RUE AAORM ABD dowel 2x10 Rows & Ext red 2x10 Triceps Ext 20lb 2x10 RUE Biceps Curls 1lb x10, x5 RUE IR yellow w2x10 RUE ER yellow x10 x5  RUE PROM Supine Rue ER/IR, Flex x10 AROM  PATIENT EDUCATION: Education details: HEP/POC Person educated: Patient Education method: Programmer, multimedia, Facilities manager, Actor cues, Verbal cues, and Handouts Education comprehension: verbalized understanding  HOME EXERCISE PROGRAM: Access Code: [email or web address] URL: [email or web address] Date: 01/22/2023 Prepared by: Stacie Glaze  Exercises - Seated Shoulder Flexion AAROM with Pulley Behind  - 1 x daily - 7 x weekly - 2 sets - 10 reps - 3 hold - Seated Shoulder Abduction AAROM with Pulley Behind  - 1 x daily - 7 x weekly - 2 sets - 10 reps - 3 hold - Standing Shoulder Internal Rotation AAROM with Pulley  - 1 x daily - 7 x weekly - 2 sets - 10 reps - 3 hold - Supine Shoulder Alphabet  - 1 x daily - 7 x  weekly - 2 sets - 10 reps - 3 hold - Supine Shoulder Press  - 1 x daily - 7 x weekly - 2 sets - 10 reps - 3 hold - Supine Single Arm Scapular Protraction  - 1 x daily - 7 x weekly - 2 sets - 10 reps - 3 hold - Supine Shoulder External Rotation with Dumbbell  - 1 x daily - 7 x weekly - 2 sets - 10 reps - 3 hold  ASSESSMENT:  CLINICAL IMPRESSION: Patient is a 78 y.o. male who participated today in a skilled  physical therapy treatment for s/p right reverse total shoulder replacement.  Continued with RUE strengthening, periscapular,  with rhythmic stabilization, and isometrics to isolate motor control R lateral deltoids especially. I dd the tape again for better humeral position.  I am seeing some improvements in his ability to hold ER in sidelying and to control eccentric ER and flexion .  OBJECTIVE IMPAIRMENTS: decreased activity tolerance, decreased endurance, decreased ROM, decreased strength, decreased safety awareness, increased edema, increased fascial restrictions, increased muscle spasms, impaired flexibility, improper body mechanics, postural dysfunction, and pain.   REHAB POTENTIAL: Good  CLINICAL DECISION MAKING: Evolving/moderate complexity  EVALUATION COMPLEXITY: Low   GOALS: Goals reviewed with patient? Yes  SHORT TERM GOALS: Target date: 02/05/23  Independent with initial HEP Goal status: Met 02/12/23  LONG TERM GOALS: Target date: 03/25/23  Independent with advanced HEP Goal status: progressing 03/01/23  2.  Increase AROM of the right shoulder to 80 degrees Goal status: progressing 02/22/23  3.  Increase ER of the right shoulder to 50 degrees Goal status: progressing 02/22/23  4.  Able to reach shoulder height cabinet Goal status: progressing 03/08/23/25  5.  No pain Goal status:met 02/22/23  6.  Dress without difficulty Goal status: progressing 03/08/23  PLAN:  PT FREQUENCY: 1x/week  PT DURATION: 12 weeks  PLANNED INTERVENTIONS: 97164- PT Re-evaluation,  97110-Therapeutic exercises, 97530- Therapeutic activity, 97112- Neuromuscular re-education, 97535- Self Care, 95638- Manual therapy, 97014- Electrical stimulation (unattended), 97016- Vasopneumatic device, Patient/Family education, Taping, Cryotherapy, and Moist heat  PLAN FOR NEXT SESSION: work on functional motion and strength, would see how the kinesiotaping R shoulder did with the assist of the lateral deltoids due to large GH gap, biomechanical disadvantage.   Jearld Lesch, PT, 03/08/2023, 1:52 PM

## 2023-03-09 MED ORDER — EZETIMIBE 10 MG PO TABS: 10.0000 mg | ORAL_TABLET | Freq: Every day | ORAL | 3 refills | Status: DC

## 2023-03-09 MED ORDER — LOSARTAN POTASSIUM 25 MG PO TABS: 25.0000 mg | ORAL_TABLET | Freq: Every day | ORAL | 3 refills | Status: DC

## 2023-03-09 NOTE — Addendum Note (Signed)
Addended by: Gwenevere Abbot on: 03/09/2023 04:45 PM   Modules accepted: Orders

## 2023-03-11 DIAGNOSIS — K7460 Unspecified cirrhosis of liver: Secondary | ICD-10-CM | POA: Diagnosis not present

## 2023-03-18 ENCOUNTER — Ambulatory Visit
Admission: RE | Admit: 2023-03-18 | Discharge: 2023-03-18 | Disposition: A | Discharge status: Home / Self Care | Payer: PPO | Source: Ambulatory Visit | Attending: Gastroenterology | Admitting: Gastroenterology

## 2023-03-18 DIAGNOSIS — K7460 Unspecified cirrhosis of liver: Secondary | ICD-10-CM

## 2023-03-18 MED ORDER — IOPAMIDOL (ISOVUE-300) INJECTION 61%
100.0000 mL | Freq: Once | INTRAVENOUS | Status: AC | PRN
  Administered 2023-03-18: 100 mL via INTRAVENOUS

## 2023-03-23 ENCOUNTER — Encounter: Payer: Self-pay | Admitting: Medical

## 2023-03-23 ENCOUNTER — Telehealth: Payer: Self-pay

## 2023-03-23 NOTE — Telephone Encounter (Signed)
 Copied from CRM [id number]. Topic: Clinical - Medication Question >> Mar 22, 2023  2:50 PM Ruben Henry wrote: Reason for CRM: Pt called requesting lab orders to be sent. Pt is having low blood sugar. Has and appt 3/5 would like for labs to be ordered prior to the visit.  Please call [phone]

## 2023-03-24 ENCOUNTER — Ambulatory Visit (INDEPENDENT_AMBULATORY_CARE_PROVIDER_SITE_OTHER): Payer: PPO | Admitting: Medical

## 2023-03-24 ENCOUNTER — Encounter: Payer: Self-pay | Admitting: Medical

## 2023-03-24 ENCOUNTER — Ambulatory Visit (HOSPITAL_BASED_OUTPATIENT_CLINIC_OR_DEPARTMENT_OTHER)
Admission: RE | Admit: 2023-03-24 | Discharge: 2023-03-24 | Disposition: A | Discharge status: Home / Self Care | Payer: PPO | Source: Ambulatory Visit | Attending: Medical | Admitting: Medical

## 2023-03-24 VITALS — BP 137/78 | HR 79 | Temp 97.6°F | Resp 16 | Ht 70.0 in | Wt 197.8 lb

## 2023-03-24 DIAGNOSIS — J4520 Mild intermittent asthma, uncomplicated: Secondary | ICD-10-CM

## 2023-03-24 DIAGNOSIS — R0981 Nasal congestion: Secondary | ICD-10-CM | POA: Diagnosis not present

## 2023-03-24 DIAGNOSIS — R5383 Other fatigue: Secondary | ICD-10-CM | POA: Diagnosis not present

## 2023-03-24 DIAGNOSIS — R059 Cough, unspecified: Secondary | ICD-10-CM

## 2023-03-24 DIAGNOSIS — D649 Anemia, unspecified: Secondary | ICD-10-CM | POA: Diagnosis not present

## 2023-03-24 DIAGNOSIS — R062 Wheezing: Secondary | ICD-10-CM

## 2023-03-24 DIAGNOSIS — Z96611 Presence of right artificial shoulder joint: Secondary | ICD-10-CM | POA: Diagnosis not present

## 2023-03-24 LAB — COMPREHENSIVE METABOLIC PANEL
ALT: 35 U/L (ref 0–53)
AST: 47 U/L — ABNORMAL HIGH (ref 0–37)
Albumin: 4.3 g/dL (ref 3.5–5.2)
Alkaline Phosphatase: 123 U/L — ABNORMAL HIGH (ref 39–117)
BUN: 12 mg/dL (ref 6–23)
CO2: 28 meq/L (ref 19–32)
Calcium: 9.2 mg/dL (ref 8.4–10.5)
Chloride: 101 meq/L (ref 96–112)
Creatinine, Ser: 0.75 mg/dL (ref 0.40–1.50)
GFR: 87.64 mL/min (ref >60.00)
Glucose, Bld: 95 mg/dL (ref 70–99)
Potassium: 4.5 meq/L (ref 3.5–5.1)
Sodium: 137 meq/L (ref 135–145)
Total Bilirubin: 1 mg/dL (ref 0.2–1.2)
Total Protein: 7.6 g/dL (ref 6.0–8.3)

## 2023-03-24 LAB — TSH: TSH: 7.07 u[IU]/mL — ABNORMAL HIGH (ref 0.35–5.50)

## 2023-03-24 LAB — CBC WITH DIFFERENTIAL/PLATELET
Basophils Absolute: 0.1 10*3/uL (ref 0.0–0.1)
Basophils Relative: 0.9 % (ref 0.0–3.0)
Eosinophils Absolute: 0.6 10*3/uL (ref 0.0–0.7)
Eosinophils Relative: 10.4 % — ABNORMAL HIGH (ref 0.0–5.0)
HCT: 42.2 % (ref 39.0–52.0)
Hemoglobin: 13.8 g/dL (ref 13.0–17.0)
Lymphocytes Relative: 13.2 % (ref 12.0–46.0)
Lymphs Abs: 0.8 10*3/uL (ref 0.7–4.0)
MCHC: 32.6 g/dL (ref 30.0–36.0)
MCV: 96 fL (ref 78.0–100.0)
Monocytes Absolute: 0.9 10*3/uL (ref 0.1–1.0)
Monocytes Relative: 16.2 % — ABNORMAL HIGH (ref 3.0–12.0)
Neutro Abs: 3.4 10*3/uL (ref 1.4–7.7)
Neutrophils Relative %: 59.3 % (ref 43.0–77.0)
Platelets: 209 10*3/uL (ref 150.0–400.0)
RBC: 4.4 Mil/uL (ref 4.22–5.81)
RDW: 17.6 % — ABNORMAL HIGH (ref 11.5–15.5)
WBC: 5.7 10*3/uL (ref 4.0–10.5)

## 2023-03-24 LAB — VITAMIN B12: Vitamin B-12: 384 pg/mL (ref 211–911)

## 2023-03-24 MED ORDER — METHYLPREDNISOLONE 4 MG PO TABS: ORAL_TABLET | ORAL | 0 refills | Status: DC

## 2023-03-24 MED ORDER — BENZONATATE 100 MG PO CAPS: 100.0000 mg | ORAL_CAPSULE | Freq: Three times a day (TID) | ORAL | 0 refills | Status: DC | PRN

## 2023-03-24 MED ORDER — AZITHROMYCIN 250 MG PO TABS: ORAL_TABLET | ORAL | 0 refills | Status: AC

## 2023-03-24 NOTE — Telephone Encounter (Signed)
 Patient was seen today.

## 2023-03-24 NOTE — Progress Notes (Signed)
 Subjective:    Patient ID: Ruben Henry, male    DOB: 11/03/1945, 78 y.o.   MRN: [id number]  HPI  Patient reports recent fatigue over the past week.  He first noticed it when he was playing golf earlier this week I think it was on Monday.  He describes that the fatigue was similar to when he was first diagnosed with anemia.  He states that in between holes he could rest for just 1 minute and then recover and then on the next he will start feeling fatigued again.  He did notice some wheezing on that day but he states that typically if he has fatigue associated with the wheezing/asthma he would need his inhaler to recover.  Patient is on Symbicort  2 inhalations twice daily.  Not using any albuterol  recently.  Patient is on iron  for anemia.  Not reporting any dark black appearing stools.  Over the last week he also noted some mild nasal congestion with intermittent cough during the day and a little bit of productive cough in the morning.  No fevers ,chills or sweats.  No body aches.  No sinus pressure reported.        Review of Systems  Constitutional:  Positive for fatigue. Negative for chills and fever.  HENT:  Positive for congestion.   Respiratory:  Positive for cough and wheezing. Negative for chest tightness and shortness of breath.   Cardiovascular:  Negative for chest pain and palpitations.  Gastrointestinal:  Negative for abdominal pain, constipation, nausea and vomiting.  Genitourinary:  Negative for dysuria and flank pain.  Musculoskeletal:  Negative for back pain and myalgias.  Neurological:  Negative for seizures, facial asymmetry and weakness.  Hematological:  Negative for adenopathy. Does not bruise/bleed easily.  Psychiatric/Behavioral:  Negative for behavioral problems and confusion.     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   CAD (coronary artery disease)  07/20/2013   s/p remote PCI of RCA and LCX in 1998   Hypercholesterolemia    Hypertension    Osteoarthritis of right hip    Prostate cancer [facility])    brachytherapy     Social History   Socioeconomic History   Marital status: Married    Spouse name: Not on file   Number of children: 2   Years of education: Not on file   Highest education level: Bachelor's degree (e.g., BA, AB, BS)  Occupational History   Occupation: Garment/textile Technologist: [employer]  Tobacco Use   Smoking status: Never   Smokeless tobacco: Never  Vaping Use   Vaping status: Never Used  Substance and Sexual Activity   Alcohol use: Yes    Alcohol/week: 14.0 standard drinks of alcohol    Types: 14 Cans of beer per week    Comment: couple of beers a day. Co[employer].   Drug use: No   Sexual activity: Yes  Other Topics Concern   Not on file  Social History Narrative   Not on file   Social Drivers of Health   Financial Resource Strain: Low Risk  (03/23/2023)   Overall Financial Resource Strain (CARDIA)    Difficulty of Paying Living Expenses: Not hard at all  Food Insecurity: No Food Insecurity (03/23/2023)   Hunger Vital Sign    Worried About Running Out of Food in the Last Year: Never true    Ran  Out of Food in the Last Year: Never true  Transportation Needs: No Transportation Needs (03/23/2023)   PRAPARE - Administrator, Civil Service (Medical): No    Lack of Transportation (Non-Medical): No  Physical Activity: Sufficiently Active (03/23/2023)   Exercise Vital Sign    Days of Exercise per Week: 4 days    Minutes of Exercise per Session: 60 min  Stress: No Stress Concern Present (03/23/2023)   [employer] of Occupational Health - Occupational Stress Questionnaire    Feeling of Stress : Only a little  Social Connections: Unknown (03/23/2023)   Social Connection and Isolation Panel [NHANES]    Frequency of Communication with Friends and Family: Once a week    Frequency of Social  Gatherings with Friends and Family: Patient declined    Attends Religious Services: Patient declined    Database Administrator or Organizations: Yes    Attends Banker Meetings: Patient declined    Marital Status: Married  Catering Manager Violence: Not At Risk (05/28/2022)   Humiliation, Afraid, Rape, and Kick questionnaire    Fear of Current or Ex-Partner: No    Emotionally Abused: No    Physically Abused: No    Sexually Abused: No    Past Surgical History:  Procedure Laterality Date   CORONARY ANGIOPLASTY WITH STENT PLACEMENT  06/21/1996   prior angioplasty of the RCA with stenting of the LCX in 1998   EYE SURGERY  1969   Right eye /    TONSILLECTOMY AND ADENOIDECTOMY      Family History  Problem Relation Age of Onset   Heart failure Mother    Esophageal cancer Father        died age 60, exp to asbestos   Cancer Father    Diabetes Sister    Allergies Sister    Heart attack Maternal Uncle 67   Coronary artery disease Maternal Uncle 52       with CABG    No Known Allergies  Current Outpatient Medications on File Prior to Visit  Medication Sig Dispense Refill   albuterol  (VENTOLIN  HFA) 108 (90 Base) MCG/ACT inhaler USE 2 INHALATIONS BY MOUTH EVERY 6 HOURS AS NEEDED FOR WHEEZING  OR SHORTNESS OF BREATH 34 g 2   aspirin 81 MG tablet Take 81 mg by mouth every other day.      azelastine  (ASTELIN ) 0.1 % nasal spray Place 1 spray into both nostrils 2 (two) times daily. Use in each nostril as directed 30 mL 5   benzonatate  (TESSALON ) 100 MG capsule Take 1 capsule (100 mg total) by mouth 3 (three) times daily as needed for cough. 30 capsule 0   budesonide -formoterol  (SYMBICORT ) 160-4.5 MCG/ACT inhaler Inhale 2 puffs into the lungs 2 (two) times daily. 1 each 11   ezetimibe  (ZETIA ) 10 MG tablet Take 1 tablet (10 mg total) by mouth daily. 90 tablet 3   fluticasone  (FLONASE ) 50 MCG/ACT nasal spray Place 2 sprays into both nostrils daily. (Patient taking differently:  Place 2 sprays into both nostrils daily as needed.) 16 g 2   ipratropium (ATROVENT) 0.02 % nebulizer solution Take 0.5 mg by nebulization every 4 (four) hours as needed for wheezing or shortness of breath.     Iron , Ferrous Sulfate , 325 (65 Fe) MG TABS 1 tab po bid 60 tablet 1   levalbuterol (XOPENEX) 0.63 MG/3ML nebulizer solution Take 0.63 mg by nebulization every 4 (four) hours as needed for wheezing or shortness of breath.  losartan  (COZAAR ) 25 MG tablet Take 1 tablet (25 mg total) by mouth daily. 90 tablet 3   methylPREDNISolone  (MEDROL ) 4 MG tablet Standard 6 day taper 21 tablet 0   Omega-3 Fatty Acids (OMEGA 3 PO) Take 1 capsule by mouth daily.     senna (SENOKOT) 8.6 MG tablet Take 1 tablet by mouth every other day.     No current facility-administered medications on file prior to visit.    BP 137/78 Comment: recheck [facility]  Pulse 79   Temp 97.6 F (36.4 C) (Oral)   Resp 16   Ht 5' 10 (1.778 m)   Wt 197 lb 12.8 oz (89.7 kg)   SpO2 98%   BMI 28.38 kg/m          Objective:   Physical Exam  General Mental Status- Alert. General Appearance- Not in acute distress.   Skin General: Color- Normal Color. Moisture- Normal Moisture.  Neck Carotid Arteries- Normal color. Moisture- Normal Moisture. No carotid bruits. No JVD.  Chest and Lung Exam Auscultation: Breath Sounds:-Normal.  Cardiovascular Auscultation:Rythm- Regular. Murmurs & Other Heart Sounds:Auscultation of the heart reveals- No Murmurs.  Abdomen Inspection:-Inspeection Normal. Palpation/Percussion:Note:No mass. Palpation and Percussion of the abdomen reveal- Non Tender, Non Distended + BS, no rebound or guarding.   Neurologic Cranial Nerve exam:- CN III-XII intact(No nystagmus), symmetric smile. Strength:- 5/5 equal and symmetric strength both upper and lower extremities.       Assessment & Plan:   Patient Instructions  Recent intermittent fatigue with hx of anemia and described wheezing that  been mild.  You suspect probable anemia that is recurrent.  Could be the case but on your physical exam lungs do have some bilateral rough breath sounds though no wheezing heard.  In addition recent upper respiratory infection signs and symptoms.  Will get CBC, iron  panel, TSH, CMP and B12 level  Ordered stat chest x-ray.  Presently prescribing benzonatate  for cough and recommend that you use Flonase  nasal spray for nasal congestion.  After reviewing all labs and chest x-ray we will decide on potential prescription of Medrol  6-day taper dose along with azithromycin .  Will update you when labs and imaging studies are back.  -Will decide if we need to make any treatment changes or your prior anemia with a low iron .  Follow-up date to be determined after lab review   Dallas Maxwell, PA-C

## 2023-03-24 NOTE — Patient Instructions (Addendum)
 Recent intermittent fatigue with hx of anemia and described wheezing that been mild.  You suspect probable anemia that is recurrent.  Could be the case but on your physical exam lungs do have some bilateral rough breath sounds though no wheezing heard.  In addition recent upper respiratory infection signs and symptoms.  Will get CBC, iron  panel, TSH, CMP and B12 level  Ordered stat chest x-ray.  Presently prescribing benzonatate  for cough and recommend that you use Flonase  nasal spray for nasal congestion.  After reviewing all labs and chest x-ray we will decide on potential prescription of Medrol  6-day taper dose along with azithromycin .  Will update you when labs and imaging studies are back.  -Will decide if we need to make any treatment changes or your prior anemia with a low iron .  Follow-up date to be determined after lab review

## 2023-03-24 NOTE — Addendum Note (Signed)
 Addended by: Serafina Damme on: 03/24/2023 10:49 PM   Modules accepted: Orders

## 2023-03-25 ENCOUNTER — Encounter: Payer: Self-pay | Admitting: Medical

## 2023-03-25 LAB — IRON,TIBC AND FERRITIN PANEL
%SAT: 12 % — ABNORMAL LOW (ref 20–48)
Ferritin: 30 ng/mL (ref 24–380)
Iron: 61 ug/dL (ref 50–180)
TIBC: 513 ug/dL — ABNORMAL HIGH (ref 250–425)

## 2023-04-10 ENCOUNTER — Encounter: Payer: Self-pay | Admitting: Internal Medicine

## 2023-04-11 NOTE — Telephone Encounter (Signed)
 Please see mychart message sent by pt and advise.

## 2023-04-12 NOTE — Telephone Encounter (Signed)
 Please schedule f/u with APP. Thank you!

## 2023-04-19 DIAGNOSIS — L570 Actinic keratosis: Secondary | ICD-10-CM | POA: Diagnosis not present

## 2023-04-19 DIAGNOSIS — M71341 Other bursal cyst, right hand: Secondary | ICD-10-CM | POA: Diagnosis not present

## 2023-04-19 DIAGNOSIS — Z859 Personal history of malignant neoplasm, unspecified: Secondary | ICD-10-CM | POA: Diagnosis not present

## 2023-04-19 DIAGNOSIS — Z85828 Personal history of other malignant neoplasm of skin: Secondary | ICD-10-CM | POA: Diagnosis not present

## 2023-04-19 DIAGNOSIS — L821 Other seborrheic keratosis: Secondary | ICD-10-CM | POA: Diagnosis not present

## 2023-04-19 DIAGNOSIS — Z129 Encounter for screening for malignant neoplasm, site unspecified: Secondary | ICD-10-CM | POA: Diagnosis not present

## 2023-04-19 MED ORDER — TRELEGY ELLIPTA 100-62.5-25 MCG/ACT IN AEPB: 1.0000 | INHALATION_SPRAY | Freq: Every day | RESPIRATORY_TRACT | 3 refills | Status: DC

## 2023-04-27 DIAGNOSIS — I10 Essential (primary) hypertension: Secondary | ICD-10-CM | POA: Diagnosis not present

## 2023-04-27 DIAGNOSIS — Z6828 Body mass index (BMI) 28.0-28.9, adult: Secondary | ICD-10-CM | POA: Diagnosis not present

## 2023-04-27 DIAGNOSIS — N390 Urinary tract infection, site not specified: Secondary | ICD-10-CM | POA: Diagnosis not present

## 2023-05-31 ENCOUNTER — Other Ambulatory Visit (INDEPENDENT_AMBULATORY_CARE_PROVIDER_SITE_OTHER)

## 2023-05-31 DIAGNOSIS — D649 Anemia, unspecified: Secondary | ICD-10-CM

## 2023-06-01 ENCOUNTER — Encounter: Payer: Self-pay | Admitting: Medical

## 2023-06-01 DIAGNOSIS — Z4789 Encounter for other orthopedic aftercare: Secondary | ICD-10-CM | POA: Diagnosis not present

## 2023-06-01 LAB — IRON,TIBC AND FERRITIN PANEL
%SAT: 60 % — ABNORMAL HIGH (ref 20–48)
Ferritin: 18 ng/mL — ABNORMAL LOW (ref 24–380)
Iron: 291 ug/dL — ABNORMAL HIGH (ref 50–180)
TIBC: 489 ug/dL — ABNORMAL HIGH (ref 250–425)

## 2023-06-01 NOTE — Addendum Note (Signed)
 Addended by: Serafina Damme on: 06/01/2023 05:16 PM   Modules accepted: Orders

## 2023-06-01 NOTE — Addendum Note (Signed)
 Addended by: Serafina Damme on: 06/01/2023 01:01 PM   Modules accepted: Orders

## 2023-06-02 ENCOUNTER — Other Ambulatory Visit: Payer: Self-pay

## 2023-06-02 ENCOUNTER — Ambulatory Visit (HOSPITAL_BASED_OUTPATIENT_CLINIC_OR_DEPARTMENT_OTHER)
Admission: RE | Admit: 2023-06-02 | Discharge: 2023-06-02 | Disposition: A | Discharge status: Home / Self Care | Source: Ambulatory Visit | Attending: Medical | Admitting: Medical

## 2023-06-02 ENCOUNTER — Ambulatory Visit: Admitting: Medical

## 2023-06-02 ENCOUNTER — Encounter (HOSPITAL_BASED_OUTPATIENT_CLINIC_OR_DEPARTMENT_OTHER): Payer: Self-pay | Admitting: Emergency Medicine

## 2023-06-02 ENCOUNTER — Encounter: Payer: Self-pay | Admitting: Medical

## 2023-06-02 ENCOUNTER — Inpatient Hospital Stay (HOSPITAL_BASED_OUTPATIENT_CLINIC_OR_DEPARTMENT_OTHER)
Admission: EM | Admit: 2023-06-02 | Discharge: 2023-06-04 | DRG: 433 | Disposition: A | Discharge status: Home / Self Care | Attending: Internal Medicine | Admitting: Internal Medicine

## 2023-06-02 ENCOUNTER — Telehealth: Payer: Self-pay

## 2023-06-02 VITALS — BP 120/62 | HR 86 | Resp 18 | Ht 70.0 in | Wt 197.8 lb

## 2023-06-02 DIAGNOSIS — K2900 Acute gastritis without bleeding: Secondary | ICD-10-CM | POA: Diagnosis present

## 2023-06-02 DIAGNOSIS — D509 Iron deficiency anemia, unspecified: Secondary | ICD-10-CM

## 2023-06-02 DIAGNOSIS — Z96611 Presence of right artificial shoulder joint: Secondary | ICD-10-CM | POA: Diagnosis not present

## 2023-06-02 DIAGNOSIS — Z79899 Other long term (current) drug therapy: Secondary | ICD-10-CM | POA: Diagnosis not present

## 2023-06-02 DIAGNOSIS — K3189 Other diseases of stomach and duodenum: Secondary | ICD-10-CM | POA: Diagnosis not present

## 2023-06-02 DIAGNOSIS — Z8546 Personal history of malignant neoplasm of prostate: Secondary | ICD-10-CM | POA: Diagnosis not present

## 2023-06-02 DIAGNOSIS — R790 Abnormal level of blood mineral: Secondary | ICD-10-CM

## 2023-06-02 DIAGNOSIS — Z862 Personal history of diseases of the blood and blood-forming organs and certain disorders involving the immune mechanism: Secondary | ICD-10-CM | POA: Diagnosis not present

## 2023-06-02 DIAGNOSIS — I8500 Esophageal varices without bleeding: Secondary | ICD-10-CM | POA: Diagnosis not present

## 2023-06-02 DIAGNOSIS — R5383 Other fatigue: Secondary | ICD-10-CM

## 2023-06-02 DIAGNOSIS — I8510 Secondary esophageal varices without bleeding: Secondary | ICD-10-CM | POA: Diagnosis present

## 2023-06-02 DIAGNOSIS — K921 Melena: Secondary | ICD-10-CM | POA: Diagnosis not present

## 2023-06-02 DIAGNOSIS — Z8249 Family history of ischemic heart disease and other diseases of the circulatory system: Secondary | ICD-10-CM

## 2023-06-02 DIAGNOSIS — R062 Wheezing: Secondary | ICD-10-CM

## 2023-06-02 DIAGNOSIS — M1611 Unilateral primary osteoarthritis, right hip: Secondary | ICD-10-CM | POA: Diagnosis not present

## 2023-06-02 DIAGNOSIS — K7460 Unspecified cirrhosis of liver: Principal | ICD-10-CM | POA: Diagnosis present

## 2023-06-02 DIAGNOSIS — D649 Anemia, unspecified: Principal | ICD-10-CM

## 2023-06-02 DIAGNOSIS — H5461 Unqualified visual loss, right eye, normal vision left eye: Secondary | ICD-10-CM | POA: Diagnosis not present

## 2023-06-02 DIAGNOSIS — K922 Gastrointestinal hemorrhage, unspecified: Secondary | ICD-10-CM | POA: Diagnosis present

## 2023-06-02 DIAGNOSIS — Z955 Presence of coronary angioplasty implant and graft: Secondary | ICD-10-CM

## 2023-06-02 DIAGNOSIS — I2510 Atherosclerotic heart disease of native coronary artery without angina pectoris: Secondary | ICD-10-CM | POA: Diagnosis present

## 2023-06-02 DIAGNOSIS — J45909 Unspecified asthma, uncomplicated: Secondary | ICD-10-CM | POA: Diagnosis not present

## 2023-06-02 DIAGNOSIS — Z7982 Long term (current) use of aspirin: Secondary | ICD-10-CM | POA: Diagnosis not present

## 2023-06-02 DIAGNOSIS — E7800 Pure hypercholesterolemia, unspecified: Secondary | ICD-10-CM | POA: Diagnosis present

## 2023-06-02 DIAGNOSIS — R059 Cough, unspecified: Secondary | ICD-10-CM

## 2023-06-02 DIAGNOSIS — D62 Acute posthemorrhagic anemia: Secondary | ICD-10-CM | POA: Diagnosis not present

## 2023-06-02 DIAGNOSIS — I10 Essential (primary) hypertension: Secondary | ICD-10-CM | POA: Diagnosis present

## 2023-06-02 HISTORY — DX: Anemia, unspecified: D64.9

## 2023-06-02 LAB — BASIC METABOLIC PANEL WITH GFR
Anion gap: 6 (ref 5–15)
BUN: 14 mg/dL (ref 8–23)
CO2: 25 mmol/L (ref 22–32)
Calcium: 8.4 mg/dL — ABNORMAL LOW (ref 8.9–10.3)
Chloride: 105 mmol/L (ref 98–111)
Creatinine, Ser: 0.79 mg/dL (ref 0.61–1.24)
GFR, Estimated: >60 mL/min (ref >60)
Glucose, Bld: 137 mg/dL — ABNORMAL HIGH (ref 70–99)
Potassium: 3.8 mmol/L (ref 3.5–5.1)
Sodium: 136 mmol/L (ref 135–145)

## 2023-06-02 LAB — CBC WITH DIFFERENTIAL/PLATELET
Abs Immature Granulocytes: 0.03 10*3/uL (ref 0.00–0.07)
Basophils Absolute: 0.1 10*3/uL (ref 0.0–0.1)
Basophils Absolute: 0.1 10*3/uL (ref 0.0–0.1)
Basophils Relative: 0.9 % (ref 0.0–3.0)
Basophils Relative: 1 %
Eosinophils Absolute: 0.6 10*3/uL (ref 0.0–0.7)
Eosinophils Absolute: 0.5 10*3/uL (ref 0.0–0.5)
Eosinophils Relative: 8.7 % — ABNORMAL HIGH (ref 0.0–5.0)
Eosinophils Relative: 7 %
HCT: 23.3 % — CL (ref 39.0–52.0)
HCT: 21 % — ABNORMAL LOW (ref 39.0–52.0)
Hemoglobin: 7.7 g/dL — CL (ref 13.0–17.0)
Hemoglobin: 6.9 g/dL — CL (ref 13.0–17.0)
Immature Granulocytes: 0 %
Lymphocytes Relative: 20.3 % (ref 12.0–46.0)
Lymphocytes Relative: 20 %
Lymphs Abs: 1.4 10*3/uL (ref 0.7–4.0)
Lymphs Abs: 1.4 10*3/uL (ref 0.7–4.0)
MCH: 33.2 pg (ref 26.0–34.0)
MCHC: 33.2 g/dL (ref 30.0–36.0)
MCHC: 32.9 g/dL (ref 30.0–36.0)
MCV: 103.2 fl — ABNORMAL HIGH (ref 78.0–100.0)
MCV: 101 fL — ABNORMAL HIGH (ref 80.0–100.0)
Monocytes Absolute: 1.2 10*3/uL — ABNORMAL HIGH (ref 0.1–1.0)
Monocytes Absolute: 1.1 10*3/uL — ABNORMAL HIGH (ref 0.1–1.0)
Monocytes Relative: 18.3 % — ABNORMAL HIGH (ref 3.0–12.0)
Monocytes Relative: 16 %
Neutro Abs: 3.5 10*3/uL (ref 1.4–7.7)
Neutro Abs: 3.8 10*3/uL (ref 1.7–7.7)
Neutrophils Relative %: 51.8 % (ref 43.0–77.0)
Neutrophils Relative %: 56 %
Platelets: 270 10*3/uL (ref 150.0–400.0)
Platelets: 251 10*3/uL (ref 150–400)
RBC: 2.25 Mil/uL — ABNORMAL LOW (ref 4.22–5.81)
RBC: 2.08 MIL/uL — ABNORMAL LOW (ref 4.22–5.81)
RDW: 18.7 % — ABNORMAL HIGH (ref 11.5–15.5)
RDW: 18.3 % — ABNORMAL HIGH (ref 11.5–15.5)
WBC: 6.8 10*3/uL (ref 4.0–10.5)
WBC: 6.8 10*3/uL (ref 4.0–10.5)
nRBC: 0.3 % — ABNORMAL HIGH (ref 0.0–0.2)

## 2023-06-02 LAB — OCCULT BLOOD X 1 CARD TO LAB, STOOL: Fecal Occult Bld: NEGATIVE

## 2023-06-02 LAB — CBC
HCT: 25 % — ABNORMAL LOW (ref 39.0–52.0)
Hemoglobin: 7.9 g/dL — ABNORMAL LOW (ref 13.0–17.0)
MCH: 33.6 pg (ref 26.0–34.0)
MCHC: 31.6 g/dL (ref 30.0–36.0)
MCV: 106.4 fL — ABNORMAL HIGH (ref 80.0–100.0)
Platelets: 267 10*3/uL (ref 150–400)
RBC: 2.35 MIL/uL — ABNORMAL LOW (ref 4.22–5.81)
RDW: 18.4 % — ABNORMAL HIGH (ref 11.5–15.5)
WBC: 6.4 10*3/uL (ref 4.0–10.5)
nRBC: 0 % (ref 0.0–0.2)

## 2023-06-02 LAB — COMPREHENSIVE METABOLIC PANEL WITH GFR
ALT: 37 U/L (ref 0–44)
AST: 47 U/L — ABNORMAL HIGH (ref 15–41)
Albumin: 3 g/dL — ABNORMAL LOW (ref 3.5–5.0)
Alkaline Phosphatase: 115 U/L (ref 38–126)
Anion gap: 8 (ref 5–15)
BUN: 14 mg/dL (ref 8–23)
CO2: 21 mmol/L — ABNORMAL LOW (ref 22–32)
Calcium: 8.3 mg/dL — ABNORMAL LOW (ref 8.9–10.3)
Chloride: 105 mmol/L (ref 98–111)
Creatinine, Ser: 0.78 mg/dL (ref 0.61–1.24)
GFR, Estimated: >60 mL/min (ref >60)
Glucose, Bld: 113 mg/dL — ABNORMAL HIGH (ref 70–99)
Potassium: 3.7 mmol/L (ref 3.5–5.1)
Sodium: 134 mmol/L — ABNORMAL LOW (ref 135–145)
Total Bilirubin: <0.2 mg/dL (ref 0.0–1.2)
Total Protein: 6.2 g/dL — ABNORMAL LOW (ref 6.5–8.1)

## 2023-06-02 LAB — PROTIME-INR
INR: 1.1 (ref 0.8–1.2)
Prothrombin Time: 14.6 s (ref 11.4–15.2)

## 2023-06-02 MED ORDER — ONDANSETRON HCL 4 MG PO TABS: 4.0000 mg | ORAL_TABLET | Freq: Four times a day (QID) | ORAL | Status: DC | PRN

## 2023-06-02 MED ORDER — ONDANSETRON HCL 4 MG/2ML IJ SOLN: 4.0000 mg | Freq: Four times a day (QID) | INTRAMUSCULAR | Status: DC | PRN

## 2023-06-02 MED ORDER — ACETAMINOPHEN 650 MG RE SUPP: 650.0000 mg | Freq: Four times a day (QID) | RECTAL | Status: DC | PRN

## 2023-06-02 MED ORDER — LOSARTAN POTASSIUM 25 MG PO TABS
25.0000 mg | ORAL_TABLET | Freq: Every day | ORAL | Status: DC
  Administered 2023-06-03 – 2023-06-04 (×2): 25 mg via ORAL
  Filled 2023-06-02 (×2): qty 1

## 2023-06-02 MED ORDER — ACETAMINOPHEN 325 MG PO TABS: 650.0000 mg | ORAL_TABLET | Freq: Four times a day (QID) | ORAL | Status: DC | PRN

## 2023-06-02 MED ORDER — SODIUM CHLORIDE 0.9 % IV SOLN
50.0000 ug/h | INTRAVENOUS | Status: DC
  Administered 2023-06-02 – 2023-06-03 (×2): 50 ug/h via INTRAVENOUS
  Filled 2023-06-02 (×3): qty 5
  Filled 2023-06-02: qty 1

## 2023-06-02 MED ORDER — BUDESON-GLYCOPYRROL-FORMOTEROL 160-9-4.8 MCG/ACT IN AERO
2.0000 | INHALATION_SPRAY | Freq: Two times a day (BID) | RESPIRATORY_TRACT | Status: DC
  Administered 2023-06-02 – 2023-06-04 (×4): 2 via RESPIRATORY_TRACT
  Filled 2023-06-02: qty 5.9

## 2023-06-02 MED ORDER — EZETIMIBE 10 MG PO TABS
10.0000 mg | ORAL_TABLET | Freq: Every day | ORAL | Status: DC
  Administered 2023-06-02 – 2023-06-04 (×3): 10 mg via ORAL
  Filled 2023-06-02 (×3): qty 1

## 2023-06-02 MED ORDER — SODIUM CHLORIDE 0.9 % IV SOLN
2.0000 g | Freq: Once | INTRAVENOUS | Status: AC
  Administered 2023-06-02: 2 g via INTRAVENOUS
  Filled 2023-06-02: qty 20

## 2023-06-02 MED ORDER — SODIUM CHLORIDE 0.9 % IV SOLN
1.0000 g | Freq: Once | INTRAVENOUS | Status: DC
  Filled 2023-06-02: qty 10

## 2023-06-02 MED ORDER — PANTOPRAZOLE SODIUM 40 MG IV SOLR
40.0000 mg | Freq: Two times a day (BID) | INTRAVENOUS | Status: DC
  Administered 2023-06-02 – 2023-06-04 (×5): 40 mg via INTRAVENOUS
  Filled 2023-06-02 (×5): qty 10

## 2023-06-02 NOTE — ED Provider Notes (Signed)
 [place] [facility] Provider Note   CSN: [id number] Arrival date & time: 06/02/23  1334     History  Chief Complaint  Patient presents with   GI Bleeding    Ruben Henry is a 78 y.o. male with cirrhosis, history of GI bleed from ulcer follows with [facility]en [facility] with [facility], presents at request of his PCP due to a low hemoglobin at 7.7 found on his labs today.  He does report black stools for the past 2 weeks.  Denies any bright red blood in his stools.  He reports feeling lightheaded and fatigued, no shortness of breath.  He denies taking any blood thinners, also denies taking a baby aspirin.  Denies any NSAID or alcohol use.  Patient reports a positive Hemoccult at his PCP.    HPI     Home Medications Prior to Admission medications   Medication Sig Start Date End Date Taking? Authorizing Provider  albuterol (VENTOLIN HFA) 108 (90 Base) MCG/ACT inhaler USE 2 INHALATIONS BY MOUTH EVERY 6 HOURS AS NEEDED FOR WHEEZING  OR SHORTNESS OF BREATH 01/25/23   Saguier, Gaylin Ke, PA-C  aspirin 81 MG tablet Take 81 mg by mouth every other day.     [provider]  azelastine (ASTELIN) 0.1 % nasal spray Place 1 spray into both nostrils 2 (two) times daily. Use in each nostril as directed 10/14/22   Aleck Hurdle, MD  ezetimibe (ZETIA) 10 MG tablet Take 1 tablet (10 mg total) by mouth daily. 03/09/23   Saguier, Gaylin Ke, PA-C  fluticasone (FLONASE) 50 MCG/ACT nasal spray Place 2 sprays into both nostrils daily. Patient taking differently: Place 2 sprays into both nostrils daily as needed. 12/20/20   Saguier, Gaylin Ke, PA-C  Fluticasone-Umeclidin-Vilant (TRELEGY ELLIPTA) 100-62.5-25 MCG/ACT AEPB Inhale 1 puff into the lungs daily. 04/19/23   Desai, Nikita S, MD  ipratropium (ATROVENT) 0.02 % nebulizer solution Take 0.5 mg by nebulization every 4 (four) hours as needed for wheezing or shortness of breath.    [provider]  Iron, Ferrous Sulfate, 325  (65 Fe) MG TABS 1 tab po bid 01/21/23   Saguier, Gaylin Ke, PA-C  levalbuterol (XOPENEX) 0.63 MG/3ML nebulizer solution Take 0.63 mg by nebulization every 4 (four) hours as needed for wheezing or shortness of breath.    [provider]  losartan (COZAAR) 25 MG tablet Take 1 tablet (25 mg total) by mouth daily. 03/09/23   Saguier, Gaylin Ke, PA-C  methylPREDNISolone (MEDROL) 4 MG tablet Standard 6 day taper 01/20/23   Saguier, Gaylin Ke, PA-C  methylPREDNISolone (MEDROL) 4 MG tablet Standard 6 day dose pack 03/24/23   Saguier, Gaylin Ke, PA-C  Omega-3 Fatty Acids (OMEGA 3 PO) Take 1 capsule by mouth daily.    [provider]  senna (SENOKOT) 8.6 MG tablet Take 1 tablet by mouth every other day.    [provider]      Allergies    Patient has no known allergies.    Review of Systems   Review of Systems  Constitutional:  Positive for fatigue.    Physical Exam Updated Vital Signs BP (!) 150/68 (BP Location: Right Arm)   Pulse 92   Temp 98.3 F (36.8 C) (Oral)   Resp 15   Ht 5\' 10"  (1.778 m)   Wt 86.2 kg   SpO2 98%   BMI 27.26 kg/m  Physical Exam Vitals and nursing note reviewed. Exam conducted with a chaperone present.  Constitutional:      Appearance:  Normal appearance.  HENT:     Head: Atraumatic.  Cardiovascular:     Rate and Rhythm: Normal rate and regular rhythm.  Pulmonary:     Effort: Pulmonary effort is normal.  Abdominal:     General: Abdomen is flat.     Palpations: Abdomen is soft.     Tenderness: There is no abdominal tenderness.  Genitourinary:    Comments: Ruben Henry present for rectal exam  Large external nonthrombosed hemorrhoid.  No active rectal bleeding.  Attempted to obtain stool sample, although was not successful.  Neurological:     General: No focal deficit present.     Mental Status: He is alert.  Psychiatric:        Mood and Affect: Mood normal.        Behavior: Behavior normal.     ED Results / Procedures / Treatments    Labs (all labs ordered are listed, but only abnormal results are displayed) Labs Reviewed  CBC WITH DIFFERENTIAL/PLATELET - Abnormal; Notable for the following components:      Result Value   RBC 2.08 (*)    Hemoglobin 6.9 (*)    HCT 21.0 (*)    MCV 101.0 (*)    RDW 18.3 (*)    nRBC 0.3 (*)    Monocytes Absolute 1.1 (*)    All other components within normal limits  COMPREHENSIVE METABOLIC PANEL WITH GFR - Abnormal; Notable for the following components:   Sodium 134 (*)    CO2 21 (*)    Glucose, Bld 113 (*)    Calcium 8.3 (*)    Total Protein 6.2 (*)    Albumin 3.0 (*)    AST 47 (*)    All other components within normal limits  OCCULT BLOOD X 1 CARD TO LAB, STOOL  PROTIME-INR  POC OCCULT BLOOD, ED    EKG None  Radiology DG Chest 2 View Result Date: 06/02/2023 CLINICAL DATA:  Mild cough and wheezing. EXAM: CHEST - 2 VIEW COMPARISON:  Chest radiograph dated 03/24/2023. FINDINGS: The heart size and mediastinal contours are unchanged. No focal consolidation, pleural effusion, or pneumothorax. Prior right reverse shoulder arthroplasty. No acute osseous abnormality. IMPRESSION: No acute cardiopulmonary findings. Electronically Signed   By: Ruben Henry M.D.   On: 06/02/2023 12:44    Procedures Procedures    Medications Ordered in ED Medications  pantoprazole (PROTONIX) injection 40 mg (40 mg Intravenous Given 06/02/23 1557)  octreotide (SANDOSTATIN) 500 mcg in sodium chloride 0.9 % 250 mL (2 mcg/mL) infusion (50 mcg/hr Intravenous New Bag/Given 06/02/23 1608)  cefTRIAXone (ROCEPHIN) 2 g in sodium chloride 0.9 % 100 mL IVPB (0 g Intravenous Stopped 06/02/23 1659)    ED Course/ Medical Decision Making/ A&P                                 Medical Decision Making Amount and/or Complexity of Data Reviewed Labs: ordered.  Risk Decision regarding hospitalization.     Differential diagnosis includes but is not limited to hemorrhoid bleed, lower GI bleed, upper GI  bleed, iron deficiency anemia  ED Course:  Upon initial evaluation, patient is well-appearing, stable vital signs aside from elevated blood pressure 150/68.  Hemoglobin was found to be 7.7 earlier today PCP, and on CBC taken here hemoglobin 6.9.  I performed rectal exam and did not appreciate any obvious rectal bleeding.  I was not able to obtain stool sample on rectal exam,  unsure what his stool appearance is.  However, he reports black stools for the past 2 weeks.  Labs Ordered: I Ordered, and personally interpreted labs.  The pertinent results include:   CBC with hemoglobin 6.9 PT/INR within normal limits CMP pending I had ordered type and screen, but unable to perform this here at [facility].  This will need to be performed at the main hospital.  Cardiac Monitoring: / EKG: The patient was maintained on a cardiac monitor.  I personally viewed and interpreted the cardiac monitored which showed an underlying rhythm of: Normal sinus rhythm   Consultations Obtained: I requested consultation with the hospitalist Ruben Henry,  and discussed lab and imaging findings as well as pertinent plan - they recommend: Admission I requested consultation with the gastroenterologist Ruben Henry,  and discussed lab and imaging findings as well as pertinent plan - they recommend: will plan to see patient in consult.  He was made aware patient is being transferred to [facility]  Medications Given: 2 g ceftriaxone, 40 mg Protonix, octreotide ordered for GI bleed  Upon re-evaluation, patient still well-appearing, stable vital signs.  He was informed that his hemoglobin is now below 7 and he will likely need blood transfusion once he arrives at [facility].  He will need his type and screen performed once he gets to [place] [facility].    Impression: Upper GI bleed Blood loss anemia  Disposition:  Patient admitted with hospitalist Ruben Henry at [facility].  Ruben Henry has scheduled patient for EGD tomorrow  afternoon.   Record Review: External records from outside source obtained and reviewed including gastroenterology visit from 03/11/2023 where he was seen by Ruben Henry with gastroenterology for cirrhosis PCP visit from earlier today where he was seen for fatigue and had his blood counts checked, appears he supposed to undergo colonoscopy and endoscopy next week     This chart was dictated using voice recognition software, Dragon. Despite the best efforts of this provider to proofread and correct errors, errors may still occur which can change documentation meaning.          Final Clinical Impression(s) / ED Diagnoses Final diagnoses:  Low hemoglobin    Rx / DC Orders ED Discharge Orders     None         Ruben Catena, PA-C 06/02/23 1744    Afton Horse T, DO 06/03/23 [phone]

## 2023-06-02 NOTE — ED Triage Notes (Signed)
 Black stools x 2 weeks  had labs drawn today   H 7,7 sent for further tests from Dr office

## 2023-06-02 NOTE — Progress Notes (Signed)
 Subjective:    Patient ID: Ruben Henry, male    DOB: [birth year], 78 y.o.   MRN: [id number]  HPI  Ruben Henry "Ruben Henry" is a 78 year old male with anemia who presents with fatigue.  He has been experiencing fatigue since last Friday, which began after walking a couple of miles daily from Monday to Thursday while at the beach. The fatigue worsened over the weekend but has slightly improved since then. No fever, chills, sweats, or body aches are associated with the fatigue. The fatigue is reminiscent of previous episodes related to anemia.  He has a history of anemia with fluctuating hemoglobin levels. Four months ago, his hemoglobin was 9.5, which improved to 13.8 two months ago after iron supplementation. He had reduced his iron intake to one pill a day and had stopped taking it for a period. Upon feeling fatigued, he resumed taking one iron pill daily. He mentions that his iron levels increased, but his ferritin levels did not.  He noticed black stools last week before restarting iron supplementation. The stools were described as having a 'pudding with sand' consistency initially, but have since become solid, though still black. He has a history of blood in the stool and previous endoscopic evaluations, which revealed a bleeding issue treated with prednisone and antibiotics.  He has a history of asthma and is currently using Trelegy due to insurance coverage issues with Symbicort. He has not needed to use albuterol recently. He experiences wheezing and a faint roughness at the lung bases but denies any significant shortness of breath or coughing up mucus. His breathing stabilizes quickly after rest, unlike during asthma attacks which require albuterol.  He has a history of internal hemorrhoids and external hemorrhoids, which were not thrombosed or bleeding. A recent stool test was positive for blood, which could be attributed to the internal hemorrhoids.       Review of Systems   Constitutional:  Positive for fatigue. Negative for diaphoresis and fever.  HENT:  Negative for congestion.   Respiratory:  Positive for cough and wheezing. Negative for chest tightness.   Cardiovascular:  Negative for chest pain and palpitations.  Gastrointestinal:  Negative for abdominal pain, constipation, diarrhea and vomiting.       Black stool but on iron.  Genitourinary:  Negative for dysuria and flank pain.  Musculoskeletal:  Negative for back pain and myalgias.  Skin:  Negative for rash.  Neurological:  Negative for dizziness, speech difficulty, weakness and light-headedness.  Hematological:  Negative for adenopathy. Does not bruise/bleed easily.  Psychiatric/Behavioral:  Negative for behavioral problems and decreased concentration.     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   CAD (coronary artery disease) 07/20/2013   s/p remote PCI of RCA and LCX in 1998   Hypercholesterolemia    Hypertension    Osteoarthritis of right hip    Prostate cancer [facility])    brachytherapy     Social History   Socioeconomic History   Marital status: Married    Spouse name: Not on file   Number of children: 2   Years of education: Not on file   Highest education level: Bachelor's degree (e.g., BA, AB, BS)  Occupational History   Occupation: Garment/textile technologist: [employer]  Tobacco Use   Smoking status: Never   Smokeless tobacco: Never  Vaping Use   Vaping  status: Never Used  Substance and Sexual Activity   Alcohol use: Yes    Alcohol/week: 14.0 standard drinks of alcohol    Types: 14 Cans of beer per week    Comment: couple of beers a day. [employer].   Drug use: No   Sexual activity: Yes  Other Topics Concern   Not on file  Social History Narrative   Not on file   Social Drivers of Health   Financial Resource Strain: Low Risk  (03/23/2023)   Overall Financial  Resource Strain (CARDIA)    Difficulty of Paying Living Expenses: Not hard at all  Food Insecurity: No Food Insecurity (03/23/2023)   Hunger Vital Sign    Worried About Running Out of Food in the Last Year: Never true    Ran Out of Food in the Last Year: Never true  Transportation Needs: No Transportation Needs (03/23/2023)   PRAPARE - Administrator, Civil Service (Medical): No    Lack of Transportation (Non-Medical): No  Physical Activity: Sufficiently Active (03/23/2023)   Exercise Vital Sign    Days of Exercise per Week: 4 days    Minutes of Exercise per Session: 60 min  Stress: No Stress Concern Present (03/23/2023)   [employer] of Occupational Health - Occupational Stress Questionnaire    Feeling of Stress : Only a little  Social Connections: Unknown (03/23/2023)   Social Connection and Isolation Panel [NHANES]    Frequency of Communication with Friends and Family: Once a week    Frequency of Social Gatherings with Friends and Family: Patient declined    Attends Religious Services: Patient declined    Database administrator or Organizations: Yes    Attends Banker Meetings: Patient declined    Marital Status: Married  Catering manager Violence: Not At Risk (05/28/2022)   Humiliation, Afraid, Rape, and Kick questionnaire    Fear of Current or Ex-Partner: No    Emotionally Abused: No    Physically Abused: No    Sexually Abused: No    Past Surgical History:  Procedure Laterality Date   CORONARY ANGIOPLASTY WITH STENT PLACEMENT  06/21/1996   prior angioplasty of the RCA with stenting of the LCX in 1998   EYE SURGERY  1969   Right eye /    TONSILLECTOMY AND ADENOIDECTOMY      Family History  Problem Relation Age of Onset   Heart failure Mother    Esophageal cancer Father        died age 2, exp to asbestos   Cancer Father    Diabetes Sister    Allergies Sister    Heart attack Maternal Uncle 67   Coronary artery disease Maternal Uncle 49        with CABG    No Known Allergies  Current Outpatient Medications on File Prior to Visit  Medication Sig Dispense Refill   albuterol (VENTOLIN HFA) 108 (90 Base) MCG/ACT inhaler USE 2 INHALATIONS BY MOUTH EVERY 6 HOURS AS NEEDED FOR WHEEZING  OR SHORTNESS OF BREATH 34 g 2   aspirin 81 MG tablet Take 81 mg by mouth every other day.      azelastine (ASTELIN) 0.1 % nasal spray Place 1 spray into both nostrils 2 (two) times daily. Use in each nostril as directed 30 mL 5   ezetimibe (ZETIA) 10 MG tablet Take 1 tablet (10 mg total) by mouth daily. 90 tablet 3   fluticasone (FLONASE) 50 MCG/ACT nasal spray Place 2 sprays into  both nostrils daily. (Patient taking differently: Place 2 sprays into both nostrils daily as needed.) 16 g 2   Fluticasone-Umeclidin-Vilant (TRELEGY ELLIPTA) 100-62.5-25 MCG/ACT AEPB Inhale 1 puff into the lungs daily. 3 each 3   ipratropium (ATROVENT) 0.02 % nebulizer solution Take 0.5 mg by nebulization every 4 (four) hours as needed for wheezing or shortness of breath.     Iron, Ferrous Sulfate, 325 (65 Fe) MG TABS 1 tab po bid 60 tablet 1   levalbuterol (XOPENEX) 0.63 MG/3ML nebulizer solution Take 0.63 mg by nebulization every 4 (four) hours as needed for wheezing or shortness of breath.     losartan (COZAAR) 25 MG tablet Take 1 tablet (25 mg total) by mouth daily. 90 tablet 3   methylPREDNISolone (MEDROL) 4 MG tablet Standard 6 day taper 21 tablet 0   methylPREDNISolone (MEDROL) 4 MG tablet Standard 6 day dose pack 21 tablet 0   Omega-3 Fatty Acids (OMEGA 3 PO) Take 1 capsule by mouth daily.     senna (SENOKOT) 8.6 MG tablet Take 1 tablet by mouth every other day.     No current facility-administered medications on file prior to visit.    BP 120/62   Pulse 86   Resp 18   Ht 5\' 10"  (1.778 m)   Wt 197 lb 12.8 oz (89.7 kg)   SpO2 99%   BMI 28.38 kg/m         Objective:   Physical Exam  General Mental Status- Alert. General Appearance- Not in acute distress.    Skin General: Color- Normal Color. Moisture- Normal Moisture.  Neck . No JVD.  Chest and Lung Exam Auscultation: Breath Sounds:-CTA  Cardiovascular Auscultation:Rythm- RRR Murmurs & Other Heart Sounds:Auscultation of the heart reveals- No Murmurs.  Abdomen Inspection:-Inspeection Normal. Palpation/Percussion:Note:No mass. Palpation and Percussion of the abdomen reveal- Non Tender, Non Distended + BS, no rebound or guarding.   Neurologic Cranial Nerve exam:- CN III-XII intact(No nystagmus), symmetric smile. Strength:- 5/5 equal and symmetric strength both upper and lower extremities.   Rectal- external hemorrhoid but not thrombosed. No bleeding. + guaic stool card.    Assessment & Plan:   Patient Instructions  Fatigue Fatigue likely related to anemia with fluctuating hemoglobin levels. Black stools suggest possible gastrointestinal bleeding though on iron tab otc once daily. - Repeat CBC to assess hemoglobin levels and blood volume. - Refer to hematologist for further evaluation since iron now normal but ferritin decreased. - Undergo scheduled colonoscopy and endoscopy next week.   Cough mild but wheezing and fatigue. So will get cxr - Perform chest x-ray to rule out pneumonia. -continue current inhalers.   Gastrointestinal bleeding Black stools suggest gastrointestinal bleeding. Scheduled colonoscopy and endoscopy to identify bleeding sources. - Perform colonoscopy and endoscopy to investigate potential sources of bleeding. - Repeat CBC to assess hemoglobin levels and blood volume.  Anemia Anemia with fluctuating hemoglobin levels. Iron deficiency suspected due to low ferritin. Stool card + for blood. Possible internal hemorrhoid. - Continue iron supplementation once daily. - Refer to hematologist for further evaluation. - Perform colonoscopy and endoscopy next week will be able  to investigate potential sources of bleeding.  Asthma Asthma managed with Trelegy.  No recent exacerbations. Wheezing and roughness noted, chest x-ray planned. - Continue Trelegy inhaler. Albuterol inhaler as needed. - Perform chest x-ray to rule out other respiratory conditions.  Folow up date to be determined after lab and imaging review.

## 2023-06-02 NOTE — Patient Instructions (Signed)
 Fatigue Fatigue likely related to anemia with fluctuating hemoglobin levels. Black stools suggest possible gastrointestinal bleeding though on iron tab otc once daily. - Repeat CBC to assess hemoglobin levels and blood volume. - Refer to hematologist for further evaluation since iron now normal but ferritin decreased. - Undergo scheduled colonoscopy and endoscopy next week.   Cough mild but wheezing and fatigue. So will get cxr - Perform chest x-ray to rule out pneumonia. -continue current inhalers.   Gastrointestinal bleeding Black stools suggest gastrointestinal bleeding. Scheduled colonoscopy and endoscopy to identify bleeding sources. - Perform colonoscopy and endoscopy to investigate potential sources of bleeding. - Repeat CBC to assess hemoglobin levels and blood volume.  Anemia Anemia with fluctuating hemoglobin levels. Iron deficiency suspected due to low ferritin. Stool card + for blood. Possible internal hemorrhoid. - Continue iron supplementation once daily. - Refer to hematologist for further evaluation. - Perform colonoscopy and endoscopy next week will be able  to investigate potential sources of bleeding.  Asthma Asthma managed with Trelegy. No recent exacerbations. Wheezing and roughness noted, chest x-ray planned. - Continue Trelegy inhaler. Albuterol inhaler as needed. - Perform chest x-ray to rule out other respiratory conditions.  Folow up date to be determined after lab and imaging review.

## 2023-06-02 NOTE — Telephone Encounter (Signed)
 CRITICAL VALUE STICKER  CRITICAL VALUE: Hgb: 7.7 HCT: 23.3  RECEIVER (on-site recipient of call): Francena Infield, [place]  DATE & TIME NOTIFIED:  06/02/23, 12:33 PM  MESSENGER (representative from lab): [facility]  MD NOTIFIED: PCP: Sylvia Everts PA-C  TIME OF NOTIFICATION: 06/02/23, 12:33 PM  RESPONSE:  Pending

## 2023-06-02 NOTE — ED Notes (Signed)
 Critical value called from lab - Hgb 6.9. Dr. Martina Sledge aware.   Anastacio Balm, RN

## 2023-06-02 NOTE — Progress Notes (Addendum)
 78 year old male from med [employer] with complaints of generalized weakness fatigue black stools and was at his primary care physician's office today and found his hemoglobin to be 7.7 and was sent to the ER for evaluation.  Patient is not on any DOAC or NSAIDs.  ED physician discussed with Dr. Lavaughn Portland and I accepted him to inpatient starting Protonix.  Admitting diagnosis GI bleed NPO after MN for EGD in am per GI.

## 2023-06-03 ENCOUNTER — Encounter (HOSPITAL_COMMUNITY)
Admission: EM | Disposition: A | Discharge status: Home / Self Care | Payer: Self-pay | Source: Home / Self Care | Attending: Internal Medicine

## 2023-06-03 ENCOUNTER — Inpatient Hospital Stay (HOSPITAL_COMMUNITY)

## 2023-06-03 ENCOUNTER — Encounter (HOSPITAL_COMMUNITY): Payer: Self-pay | Admitting: Internal Medicine

## 2023-06-03 ENCOUNTER — Encounter: Payer: Self-pay | Admitting: Medical

## 2023-06-03 DIAGNOSIS — K2900 Acute gastritis without bleeding: Secondary | ICD-10-CM

## 2023-06-03 DIAGNOSIS — K922 Gastrointestinal hemorrhage, unspecified: Secondary | ICD-10-CM

## 2023-06-03 DIAGNOSIS — I8500 Esophageal varices without bleeding: Secondary | ICD-10-CM | POA: Diagnosis not present

## 2023-06-03 LAB — CBC
HCT: 22.8 % — ABNORMAL LOW (ref 39.0–52.0)
HCT: 22 % — ABNORMAL LOW (ref 39.0–52.0)
HCT: 22.7 % — ABNORMAL LOW (ref 39.0–52.0)
Hemoglobin: 7.1 g/dL — ABNORMAL LOW (ref 13.0–17.0)
Hemoglobin: 7.1 g/dL — ABNORMAL LOW (ref 13.0–17.0)
Hemoglobin: 7 g/dL — ABNORMAL LOW (ref 13.0–17.0)
MCH: 33.5 pg (ref 26.0–34.0)
MCH: 33.8 pg (ref 26.0–34.0)
MCH: 33.3 pg (ref 26.0–34.0)
MCHC: 31.1 g/dL (ref 30.0–36.0)
MCHC: 32.3 g/dL (ref 30.0–36.0)
MCHC: 30.8 g/dL (ref 30.0–36.0)
MCV: 107.5 fL — ABNORMAL HIGH (ref 80.0–100.0)
MCV: 104.8 fL — ABNORMAL HIGH (ref 80.0–100.0)
MCV: 108.1 fL — ABNORMAL HIGH (ref 80.0–100.0)
Platelets: 230 10*3/uL (ref 150–400)
Platelets: 242 10*3/uL (ref 150–400)
Platelets: 250 10*3/uL (ref 150–400)
RBC: 2.12 MIL/uL — ABNORMAL LOW (ref 4.22–5.81)
RBC: 2.1 MIL/uL — ABNORMAL LOW (ref 4.22–5.81)
RBC: 2.1 MIL/uL — ABNORMAL LOW (ref 4.22–5.81)
RDW: 18.4 % — ABNORMAL HIGH (ref 11.5–15.5)
RDW: 17.8 % — ABNORMAL HIGH (ref 11.5–15.5)
RDW: 17.5 % — ABNORMAL HIGH (ref 11.5–15.5)
WBC: 6.2 10*3/uL (ref 4.0–10.5)
WBC: 5.4 10*3/uL (ref 4.0–10.5)
WBC: 5 10*3/uL (ref 4.0–10.5)
nRBC: 0 % (ref 0.0–0.2)
nRBC: 0 % (ref 0.0–0.2)
nRBC: 0 % (ref 0.0–0.2)

## 2023-06-03 LAB — HEMOGLOBIN AND HEMATOCRIT, BLOOD
HCT: 23.9 % — ABNORMAL LOW (ref 39.0–52.0)
Hemoglobin: 7.2 g/dL — ABNORMAL LOW (ref 13.0–17.0)

## 2023-06-03 SURGERY — EGD (ESOPHAGOGASTRODUODENOSCOPY)
Anesthesia: Monitor Anesthesia Care

## 2023-06-03 MED ORDER — PROPOFOL 500 MG/50ML IV EMUL
INTRAVENOUS | Status: DC | PRN
  Administered 2023-06-03: 50 mg via INTRAVENOUS
  Administered 2023-06-03: 40 mg via INTRAVENOUS
  Administered 2023-06-03: 100 mg via INTRAVENOUS

## 2023-06-03 MED ORDER — SODIUM CHLORIDE 0.9 % IV SOLN
50.0000 ug/h | INTRAVENOUS | Status: DC
  Administered 2023-06-03: 50 ug/h via INTRAVENOUS
  Filled 2023-06-03 (×2): qty 1

## 2023-06-03 MED ORDER — SODIUM CHLORIDE 0.9 % IV SOLN
2.0000 g | INTRAVENOUS | Status: DC
  Administered 2023-06-03: 2 g via INTRAVENOUS
  Filled 2023-06-03: qty 20

## 2023-06-03 MED ORDER — LIDOCAINE 2% (20 MG/ML) 5 ML SYRINGE
INTRAMUSCULAR | Status: DC | PRN
  Administered 2023-06-03: 80 mg via INTRAVENOUS

## 2023-06-03 MED ORDER — SODIUM CHLORIDE 0.9 % IV SOLN: INTRAVENOUS | Status: DC

## 2023-06-03 MED ORDER — ORAL CARE MOUTH RINSE: 15.0000 mL | OROMUCOSAL | Status: DC | PRN

## 2023-06-03 NOTE — Anesthesia Preprocedure Evaluation (Addendum)
 Anesthesia Evaluation  Patient identified by MRN, date of birth, ID band Patient awake    Reviewed: Allergy & Precautions, H&P , NPO status , Patient's Chart, lab work & pertinent test results  Airway Mallampati: II  TM Distance: >3 FB Neck ROM: Full    Dental no notable dental hx.    Pulmonary asthma , neg sleep apnea, neg COPD   Pulmonary exam normal breath sounds clear to auscultation       Cardiovascular hypertension, (-) angina + CAD and + Cardiac Stents  Normal cardiovascular exam Rhythm:Regular Rate:Normal     Neuro/Psych neg Seizures negative neurological ROS  negative psych ROS   GI/Hepatic negative GI ROS, Neg liver ROS,,,  Endo/Other  negative endocrine ROS    Renal/GU negative Renal ROS  negative genitourinary   Musculoskeletal  (+) Arthritis ,    Abdominal   Peds negative pediatric ROS (+)  Hematology negative hematology ROS (+)   Anesthesia Other Findings Gastrointestinal bleed  Reproductive/Obstetrics negative OB ROS                             Anesthesia Physical Anesthesia Plan  ASA: 3  Anesthesia Plan: MAC   Post-op Pain Management:    Induction: Intravenous  PONV Risk Score and Plan: 1 and Propofol infusion and Treatment may vary due to age or medical condition  Airway Management Planned: Natural Airway  Additional Equipment:   Intra-op Plan:   Post-operative Plan:   Informed Consent: I have reviewed the patients History and Physical, chart, labs and discussed the procedure including the risks, benefits and alternatives for the proposed anesthesia with the patient or authorized representative who has indicated his/her understanding and acceptance.     Dental advisory given  Plan Discussed with: CRNA  Anesthesia Plan Comments:        Anesthesia Quick Evaluation

## 2023-06-03 NOTE — Interval H&P Note (Signed)
 History and Physical Interval Note:  06/03/2023 11:25 AM  Ruben Henry  has presented today for surgery, with the diagnosis of Gastrointestinal bleed.  The various methods of treatment have been discussed with the patient and family. After consideration of risks, benefits and other options for treatment, the patient has consented to  Procedure(s): EGD (ESOPHAGOGASTRODUODENOSCOPY) (N/A) as a surgical intervention.  The patient's history has been reviewed, patient examined, no change in status, stable for surgery.  I have reviewed the patient's chart and labs.  Questions were answered to the patient's satisfaction.     [name]

## 2023-06-03 NOTE — TOC Initial Note (Signed)
 Transition of Care [facility]) - Initial/Assessment Note    Patient Details  Name: SEARCY MIYOSHI MRN: [id number] Date of Birth: 1945-10-06  Transition of Care [facility]) CM/SW Contact:    Ruben Corolla, RN Phone Number: 06/03/2023, 11:17 AM  Clinical Narrative: d/c plan home.                  Expected Discharge Plan: Home/Self Care Barriers to Discharge: Continued Medical Work up   Patient Goals and CMS Choice Patient states their goals for this hospitalization and ongoing recovery are:: Home CMS Medicare.gov Compare Post Acute Care list provided to:: Patient Choice offered to / list presented to : Patient [place] ownership interest in [facility].provided to:: Patient    Expected Discharge Plan and Services                                              Prior Living Arrangements/Services                       Activities of Daily Living   ADL Screening (condition at time of admission) Independently performs ADLs?: Yes (appropriate for developmental age) Is the patient deaf or have difficulty hearing?: No Does the patient have difficulty seeing, even when wearing glasses/contacts?: No Does the patient have difficulty concentrating, remembering, or making decisions?: No  Permission Sought/Granted                  Emotional Assessment              Admission diagnosis:  GI bleed [K92.2] Low hemoglobin [D64.9] Patient Active Problem List   Diagnosis Date Noted   GI bleed 06/02/2023   Low hemoglobin 06/02/2023   Asthmatic bronchitis with acute exacerbation 12/05/2021   History of prostate cancer 05/09/2014   Benign essential hypertension 10/27/2013   CAD (coronary artery disease) 07/20/2013   Hypertension 01/20/2012   Hypercholesterolemia 01/20/2012   PCP:  Sylvia Everts, PA-C Pharmacy:   [facility] [address] [place], [place] - [address] [place] [address] Phone: [phone] Fax:  [phone]     Social Drivers of Health (SDOH) Social History: SDOH Screenings   Food Insecurity: No Food Insecurity (06/02/2023)  Housing: Low Risk  (06/02/2023)  Transportation Needs: No Transportation Needs (06/02/2023)  Utilities: Not At Risk (06/02/2023)  Alcohol Screen: Low Risk  (03/23/2023)  Depression (PHQ2-9): Low Risk  (05/28/2022)  Financial Resource Strain: Low Risk  (03/23/2023)  Physical Activity: Sufficiently Active (03/23/2023)  Social Connections: Unknown (06/02/2023)  Stress: No Stress Concern Present (03/23/2023)  Tobacco Use: Low Risk  (06/03/2023)   SDOH Interventions:     Readmission Risk Interventions     No data to display

## 2023-06-03 NOTE — Transfer of Care (Signed)
 Immediate Anesthesia Transfer of Care Note  Patient: Ruben Henry  Procedure(s) Performed: EGD (ESOPHAGOGASTRODUODENOSCOPY)  Patient Location: PACU  Anesthesia Type:MAC  Level of Consciousness: sedated and drowsy  Airway & Oxygen Therapy: Patient Spontanous Breathing and Patient connected to nasal cannula oxygen  Post-op Assessment: Report given to RN, Post -op Vital signs reviewed and stable, and Patient moving all extremities  Post vital signs: Reviewed and stable  Last Vitals:  Vitals Value Taken Time  BP 94/59 06/03/23 1153  Temp    Pulse 71 06/03/23 1153  Resp 13 06/03/23 1153  SpO2 100 % 06/03/23 1153    Last Pain:  Vitals:   06/03/23 1153  TempSrc: Temporal  PainSc:          Complications: No notable events documented.

## 2023-06-03 NOTE — Anesthesia Postprocedure Evaluation (Signed)
 Anesthesia Post Note  Patient: Ruben Henry  Procedure(s) Performed: EGD (ESOPHAGOGASTRODUODENOSCOPY)     Patient location during evaluation: PACU Anesthesia Type: MAC Level of consciousness: awake and alert Pain management: pain level controlled Vital Signs Assessment: post-procedure vital signs reviewed and stable Respiratory status: spontaneous breathing, nonlabored ventilation, respiratory function stable and patient connected to nasal cannula oxygen Cardiovascular status: blood pressure returned to baseline and stable Postop Assessment: no apparent nausea or vomiting Anesthetic complications: no   No notable events documented.  Last Vitals:  Vitals:   06/03/23 1214 06/03/23 1232  BP: 111/66 129/72  Pulse: 75 75  Resp: 12 15  Temp:  36.6 C  SpO2: 96% 100%    Last Pain:  Vitals:   06/03/23 1232  TempSrc: Oral  PainSc:                  [name]

## 2023-06-03 NOTE — H&P (Signed)
 History and Physical    Ruben Henry:[id number] DOB: [birth year] DOA: 06/02/2023  PCP: Sylvia Everts, PA-C   Chief Complaint:  melena  HPI: Ruben Henry is a 78 y.o. male with medical history significant of CAD, hypertension, hyperlipidemia who presented to emergency department at the recommendation of his primary care doctor.  He was feeling weak and has had black stools for the last 10 days.  He was scheduled to undergo EGD and colonoscopy for unders anemia in the coming week.  Repeat hemoglobin was checked which showed hemoglobin of 7.3 from 13.82 months ago.  He was referred to the ER where hemoglobin was repeated and found to be 6.9.  He was transfused and admitted.  Labs on admission showed sodium 137, AST 47, INR 1.1.  Patient had chest x-ray on presentation which showed no acute findings.  GI was consulted with plans for EGD in the morning.  Of note he had a colonoscopy approximately 1 year ago.  He was found to have 8 polyps with recommended 1 year follow-up evaluation he endorses prior history of capsule with reported small bowel ulceration.  He states that he was given steroids.  I cannot find record of this in the chart he endorses black stools.   Review of Systems: Review of Systems  Constitutional:  Negative for chills and fever.  HENT: Negative.    Eyes: Negative.   Respiratory: Negative.    Cardiovascular: Negative.   Gastrointestinal:  Positive for abdominal pain, nausea and vomiting.  Genitourinary: Negative.   Musculoskeletal: Negative.   Skin: Negative.   Neurological: Negative.   Endo/Heme/Allergies: Negative.   Psychiatric/Behavioral: Negative.    All other systems reviewed and are negative.    As per HPI otherwise 10 point review of systems negative.   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 He has never used smokeless tobacco. He reports current alcohol use of about 14.0 standard drinks of alcohol per week. He reports that he does not use drugs.  Family History  Problem Relation Age of Onset   Heart failure Mother    Esophageal cancer Father        died age 106, exp to asbestos   Cancer Father    Diabetes Sister    Allergies Sister    Heart attack Maternal Uncle 31   Coronary artery disease Maternal Uncle 45       with CABG    Prior to Admission medications   Medication Sig Start Date End Date Taking? Authorizing Provider  albuterol (VENTOLIN HFA) 108 (90 Base) MCG/ACT inhaler USE 2 INHALATIONS BY MOUTH EVERY 6 HOURS AS NEEDED FOR WHEEZING  OR SHORTNESS OF BREATH 01/25/23  Yes Saguier, Gaylin Ke, PA-C  ezetimibe (ZETIA) 10  MG tablet Take 1 tablet (10 mg total) by mouth daily. Patient taking differently: Take 10 mg by mouth at bedtime. 03/09/23  Yes Saguier, Gaylin Ke, PA-C  fluticasone (FLONASE) 50 MCG/ACT nasal spray Place 2 sprays into both nostrils daily. Patient taking differently: Place 2 sprays into both nostrils daily as needed for allergies or rhinitis. 12/20/20  Yes Saguier, Gaylin Ke, PA-C  Fluticasone-Umeclidin-Vilant (TRELEGY ELLIPTA) 100-62.5-25 MCG/ACT AEPB Inhale 1 puff into the lungs daily. 04/19/23  Yes Desai, Nikita S, MD  ipratropium (ATROVENT) 0.02 % nebulizer solution Take 0.5 mg by nebulization every 4 (four) hours as needed for wheezing or shortness of breath.   Yes [provider]   levalbuterol (XOPENEX) 0.63 MG/3ML nebulizer solution Take 0.63 mg by nebulization every 4 (four) hours as needed for wheezing or shortness of breath.   Yes [provider]  losartan (COZAAR) 25 MG tablet Take 1 tablet (25 mg total) by mouth daily. Patient taking differently: Take 25 mg by mouth in the morning. 03/09/23  Yes Saguier, Gaylin Ke, PA-C  Omega-3 Fatty Acids (FISH OIL) 1000 MG CAPS Take 1,000 mg by mouth daily.   Yes [provider]  senna (SENOKOT) 8.6 MG tablet Take 1 tablet by mouth See admin instructions. Take 1 tablet by mouth every other night   Yes [provider]  azelastine (ASTELIN) 0.1 % nasal spray Place 1 spray into both nostrils 2 (two) times daily. Use in each nostril as directed Patient not taking: Reported on 06/02/2023 10/14/22   Desai, Nikita S, MD  Iron, Ferrous Sulfate, 325 (65 Fe) MG TABS 1 tab po bid Patient not taking: Reported on 06/02/2023 01/21/23   Saguier, Gaylin Ke, PA-C  methylPREDNISolone (MEDROL) 4 MG tablet Standard 6 day taper Patient not taking: Reported on 06/02/2023 01/20/23   Saguier, Gaylin Ke, PA-C  methylPREDNISolone (MEDROL) 4 MG tablet Standard 6 day dose pack Patient not taking: Reported on 06/02/2023 03/24/23   Sylvia Everts, PA-C    Physical Exam: Vitals:   06/02/23 1345 06/02/23 1700 06/02/23 1757 06/02/23 2201  BP: (!) 150/68  (!) 146/80 (!) 145/83  Pulse: 92  86 82  Resp: 15  18 (!) 22  Temp: 98.2 F (36.8 C) 98.3 F (36.8 C) 98 F (36.7 C) 97.9 F (36.6 C)  TempSrc: Oral Oral Oral Oral  SpO2: 98%  99% 96%  Weight:      Height:   5\' 10"  (1.778 m)    Physical Exam Constitutional:      Appearance: He is normal weight.  HENT:     Head: Normocephalic.     Mouth/Throat:     Mouth: Mucous membranes are moist.     Pharynx: Oropharynx is clear.  Eyes:     Conjunctiva/sclera: Conjunctivae normal.     Pupils: Pupils are equal, round, and reactive to light.  Cardiovascular:     Rate and Rhythm: Normal rate and  regular rhythm.     Pulses: Normal pulses.     Heart sounds: Normal heart sounds.  Pulmonary:     Effort: Pulmonary effort is normal.     Breath sounds: Normal breath sounds.  Abdominal:     General: Abdomen is flat. Bowel sounds are normal.  Musculoskeletal:        General: Normal range of motion.     Cervical back: Normal range of motion.  Skin:    General: Skin is warm.     Capillary Refill: Capillary refill takes less than 2 seconds.  Neurological:     General:  No focal deficit present.     Mental Status: He is alert.  Psychiatric:        Mood and Affect: Mood normal.       Labs on Admission: I have personally reviewed the patients's labs and imaging studies.  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 midnight for EGD tomorrow Continue ceftriaxone  # Hyperlipidemia-continue Zetia  # Hypertension-continue losartan    Admission status: Inpatient Progressive  Certification: The appropriate patient status for this patient is INPATIENT. Inpatient status is judged to be reasonable and necessary in order to provide the required intensity of service to ensure the patient's safety. The patient's presenting symptoms, physical exam findings, and initial radiographic and laboratory data in the context of their chronic comorbidities is felt to place them at high risk for further clinical deterioration. Furthermore, it is not anticipated that the patient will be medically stable for discharge from the hospital within 2 midnights of admission.   * I certify that at the point of admission it is my clinical  judgment that the patient will require inpatient hospital care spanning beyond 2 midnights from the point of admission due to high intensity of service, high risk for further deterioration and high frequency of surveillance required.Myrl Askew MD [facility] If 7PM-7AM, please contact night-coverage [email or web address]  06/03/2023, 12:09 AM

## 2023-06-03 NOTE — Consult Note (Signed)
 Referring Provider: Dr. Jeannene Milling Primary Care Physician:  Francine Iron Primary Gastroenterologist:  Para Bold  Reason for Consultation:  Melena  HPI: Ruben Henry is a 78 y.o. male with 2 weeks of black tarry stools and developed mild lightheadedness this past Friday. Denies N/V, hematochezia. Hgb 6.9. Drinks 1-2 alcoholic drinks per day. Denies NSAIDs. Cirrhosis on U/S and scheduled for outpt EGD/colon next week by Dr. Feliberto Hopping. Multiple adenomas removed on colonoscopy in April 2024.  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 Authorizing Provider  albuterol (VENTOLIN HFA) 108 (90 Base) MCG/ACT inhaler USE 2 INHALATIONS BY MOUTH EVERY 6 HOURS AS NEEDED FOR WHEEZING  OR SHORTNESS OF BREATH 01/25/23  Yes Saguier, Gaylin Ke, PA-C  ezetimibe (ZETIA) 10 MG tablet Take 1 tablet (10 mg total) by mouth daily. Patient taking differently: Take 10 mg by mouth at bedtime. 03/09/23  Yes Saguier, Gaylin Ke, PA-C  fluticasone (FLONASE) 50 MCG/ACT nasal spray Place 2 sprays into both nostrils daily. Patient taking differently: Place 2 sprays into both nostrils daily as needed for allergies or rhinitis. 12/20/20  Yes Saguier, Gaylin Ke, PA-C  Fluticasone-Umeclidin-Vilant (TRELEGY ELLIPTA) 100-62.5-25 MCG/ACT AEPB Inhale 1 puff into the lungs daily. 04/19/23  Yes  Desai, Nikita S, MD  ipratropium (ATROVENT) 0.02 % nebulizer solution Take 0.5 mg by nebulization every 4 (four) hours as needed for wheezing or shortness of breath.   Yes [provider]  levalbuterol (XOPENEX) 0.63 MG/3ML nebulizer solution Take 0.63 mg by nebulization every 4 (four) hours as needed for wheezing or shortness of breath.   Yes [provider]  losartan (COZAAR) 25 MG tablet Take 1 tablet (25 mg total) by mouth daily. Patient taking differently: Take 25 mg by mouth in the morning. 03/09/23  Yes Saguier, Gaylin Ke, PA-C  Omega-3 Fatty Acids (FISH OIL) 1000 MG CAPS Take 1,000 mg by mouth daily.   Yes [provider]  senna (SENOKOT) 8.6 MG tablet Take 1 tablet by mouth See admin instructions. Take 1 tablet by mouth every other night   Yes [provider]  azelastine (ASTELIN) 0.1 % nasal spray Place 1 spray into both nostrils 2 (two) times daily. Use in each nostril as directed Patient not taking: Reported on 06/02/2023 10/14/22   Desai, Nikita S, MD  Iron, Ferrous Sulfate, 325 (65 Fe) MG TABS 1 tab po bid Patient not taking: Reported on 06/02/2023 01/21/23   Saguier, Gaylin Ke, PA-C  methylPREDNISolone (MEDROL) 4 MG tablet Standard 6 day taper Patient not taking: Reported on 06/02/2023 01/20/23   Saguier, Gaylin Ke, PA-C  methylPREDNISolone (MEDROL) 4 MG tablet Standard 6 day dose pack Patient not taking: Reported on 06/02/2023 03/24/23   Saguier, Edward, PA-C    Scheduled Meds:  budeson-glycopyrrolate-formoterol  2  puff Inhalation BID   ezetimibe  10 mg Oral Daily   losartan  25 mg Oral Daily   pantoprazole (PROTONIX) IV  40 mg Intravenous Q12H   Continuous Infusions:  cefTRIAXone (ROCEPHIN)  IV     octreotide (SANDOSTATIN) 500 mcg in sodium chloride 0.9 % 250 mL (2 mcg/mL) infusion 50 mcg/hr (06/03/23 1010)   PRN Meds:.acetaminophen **OR** acetaminophen, ondansetron **OR** ondansetron (ZOFRAN) IV, mouth rinse  Allergies as of 06/02/2023   (No Known  Allergies)    Family History  Problem Relation Age of Onset   Heart failure Mother    Esophageal cancer Father        died age 82, exp to asbestos   Cancer Father    Diabetes Sister    Allergies Sister    Heart attack Maternal Uncle 50   Coronary artery disease Maternal Uncle 27       with CABG    Social History   Socioeconomic History   Marital status: Married    Spouse name: Not on file   Number of children: 2   Years of education: Not on file   Highest education level: Bachelor's degree (e.g., BA, AB, BS)  Occupational History   Occupation: Garment/textile technologist: [employer]  Tobacco Use   Smoking status: Never   Smokeless tobacco: Never  Vaping Use   Vaping status: Never Used  Substance and Sexual Activity   Alcohol use: Yes    Alcohol/week: 14.0 standard drinks of alcohol    Types: 14 Cans of beer per week    Comment: couple of beers a day. [employer].   Drug use: No   Sexual activity: Yes  Other Topics Concern   Not on file  Social History Narrative   Not on file   Social Drivers of Health   Financial Resource Strain: Low Risk  (03/23/2023)   Overall Financial Resource Strain (CARDIA)    Difficulty of Paying Living Expenses: Not hard at all  Food Insecurity: No Food Insecurity (06/02/2023)   Hunger Vital Sign    Worried About Running Out of Food in the Last Year: Never true    Ran Out of Food in the Last Year: Never true  Transportation Needs: No Transportation Needs (06/02/2023)   PRAPARE - Administrator, Civil Service (Medical): No    Lack of Transportation (Non-Medical): No  Physical Activity: Sufficiently Active (03/23/2023)   Exercise Vital Sign    Days of Exercise per Week: 4 days    Minutes of Exercise per Session: 60 min  Stress: No Stress Concern Present (03/23/2023)   [employer] of Occupational Health - Occupational Stress Questionnaire    Feeling of Stress : Only a little  Social Connections: Unknown (06/02/2023)    Social Connection and Isolation Panel [NHANES]    Frequency of Communication with Friends and Family: Once a week    Frequency of Social Gatherings with Friends and Family: Patient declined    Attends Religious Services: Patient declined    Database administrator or Organizations: Yes    Attends Banker Meetings: Patient declined    Marital Status: Married  Catering manager Violence: Not At Risk (06/02/2023)   Humiliation, Afraid, Rape, and Kick questionnaire    Fear of Current or Ex-Partner: No    Emotionally Abused: No    Physically Abused: No    Sexually Abused: No    Review of Systems: All negative except as stated above in HPI.  Physical Exam: Vital signs: Vitals:   06/03/23 0457 06/03/23 0752  BP: 111/74   Pulse: 72   Resp: 20   Temp: 98.4 F (36.9 C)   SpO2: 98% 96%   Last BM Date : 06/02/23 General:  Lethargic, Well-developed, well-nourished, pleasant and cooperative in NAD Head: normocephalic, atraumatic Eyes: anicteric sclera ENT: oropharynx clear Neck: supple, nontender Lungs:  Clear throughout to auscultation.   No wheezes, crackles, or rhonchi. No acute distress. Heart:  Regular rate and rhythm; no murmurs, clicks, rubs,  or gallops. Abdomen: soft, nontender, nondistended, +BS  Rectal:  Deferred Ext: no edema  GI:  Lab Results: Recent Labs    06/02/23 2056 06/03/23 0036 06/03/23 0826  WBC 6.4 6.2 5.4  HGB 7.9* 7.1* 7.1*  HCT 25.0* 22.8* 22.0*  PLT 267 230 242   BMET Recent Labs    06/02/23 1351 06/02/23 2056  NA 134* 136  K 3.7 3.8  CL 105 105  CO2 21* 25  GLUCOSE 113* 137*  BUN 14 14  CREATININE 0.78 0.79  CALCIUM 8.3* 8.4*   LFT Recent Labs    06/02/23 1351  PROT 6.2*  ALBUMIN 3.0*  AST 47*  ALT 37  ALKPHOS 115  BILITOT <0.2   PT/INR Recent Labs    06/02/23 1439  LABPROT 14.6  INR 1.1     Studies/Results: DG Chest 2 View Result Date: 06/02/2023 CLINICAL DATA:  Mild cough and wheezing. EXAM: CHEST - 2  VIEW COMPARISON:  Chest radiograph dated 03/24/2023. FINDINGS: The heart size and mediastinal contours are unchanged. No focal consolidation, pleural effusion, or pneumothorax. Prior right reverse shoulder arthroplasty. No acute osseous abnormality. IMPRESSION: No acute cardiopulmonary findings. Electronically Signed   By: Mannie Seek M.D.   On: 06/02/2023 12:44    Impression/Plan: Melenic stools concerning for peptic ulcer disease. EGD today to further evaluate. Continue PPI IV Q 12 hours. Supportive care.    LOS: 1 day   [name]  06/03/2023, 10:47 AM  Questions please call [phone]

## 2023-06-03 NOTE — Op Note (Signed)
 [facility] Patient Name: Ruben Henry Procedure Date: 06/03/2023 MRN: [id number] Attending MD: Shirley Friar , MD, [id number] Date of Birth: 16-Sep-1945 CSN: [id number] Age: 78 Admit Type: Inpatient Procedure:                Upper GI endoscopy Indications:              Melena Providers:                Shirley Friar, MD, Vicki Mallet, RN, Rozetta Nunnery, Technician Referring MD:             hospital team Medicines:                Propofol per Anesthesia, Monitored Anesthesia Care Complications:            No immediate complications. Estimated Blood Loss:     Estimated blood loss was minimal. Procedure:                Pre-Anesthesia Assessment:                           - Prior to the procedure, a History and Physical                            was performed, and patient medications and                            allergies were reviewed. The patient's tolerance of                            previous anesthesia was also reviewed. The risks                            and benefits of the procedure and the sedation                            options and risks were discussed with the patient.                            All questions were answered, and informed consent                            was obtained. Prior Anticoagulants: The patient has                            taken no anticoagulant or antiplatelet agents. ASA                            Grade Assessment: III - A patient with severe                            systemic disease. After reviewing the risks and  benefits, the patient was deemed in satisfactory                            condition to undergo the procedure.                           After obtaining informed consent, the endoscope was                            passed under direct vision. Throughout the                            procedure, the patient's blood pressure, pulse, and                             oxygen saturations were monitored continuously. The                            GIF-H190 ([id number] Olympus endoscope was introduced                            through the mouth, and advanced to the second part                            of duodenum. The upper GI endoscopy was                            accomplished without difficulty. The patient                            tolerated the procedure well. Scope In: Scope Out: Findings:      Grade II varices were found in the distal esophagus.      The Z-line was regular and was found 45 cm from the incisors.      Segmental moderate inflammation characterized by congestion (edema),       erosions and erythema was found in the gastric antrum. Biopsies were       taken with a cold forceps for histology. Estimated blood loss was       minimal.      The cardia and gastric fundus were normal on retroflexion.      The examined duodenum was normal.      No bleeding stigmata seen on varices. Impression:               - Grade II esophageal varices.                           - Z-line regular, 45 cm from the incisors.                           - Acute gastritis. Biopsied.                           - Normal examined duodenum. Moderate Sedation:      N/A - MAC procedure Recommendation:           -  Clear liquid diet.                           - Observe patient's clinical course. Procedure Code(s):        --- Professional ---                           [phone], Esophagogastroduodenoscopy, flexible,                            transoral; with biopsy, single or multiple Diagnosis Code(s):        --- Professional ---                           K92.1, Melena (includes Hematochezia)                           I85.00, Esophageal varices without bleeding                           K29.00, Acute gastritis without bleeding CPT copyright 2022 American Medical Association. All rights reserved. The codes documented in this report are preliminary and  upon coder review may  be revised to meet current compliance requirements. [name], MD 06/03/2023 11:58:47 AM This report has been signed electronically. Number of Addenda: 0

## 2023-06-03 NOTE — Progress Notes (Signed)
 Progress Note   Patient: Ruben Henry ZOX:[id number] DOB: 05/24/1945 DOA: 06/02/2023     1 DOS: the patient was seen and examined on 06/03/2023   Brief hospital course: Ruben Henry is a 78 y.o. male with medical history significant of CAD, hypertension, hyperlipidemia who presented to emergency department at the recommendation of his primary care doctor.  He was feeling weak and has had black stools for the last 10 days.  He was scheduled to undergo EGD and colonoscopy for unders anemia in the coming week.  Repeat hemoglobin was checked which showed hemoglobin of 7.3 from 13.82 months ago.  He was referred to the ER where hemoglobin was repeated and found to be 6.9.  He was transfused and admitted.  Labs on admission showed sodium 137, AST 47, INR 1.1.  Patient had chest x-ray on presentation which showed no acute findings.  GI was consulted with plans for EGD in the morning.  Of note he had a colonoscopy approximately 1 year ago.  He was found to have 8 polyps with recommended 1 year follow-up evaluation he endorses prior history of capsule with reported small bowel ulceration.  He states that he was given steroids.  I cannot find record of this in the chart he endorses black stools.   The patient was taken for EGD with Dr. Bosie Clos on 06/03/2023. He was found to have grade II esophageal varicees and evidence of gastritis in the gastric antrum. No bleeding source was observed. The patient's hemoglobin since admission to [facility] has been 7.9>7.1>7.1. Will continue to monitor and transfuse for hemoglobin less than 7.0. The patient is continued on  protonix. Octrotide has been discontinued.  Assessment and Plan: Principal Problem:   GI bleed Active Problems:   Low hemoglobin   # Acute on chronic anemia most likely secondary to upper GI bleed - Patient Ruben Henry 10 days Ruben Henry with hemoglobin drop from 13-7 - Patient has reported history of ulcerations - CT abdomen pelvis obtained in February  demonstrated evidence of hepatic cirrhosis send  --The patient was taken for EGD with Dr. Bosie Clos on 06/03/2023. He was found to have grade II esophageal varicees and evidence of gastritis in the gastric antrum. No bleeding source was observed. The patient's hemoglobin since admission to [facility] has been 7.9>7.1>7.1. Will continue to monitor and transfuse for hemoglobin less than 7.0. The patient is continued on  protonix. Octrotide has been discontinued.   Plan: Twice daily PPI Continue octreotide Trend hemoglobin N.p.o. midnight for EGD tomorrow Continue ceftriaxone   # Hyperlipidemia-continue Zetia   # Hypertension-continue losartan       Admission status: Inpatient Progressive        Subjective: The patient is resting quietly. No new complaints. Spouse is at bedside.  Physical Exam: Vitals:   06/03/23 1153 06/03/23 1200 06/03/23 1214 06/03/23 1232  BP: (!) 94/59 (!) 105/59 111/66 129/72  Pulse: 71 66 75 75  Resp: 13 12 12 15   Temp: 97.6 F (36.4 C)   97.8 F (36.6 C)  TempSrc: Oral   Oral  SpO2: 100% 98% 96% 100%  Weight:      Height:       Exam:  Constitutional:  The patient is awake, alert, and oriented x 3. No acute distress. Respiratory:  No increased work of breathing. No wheezes, rales, or rhonchi No tactile fremitus Cardiovascular:  Regular rate and rhythm No murmurs, ectopy, or gallups. No lateral PMI. No thrills. Abdomen:  Abdomen is soft, non-tender, non-distended No hernias,  masses, or organomegaly Normoactive bowel sounds.  Musculoskeletal:  No cyanosis, clubbing, or edema Skin:  No rashes, lesions, ulcers palpation of skin: no induration or nodules Neurologic:  CN 2-12 intact Sensation all 4 extremities intact Psychiatric:  Mental status Mood, affect appropriate Orientation to person, place, time  judgment and insight appear intact  Data Reviewed:  Results of EGD, CBC, H&H, BMP  Family Communication: Spouse is at  bedside.  Disposition: Status is: Inpatient Remains inpatient appropriate because: GI bleed with severe anemia  Planned Discharge Destination: Home    Time spent: 34 minutes  Author: Camrie Stock, DO 06/03/2023 4:47 PM  For on call review [email or web address].

## 2023-06-03 NOTE — H&P (View-Only) (Signed)
 Referring Provider: Dr. Jeannene Milling Primary Care Physician:  Francine Iron Primary Gastroenterologist:  Para Bold  Reason for Consultation:  Melena  HPI: Ruben Henry is a 78 y.o. male with 2 weeks of black tarry stools and developed mild lightheadedness this past Friday. Denies N/V, hematochezia. Hgb 6.9. Drinks 1-2 alcoholic drinks per day. Denies NSAIDs. Cirrhosis on U/S and scheduled for outpt EGD/colon next week by Dr. Feliberto Hopping. Multiple adenomas removed on colonoscopy in April 2024.  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 Authorizing Provider  albuterol (VENTOLIN HFA) 108 (90 Base) MCG/ACT inhaler USE 2 INHALATIONS BY MOUTH EVERY 6 HOURS AS NEEDED FOR WHEEZING  OR SHORTNESS OF BREATH 01/25/23  Yes Saguier, Gaylin Ke, PA-C  ezetimibe (ZETIA) 10 MG tablet Take 1 tablet (10 mg total) by mouth daily. Patient taking differently: Take 10 mg by mouth at bedtime. 03/09/23  Yes Saguier, Gaylin Ke, PA-C  fluticasone (FLONASE) 50 MCG/ACT nasal spray Place 2 sprays into both nostrils daily. Patient taking differently: Place 2 sprays into both nostrils daily as needed for allergies or rhinitis. 12/20/20  Yes Saguier, Gaylin Ke, PA-C  Fluticasone-Umeclidin-Vilant (TRELEGY ELLIPTA) 100-62.5-25 MCG/ACT AEPB Inhale 1 puff into the lungs daily. 04/19/23  Yes  Desai, Nikita S, MD  ipratropium (ATROVENT) 0.02 % nebulizer solution Take 0.5 mg by nebulization every 4 (four) hours as needed for wheezing or shortness of breath.   Yes [provider]  levalbuterol (XOPENEX) 0.63 MG/3ML nebulizer solution Take 0.63 mg by nebulization every 4 (four) hours as needed for wheezing or shortness of breath.   Yes [provider]  losartan (COZAAR) 25 MG tablet Take 1 tablet (25 mg total) by mouth daily. Patient taking differently: Take 25 mg by mouth in the morning. 03/09/23  Yes Saguier, Gaylin Ke, PA-C  Omega-3 Fatty Acids (FISH OIL) 1000 MG CAPS Take 1,000 mg by mouth daily.   Yes [provider]  senna (SENOKOT) 8.6 MG tablet Take 1 tablet by mouth See admin instructions. Take 1 tablet by mouth every other night   Yes [provider]  azelastine (ASTELIN) 0.1 % nasal spray Place 1 spray into both nostrils 2 (two) times daily. Use in each nostril as directed Patient not taking: Reported on 06/02/2023 10/14/22   Desai, Nikita S, MD  Iron, Ferrous Sulfate, 325 (65 Fe) MG TABS 1 tab po bid Patient not taking: Reported on 06/02/2023 01/21/23   Saguier, Gaylin Ke, PA-C  methylPREDNISolone (MEDROL) 4 MG tablet Standard 6 day taper Patient not taking: Reported on 06/02/2023 01/20/23   Saguier, Gaylin Ke, PA-C  methylPREDNISolone (MEDROL) 4 MG tablet Standard 6 day dose pack Patient not taking: Reported on 06/02/2023 03/24/23   Saguier, Edward, PA-C    Scheduled Meds:  budeson-glycopyrrolate-formoterol  2  puff Inhalation BID   ezetimibe  10 mg Oral Daily   losartan  25 mg Oral Daily   pantoprazole (PROTONIX) IV  40 mg Intravenous Q12H   Continuous Infusions:  cefTRIAXone (ROCEPHIN)  IV     octreotide (SANDOSTATIN) 500 mcg in sodium chloride 0.9 % 250 mL (2 mcg/mL) infusion 50 mcg/hr (06/03/23 1010)   PRN Meds:.acetaminophen **OR** acetaminophen, ondansetron **OR** ondansetron (ZOFRAN) IV, mouth rinse  Allergies as of 06/02/2023   (No Known  Allergies)    Family History  Problem Relation Age of Onset   Heart failure Mother    Esophageal cancer Father        died age 82, exp to asbestos   Cancer Father    Diabetes Sister    Allergies Sister    Heart attack Maternal Uncle 50   Coronary artery disease Maternal Uncle 27       with CABG    Social History   Socioeconomic History   Marital status: Married    Spouse name: Not on file   Number of children: 2   Years of education: Not on file   Highest education level: Bachelor's degree (e.g., BA, AB, BS)  Occupational History   Occupation: Garment/textile technologist: [employer]  Tobacco Use   Smoking status: Never   Smokeless tobacco: Never  Vaping Use   Vaping status: Never Used  Substance and Sexual Activity   Alcohol use: Yes    Alcohol/week: 14.0 standard drinks of alcohol    Types: 14 Cans of beer per week    Comment: couple of beers a day. [employer].   Drug use: No   Sexual activity: Yes  Other Topics Concern   Not on file  Social History Narrative   Not on file   Social Drivers of Health   Financial Resource Strain: Low Risk  (03/23/2023)   Overall Financial Resource Strain (CARDIA)    Difficulty of Paying Living Expenses: Not hard at all  Food Insecurity: No Food Insecurity (06/02/2023)   Hunger Vital Sign    Worried About Running Out of Food in the Last Year: Never true    Ran Out of Food in the Last Year: Never true  Transportation Needs: No Transportation Needs (06/02/2023)   PRAPARE - Administrator, Civil Service (Medical): No    Lack of Transportation (Non-Medical): No  Physical Activity: Sufficiently Active (03/23/2023)   Exercise Vital Sign    Days of Exercise per Week: 4 days    Minutes of Exercise per Session: 60 min  Stress: No Stress Concern Present (03/23/2023)   [employer] of Occupational Health - Occupational Stress Questionnaire    Feeling of Stress : Only a little  Social Connections: Unknown (06/02/2023)    Social Connection and Isolation Panel [NHANES]    Frequency of Communication with Friends and Family: Once a week    Frequency of Social Gatherings with Friends and Family: Patient declined    Attends Religious Services: Patient declined    Database administrator or Organizations: Yes    Attends Banker Meetings: Patient declined    Marital Status: Married  Catering manager Violence: Not At Risk (06/02/2023)   Humiliation, Afraid, Rape, and Kick questionnaire    Fear of Current or Ex-Partner: No    Emotionally Abused: No    Physically Abused: No    Sexually Abused: No    Review of Systems: All negative except as stated above in HPI.  Physical Exam: Vital signs: Vitals:   06/03/23 0457 06/03/23 0752  BP: 111/74   Pulse: 72   Resp: 20   Temp: 98.4 F (36.9 C)   SpO2: 98% 96%   Last BM Date : 06/02/23 General:  Lethargic, Well-developed, well-nourished, pleasant and cooperative in NAD Head: normocephalic, atraumatic Eyes: anicteric sclera ENT: oropharynx clear Neck: supple, nontender Lungs:  Clear throughout to auscultation.   No wheezes, crackles, or rhonchi. No acute distress. Heart:  Regular rate and rhythm; no murmurs, clicks, rubs,  or gallops. Abdomen: soft, nontender, nondistended, +BS  Rectal:  Deferred Ext: no edema  GI:  Lab Results: Recent Labs    06/02/23 2056 06/03/23 0036 06/03/23 0826  WBC 6.4 6.2 5.4  HGB 7.9* 7.1* 7.1*  HCT 25.0* 22.8* 22.0*  PLT 267 230 242   BMET Recent Labs    06/02/23 1351 06/02/23 2056  NA 134* 136  K 3.7 3.8  CL 105 105  CO2 21* 25  GLUCOSE 113* 137*  BUN 14 14  CREATININE 0.78 0.79  CALCIUM 8.3* 8.4*   LFT Recent Labs    06/02/23 1351  PROT 6.2*  ALBUMIN 3.0*  AST 47*  ALT 37  ALKPHOS 115  BILITOT <0.2   PT/INR Recent Labs    06/02/23 1439  LABPROT 14.6  INR 1.1     Studies/Results: DG Chest 2 View Result Date: 06/02/2023 CLINICAL DATA:  Mild cough and wheezing. EXAM: CHEST - 2  VIEW COMPARISON:  Chest radiograph dated 03/24/2023. FINDINGS: The heart size and mediastinal contours are unchanged. No focal consolidation, pleural effusion, or pneumothorax. Prior right reverse shoulder arthroplasty. No acute osseous abnormality. IMPRESSION: No acute cardiopulmonary findings. Electronically Signed   By: Mannie Seek M.D.   On: 06/02/2023 12:44    Impression/Plan: Melenic stools concerning for peptic ulcer disease. EGD today to further evaluate. Continue PPI IV Q 12 hours. Supportive care.    LOS: 1 day   [name]  06/03/2023, 10:47 AM  Questions please call [phone]

## 2023-06-04 ENCOUNTER — Encounter (HOSPITAL_COMMUNITY): Payer: Self-pay | Admitting: Gastroenterology

## 2023-06-04 DIAGNOSIS — D649 Anemia, unspecified: Secondary | ICD-10-CM | POA: Diagnosis not present

## 2023-06-04 DIAGNOSIS — K922 Gastrointestinal hemorrhage, unspecified: Secondary | ICD-10-CM | POA: Diagnosis not present

## 2023-06-04 LAB — COMPREHENSIVE METABOLIC PANEL WITH GFR
ALT: 41 U/L (ref 0–44)
AST: 50 U/L — ABNORMAL HIGH (ref 15–41)
Albumin: 3.1 g/dL — ABNORMAL LOW (ref 3.5–5.0)
Alkaline Phosphatase: 89 U/L (ref 38–126)
Anion gap: 9 (ref 5–15)
BUN: 9 mg/dL (ref 8–23)
CO2: 22 mmol/L (ref 22–32)
Calcium: 8.2 mg/dL — ABNORMAL LOW (ref 8.9–10.3)
Chloride: 105 mmol/L (ref 98–111)
Creatinine, Ser: 0.7 mg/dL (ref 0.61–1.24)
GFR, Estimated: >60 mL/min (ref >60)
Glucose, Bld: 128 mg/dL — ABNORMAL HIGH (ref 70–99)
Potassium: 3.6 mmol/L (ref 3.5–5.1)
Sodium: 136 mmol/L (ref 135–145)
Total Bilirubin: 0.9 mg/dL (ref 0.0–1.2)
Total Protein: 6.2 g/dL — ABNORMAL LOW (ref 6.5–8.1)

## 2023-06-04 LAB — CBC
HCT: 23.4 % — ABNORMAL LOW (ref 39.0–52.0)
HCT: 23.4 % — ABNORMAL LOW (ref 39.0–52.0)
Hemoglobin: 7.4 g/dL — ABNORMAL LOW (ref 13.0–17.0)
Hemoglobin: 7.2 g/dL — ABNORMAL LOW (ref 13.0–17.0)
MCH: 33.9 pg (ref 26.0–34.0)
MCH: 33.2 pg (ref 26.0–34.0)
MCHC: 31.6 g/dL (ref 30.0–36.0)
MCHC: 30.8 g/dL (ref 30.0–36.0)
MCV: 107.3 fL — ABNORMAL HIGH (ref 80.0–100.0)
MCV: 107.8 fL — ABNORMAL HIGH (ref 80.0–100.0)
Platelets: 255 10*3/uL (ref 150–400)
Platelets: 262 10*3/uL (ref 150–400)
RBC: 2.18 MIL/uL — ABNORMAL LOW (ref 4.22–5.81)
RBC: 2.17 MIL/uL — ABNORMAL LOW (ref 4.22–5.81)
RDW: 17.5 % — ABNORMAL HIGH (ref 11.5–15.5)
RDW: 17.2 % — ABNORMAL HIGH (ref 11.5–15.5)
WBC: 6.2 10*3/uL (ref 4.0–10.5)
WBC: 6.1 10*3/uL (ref 4.0–10.5)
nRBC: 0 % (ref 0.0–0.2)
nRBC: 0 % (ref 0.0–0.2)

## 2023-06-04 LAB — SURGICAL PATHOLOGY

## 2023-06-04 LAB — HEMOGLOBIN AND HEMATOCRIT, BLOOD
HCT: 24.2 % — ABNORMAL LOW (ref 39.0–52.0)
Hemoglobin: 7.5 g/dL — ABNORMAL LOW (ref 13.0–17.0)

## 2023-06-04 MED ORDER — PANTOPRAZOLE SODIUM 40 MG PO TBEC: 40.0000 mg | DELAYED_RELEASE_TABLET | Freq: Two times a day (BID) | ORAL | 1 refills | Status: DC

## 2023-06-04 MED ORDER — PANTOPRAZOLE SODIUM 40 MG IV SOLR: 40.0000 mg | Freq: Two times a day (BID) | INTRAVENOUS | 1 refills | Status: DC

## 2023-06-04 NOTE — Progress Notes (Addendum)
 [name] 11:02 AM  Subjective: Patient seen and examined in his hospital computer chart reviewed and case discussed with my partner Dr. Honey Lusty as well as his wife and he has no GI complaints no signs of bleeding wants to eat  Objective: Vital signs stable afebrile no acute distress abdomen is soft nontender labs all stable guaiac negative  Assessment: Anemia questionable etiology in patient with cirrhosis  Plan: Will advance diet hopefully can go home soon transfusion per hospital team and plan on outpatient colonoscopy on Wednesday and please call me this week antibiotic to be of any further assistance with this hospital stay and we discussed no more than 4 Tylenol  a day and no aspirin or nonsteroidals long-term  Cutter Passey E  office [phone] After 5PM or if no answer call [phone]

## 2023-06-04 NOTE — Discharge Summary (Addendum)
 Physician Discharge Summary   Patient: Ruben Henry MRN: [id number] DOB: [birth year]  Admit date:     06/02/2023  Discharge date: 06/04/23  Discharge Physician: Junita Oliva   PCP: Sylvia Everts, PA-C   Recommendations at discharge:    Discharge to home Follow up with PCP in 7-10 days. CBC to be drawn at that visit to be reported to PCP. Keep appointment for colonoscopy on Wednesday.  Discharge Diagnoses: Principal Problem:   GI bleed Active Problems:   Low hemoglobin  Resolved Problems:   * No resolved hospital problems. *  Hospital Course: Ruben Henry is a 78 y.o. male with medical history significant of CAD, hypertension, hyperlipidemia who presented to emergency department at the recommendation of his primary care doctor.  He was feeling weak and has had black stools for the last 10 days.  He was scheduled to undergo EGD and colonoscopy for unders anemia in the coming week.  Repeat hemoglobin was checked which showed hemoglobin of 7.3 from 13.82 months ago.  He was referred to the ER where hemoglobin was repeated and found to be 6.9.  He was transfused and admitted.  Labs on admission showed sodium 137, AST 47, INR 1.1.  Patient had chest x-ray on presentation which showed no acute findings.  GI was consulted with plans for EGD in the morning.  Of note he had a colonoscopy approximately 1 year ago.  He was found to have 8 polyps with recommended 1 year follow-up evaluation he endorses prior history of capsule with reported small bowel ulceration.  He states that he was given steroids.  I cannot find record of this in the chart he endorses black stools.    The patient was taken for EGD with Dr. Honey Lusty on 06/03/2023. He was found to have grade II esophageal varicees and evidence of gastritis in the gastric antrum. No bleeding source was observed. The patient's hemoglobin since admission to [facility] has been 7.9>7.1>7.1. Will continue to monitor and transfuse for hemoglobin less than  7.0. The patient is continued on  protonix . Octrotide has been discontinued.  The patient has been cleared for discharge by gastroenterology. His hemoglobin this afternoon is improved at 7.5. He will be discharged to home in fair condition.  Assessment and Plan: Principal Problem:   GI bleed Active Problems:   Low hemoglobin   # Acute on chronic anemia most likely secondary to upper GI bleed - Patient Henry 10 days Ruben with hemoglobin drop from 13-7 - Patient has reported history of ulcerations - CT abdomen pelvis obtained in February demonstrated evidence of hepatic cirrhosis send  --The patient was taken for EGD with Dr. Honey Lusty on 06/03/2023. He was found to have grade II esophageal varicees and evidence of gastritis in the gastric antrum. No bleeding source was observed. The patient's hemoglobin since admission to [facility] has been 7.9>7.1>7.1. Will continue to monitor and transfuse for hemoglobin less than 7.0. The patient is continued on  protonix . Octrotide has been discontinued. --Hemoglobin is improving at 7.5 on the afternoon of 06/04/2023. He has been cleared for discharge by GI. --The patient is to present for colonoscopy as outpatient on 06/09/2023. --He will need to follow up with PCP in 7-10 days and have CBC checked on that visit to be reported to PCP.   # Hyperlipidemia-continue Zetia    # Hypertension-continue losartan        Consultants: Gastroenterology Procedures performed: EGD  Disposition: Home Diet recommendation:  Discharge Diet Orders (From admission, onward)  Start     Ordered   06/04/23 0000  Diet - low sodium heart healthy        06/04/23 1540           Cardiac diet DISCHARGE MEDICATION: Allergies as of 06/04/2023   No Known Allergies      Medication List     STOP taking these medications    Fish Oil 1000 MG Caps   methylPREDNISolone  4 MG tablet Commonly known as: Medrol        TAKE these medications    albuterol  108 (90 Base)  MCG/ACT inhaler Commonly known as: VENTOLIN  HFA USE 2 INHALATIONS BY MOUTH EVERY 6 HOURS AS NEEDED FOR WHEEZING  OR SHORTNESS OF BREATH   azelastine  0.1 % nasal spray Commonly known as: ASTELIN  Place 1 spray into both nostrils 2 (two) times daily. Use in each nostril as directed   ezetimibe  10 MG tablet Commonly known as: ZETIA  Take 1 tablet (10 mg total) by mouth daily. What changed: when to take this   fluticasone  50 MCG/ACT nasal spray Commonly known as: FLONASE  Place 2 sprays into both nostrils daily. What changed:  when to take this reasons to take this   ipratropium 0.02 % nebulizer solution Commonly known as: ATROVENT Take 0.5 mg by nebulization every 4 (four) hours as needed for wheezing or shortness of breath.   Iron  (Ferrous Sulfate ) 325 (65 Fe) MG Tabs 1 tab po bid   levalbuterol 0.63 MG/3ML nebulizer solution Commonly known as: XOPENEX Take 0.63 mg by nebulization every 4 (four) hours as needed for wheezing or shortness of breath.   losartan  25 MG tablet Commonly known as: COZAAR  Take 1 tablet (25 mg total) by mouth daily. What changed: when to take this   pantoprazole  40 MG tablet Commonly known as: Protonix  Take 1 tablet (40 mg total) by mouth 2 (two) times daily.   senna 8.6 MG tablet Commonly known as: SENOKOT Take 1 tablet by mouth See admin instructions. Take 1 tablet by mouth every other night   Trelegy Ellipta  100-62.5-25 MCG/ACT Aepb Generic drug: Fluticasone -Umeclidin-Vilant Inhale 1 puff into the lungs daily.        Discharge Exam: Filed Weights   06/02/23 1345  Weight: 86.2 kg   Exam:  Constitutional:  The patient is awake, alert, and oriented x 3. No acute distress. Respiratory:  No increased work of breathing. No wheezes, rales, or rhonchi No tactile fremitus Cardiovascular:  Regular rate and rhythm No murmurs, ectopy, or gallups. No lateral PMI. No thrills. Abdomen:  Abdomen is soft, non-tender, non-distended No  hernias, masses, or organomegaly Normoactive bowel sounds.  Musculoskeletal:  No cyanosis, clubbing, or edema Skin:  No rashes, lesions, ulcers palpation of skin: no induration or nodules Neurologic:  CN 2-12 intact Sensation all 4 extremities intact Psychiatric:  Mental status Mood, affect appropriate Orientation to person, place, time  judgment and insight appear intact   Condition at discharge: fair  The results of significant diagnostics from this hospitalization (including imaging, microbiology, ancillary and laboratory) are listed below for reference.   Imaging Studies: DG Chest 2 View Result Date: 06/02/2023 CLINICAL DATA:  Mild cough and wheezing. EXAM: CHEST - 2 VIEW COMPARISON:  Chest radiograph dated 03/24/2023. FINDINGS: The heart size and mediastinal contours are unchanged. No focal consolidation, pleural effusion, or pneumothorax. Prior right reverse shoulder arthroplasty. No acute osseous abnormality. IMPRESSION: No acute cardiopulmonary findings. Electronically Signed   By: Mannie Seek M.D.   On: 06/02/2023 12:44    Microbiology:  Results for orders placed or performed in visit on 01/22/23  Fecal occult blood, imunochemical([facility])     Status: None   Collection Time: 01/22/23 11:27 AM   Specimen: Stool  Result Value Ref Range Status   Fecal Occult Bld Negative Negative Final    Labs: CBC: Recent Labs  Lab 06/02/23 1039 06/02/23 1351 06/02/23 2056 06/03/23 0036 06/03/23 0826 06/03/23 1305 06/03/23 1630 06/04/23 0009 06/04/23 0844 06/04/23 1355  WBC 6.8 6.8   < > 6.2 5.4  --  5.0 6.2 6.1  --   NEUTROABS 3.5 3.8  --   --   --   --   --   --   --   --   HGB 7.7 Repeated and verified X2.* 6.9*   < > 7.1* 7.1* 7.2* 7.0* 7.4* 7.2* 7.5*  HCT 23.3 Repeated and verified X2.* 21.0*   < > 22.8* 22.0* 23.9* 22.7* 23.4* 23.4* 24.2*  MCV 103.2* 101.0*   < > 107.5* 104.8*  --  108.1* 107.3* 107.8*  --   PLT 270.0 251   < > 230 242  --  250 255 262   --    < > = values in this interval not displayed.   Basic Metabolic Panel: Recent Labs  Lab 06/02/23 1351 06/02/23 2056 06/04/23 0009  NA 134* 136 136  K 3.7 3.8 3.6  CL 105 105 105  CO2 21* 25 22  GLUCOSE 113* 137* 128*  BUN 14 14 9   CREATININE 0.78 0.79 0.70  CALCIUM 8.3* 8.4* 8.2*   Liver Function Tests: Recent Labs  Lab 06/02/23 1351 06/04/23 0009  AST 47* 50*  ALT 37 41  ALKPHOS 115 89  BILITOT <0.2 0.9  PROT 6.2* 6.2*  ALBUMIN 3.0* 3.1*   CBG: No results for input(s): "GLUCAP" in the last 168 hours.  Discharge time spent: greater than 30 minutes.  Signed: Lyle Niblett, DO [facility] 06/04/2023

## 2023-06-04 NOTE — TOC Transition Note (Signed)
 Transition of Care [facility]) - Discharge Note   Patient Details  Name: Ruben Henry MRN: [id number] Date of Birth: 1945/05/04  Transition of [facility]) CM/SW Contact:  Ruben Corolla, RN Phone Number: 06/04/2023, 3:48 PM   Clinical Narrative:  d/c home No CM needs.     Final next level of care: Home/Self Care Barriers to Discharge: No Barriers Identified   Patient Goals and CMS Choice Patient states their goals for this hospitalization and ongoing recovery are:: Home CMS Medicare.gov Compare Post Acute Care list provided to:: Patient Choice offered to / list presented to : Patient [place] ownership interest in [facility].provided to:: Patient    Discharge Placement                       Discharge Plan and Services Additional resources added to the After Visit Summary for                                       Social Drivers of Health (SDOH) Interventions SDOH Screenings   Food Insecurity: No Food Insecurity (06/02/2023)  Housing: Low Risk  (06/02/2023)  Transportation Needs: No Transportation Needs (06/02/2023)  Utilities: Not At Risk (06/02/2023)  Alcohol Screen: Low Risk  (03/23/2023)  Depression (PHQ2-9): Low Risk  (05/28/2022)  Financial Resource Strain: Low Risk  (03/23/2023)  Physical Activity: Sufficiently Active (03/23/2023)  Social Connections: Unknown (06/02/2023)  Stress: No Stress Concern Present (03/23/2023)  Tobacco Use: Low Risk  (06/03/2023)     Readmission Risk Interventions     No data to display

## 2023-06-07 ENCOUNTER — Telehealth: Payer: Self-pay

## 2023-06-07 NOTE — Transitions of Care (Post Inpatient/ED Visit) (Signed)
   06/07/2023  Name: KORDAE BUONOCORE MRN: [id number] DOB: 04-Aug-1945  Today's TOC FU Call Status: Today's TOC FU Call Status:: Successful TOC FU Call Completed Patient's Name and Date of Birth confirmed.  Transition Care Management Follow-up Telephone Call Discharge Facility: [facility]) Type of Discharge: Inpatient Admission Primary Inpatient Discharge Diagnosis:: GI Bleed How have you been since you were released from the hospital?: Better, still a little weak      Medications Reviewed Today: Medications Reviewed Today     Reviewed by Jamie Mccoy, RN (Registered Nurse) on 06/07/23 at 1311  Med List Status: <None>   Medication Order Taking? Sig Documenting Provider Last Dose Status Informant  albuterol  (VENTOLIN  HFA) 108 (90 Base) MCG/ACT inhaler [id number] Yes USE 2 INHALATIONS BY MOUTH EVERY 6 HOURS AS NEEDED FOR WHEEZING  OR SHORTNESS OF BREATH Saguier, Gaylin Ke, PA-C Taking Active Self  azelastine  (ASTELIN ) 0.1 % nasal spray [id number] No Place 1 spray into both nostrils 2 (two) times daily. Use in each nostril as directed  Patient not taking: Reported on 06/02/2023   Aleck Hurdle, MD Not Taking Active Self  ezetimibe  (ZETIA ) 10 MG tablet [id number] Yes Take 1 tablet (10 mg total) by mouth daily.  Patient taking differently: Take 10 mg by mouth at bedtime.   Saguier, Gaylin Ke, PA-C Taking Active Self  fluticasone  (FLONASE ) 50 MCG/ACT nasal spray [id number] Yes Place 2 sprays into both nostrils daily.  Patient taking differently: Place 2 sprays into both nostrils daily as needed for allergies or rhinitis.   Saguier, Gaylin Ke, PA-C Taking Active Self  Fluticasone -Umeclidin-Vilant (TRELEGY ELLIPTA ) 100-62.5-25 MCG/ACT AEPB [id number] Yes Inhale 1 puff into the lungs daily. Desai, Nikita S, MD Taking Active Self  ipratropium (ATROVENT) 0.02 % nebulizer solution [id number] Yes Take 0.5 mg by nebulization every 4 (four) hours as needed for wheezing or shortness of breath. [provider] Taking Active Self  Iron , Ferrous Sulfate , 325 (65 Fe) MG TABS [id number] No 1 tab po bid  Patient not taking: Reported on 06/02/2023   Saguier, Edward, PA-C Not Taking Active Self  levalbuterol (XOPENEX) 0.63 MG/3ML nebulizer solution [id number] Yes Take 0.63 mg by nebulization every 4 (four) hours as needed for wheezing or shortness of breath. [provider] Taking Active Self  losartan  (COZAAR ) 25 MG tablet [id number] Yes Take 1 tablet (25 mg total) by mouth daily.  Patient taking differently: Take 25 mg by mouth in the morning.   Saguier, Gaylin Ke, PA-C Taking Active Self  pantoprazole  (PROTONIX ) 40 MG tablet [id number] Yes Take 1 tablet (40 mg total) by mouth 2 (two) times daily. Swayze, Ava, DO Taking Active   senna (SENOKOT) 8.6 MG tablet [id number] Yes Take 1 tablet by mouth See admin instructions. Take 1 tablet by mouth every other night [provider] Taking Active Self             Follow up appointments reviewed: AWV 06/08/23 and GI follow up on 06/09/23, start prep tomorrow    SDOH Interventions Today    Flowsheet Row Most Recent Value  SDOH Interventions   Food Insecurity Interventions Intervention Not Indicated  Housing Interventions Intervention Not Indicated  Transportation Interventions Intervention Not Indicated  Utilities Interventions Intervention Not Indicated      [place], RN, BSN, CCM [place]  [facility], [facility] RN Care Manager Direct Dial: [phone]

## 2023-06-08 ENCOUNTER — Ambulatory Visit (INDEPENDENT_AMBULATORY_CARE_PROVIDER_SITE_OTHER): Payer: PPO

## 2023-06-08 VITALS — Ht 70.0 in | Wt 192.0 lb

## 2023-06-08 DIAGNOSIS — Z0000 Encounter for general adult medical examination without abnormal findings: Secondary | ICD-10-CM | POA: Diagnosis not present

## 2023-06-08 NOTE — Patient Instructions (Addendum)
 Mr. Ruben Henry , Thank you for taking time to come for your Medicare Wellness Visit. I appreciate your ongoing commitment to your health goals. Please review the following plan we discussed and let me know if I can assist you in the future.   Referrals/Orders/Follow-Ups/Clinician Recommendations:   This is a list of the screening recommended for you and due dates:  Health Maintenance  Topic Date Due   COVID-19 Vaccine (3 - Moderna risk series) 04/15/2020   Flu Shot  09/17/2023   Medicare Annual Wellness Visit  06/07/2024   DTaP/Tdap/Td vaccine (3 - Td or Tdap) 05/05/2026   Colon Cancer Screening  06/02/2027   Pneumonia Vaccine  Completed   Hepatitis C Screening  Completed   Zoster (Shingles) Vaccine  Completed   HPV Vaccine  Aged Out   Meningitis B Vaccine  Aged Out    Advanced directives: (Declined) Advance directive discussed with you today. Even though you declined this today, please call our office should you change your mind, and we can give you the proper paperwork for you to fill out.  Next Medicare Annual Wellness Visit scheduled for next year: Yes

## 2023-06-08 NOTE — Progress Notes (Signed)
 Subjective:   Ruben Henry is a 78 y.o. who presents for a Medicare Wellness preventive visit.  Visit Complete: Virtual I connected with  Ruben Henry on 06/08/23 by a audio enabled telemedicine application and verified that I am speaking with the correct person using two identifiers.  Patient Location: Home  Provider Location: Home Office  I discussed the limitations of evaluation and management by telemedicine. The patient expressed understanding and agreed to proceed.  Vital Signs: Because this visit was a virtual/telehealth visit, some criteria may be missing or patient reported. Any vitals not documented were not able to be obtained and vitals that have been documented are patient reported.    Persons Participating in Visit: Patient.  AWV Questionnaire: Yes: Patient Medicare AWV questionnaire was completed by the patient on 06/05/23; I have confirmed that all information answered by patient is correct and no changes since this date.  Cardiac Risk Factors include: advanced age (>76men, >98 women);male gender;hypertension     Objective:    Today's Vitals   06/08/23 0811 06/08/23 0812  Weight: 192 lb (87.1 kg)   Height: 5\' 10"  (1.778 m)   PainSc:  0-No pain   Body mass index is 27.55 kg/m.     06/08/2023    8:19 AM 06/02/2023    6:00 PM 06/25/2022    8:39 AM 05/28/2022    8:26 AM 03/19/2022    8:25 AM 05/27/2021    8:28 AM 05/16/2020    8:29 AM  Advanced Directives  Does Patient Have a Medical Advance Directive? No No No No No Yes No  Type of Advance Directive      Living will   Would patient like information on creating a medical advance directive? No - Patient declined No - Patient declined No - Patient declined No - Patient declined  No - Patient declined Yes (MAU/Ambulatory/Procedural Areas - Information given)    Current Medications (verified) Outpatient Encounter Medications as of 06/08/2023  Medication Sig   albuterol  (VENTOLIN  HFA) 108 (90 Base) MCG/ACT  inhaler USE 2 INHALATIONS BY MOUTH EVERY 6 HOURS AS NEEDED FOR WHEEZING  OR SHORTNESS OF BREATH   azelastine  (ASTELIN ) 0.1 % nasal spray Place 1 spray into both nostrils 2 (two) times daily. Use in each nostril as directed (Patient not taking: Reported on 06/02/2023)   ezetimibe  (ZETIA ) 10 MG tablet Take 1 tablet (10 mg total) by mouth daily. (Patient taking differently: Take 10 mg by mouth at bedtime.)   fluticasone  (FLONASE ) 50 MCG/ACT nasal spray Place 2 sprays into both nostrils daily. (Patient taking differently: Place 2 sprays into both nostrils daily as needed for allergies or rhinitis.)   Fluticasone -Umeclidin-Vilant (TRELEGY ELLIPTA ) 100-62.5-25 MCG/ACT AEPB Inhale 1 puff into the lungs daily.   ipratropium (ATROVENT) 0.02 % nebulizer solution Take 0.5 mg by nebulization every 4 (four) hours as needed for wheezing or shortness of breath.   Iron , Ferrous Sulfate , 325 (65 Fe) MG TABS 1 tab po bid (Patient not taking: Reported on 06/02/2023)   levalbuterol (XOPENEX) 0.63 MG/3ML nebulizer solution Take 0.63 mg by nebulization every 4 (four) hours as needed for wheezing or shortness of breath.   losartan  (COZAAR ) 25 MG tablet Take 1 tablet (25 mg total) by mouth daily. (Patient taking differently: Take 25 mg by mouth in the morning.)   pantoprazole  (PROTONIX ) 40 MG tablet Take 1 tablet (40 mg total) by mouth 2 (two) times daily.   senna (SENOKOT) 8.6 MG tablet Take 1 tablet by mouth See admin  instructions. Take 1 tablet by mouth every other night   No facility-administered encounter medications on file as of 06/08/2023.    Allergies (verified) Patient has no known allergies.   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)    brachytherapy   Past Surgical History:  Procedure Laterality Date   CORONARY ANGIOPLASTY WITH STENT PLACEMENT  06/21/1996   prior angioplasty of the RCA with stenting of the LCX in 1998   ESOPHAGOGASTRODUODENOSCOPY N/A 06/03/2023   Procedure: EGD (ESOPHAGOGASTRODUODENOSCOPY);  Surgeon: Baldo Bonds, MD;  Location: [facility];  Service: Gastroenterology;  Laterality: N/A;   EYE SURGERY  1969   Right eye /    TONSILLECTOMY AND ADENOIDECTOMY     Family History  Problem Relation Age of Onset   Heart failure Mother    Esophageal cancer Father        died age 16, exp to asbestos   Cancer Father    Diabetes Sister    Allergies Sister    Heart attack Maternal Uncle 49   Coronary artery disease Maternal Uncle 47       with CABG   Social History   Socioeconomic History   Marital status: Married    Spouse name: Not on file   Number of children: 2   Years of education: Not on file   Highest education level: Bachelor's degree (e.g., BA, AB, BS)  Occupational History   Occupation: Garment/textile technologist: [employer]  Tobacco Use   Smoking status: Never   Smokeless tobacco: Never  Vaping Use   Vaping status: Never Used  Substance and Sexual Activity   Alcohol use: Yes    Alcohol/week: 14.0 standard drinks of alcohol    Types: 14 Cans of beer per week    Comment: couple of beers a day. [employer].   Drug use: No   Sexual activity: Yes  Other Topics Concern   Not on file  Social History Narrative   Not on file   Social Drivers of Health   Financial Resource Strain: Low Risk  (06/08/2023)   Overall Financial Resource Strain (CARDIA)    Difficulty of Paying Living Expenses: Not hard at all  Food Insecurity: No Food Insecurity (06/08/2023)   Hunger Vital Sign    Worried About Running Out of Food in the Last Year: Never true    Ran Out of Food in the Last Year: Never true  Transportation Needs: No  Transportation Needs (06/08/2023)   PRAPARE - Administrator, Civil Service (Medical): No    Lack of Transportation (Non-Medical): No  Physical Activity: Sufficiently Active (06/08/2023)   Exercise Vital Sign    Days of Exercise per Week: 5 days    Minutes of Exercise per Session: 60 min  Stress: No Stress Concern Present (06/08/2023)   [employer] of Occupational Health - Occupational Stress Questionnaire    Feeling of Stress : Not at all  Social Connections: Socially Integrated (06/08/2023)   Social Connection and Isolation Panel [NHANES]    Frequency of Communication with Friends and Family: More than three times a week  Frequency of Social Gatherings with Friends and Family: More than three times a week    Attends Religious Services: More than 4 times per year    Active Member of Clubs or Organizations: Yes    Attends Engineer, structural: More than 4 times per year    Marital Status: Married    Tobacco Counseling Counseling given: Not Answered    Clinical Intake:  Pre-visit preparation completed: Yes  Pain : No/denies pain Pain Score: 0-No pain Faces Pain Scale: No hurt  Faces Pain Scale: No hurt  BMI - recorded: 27.55 Nutritional Status: BMI 25 -29 Overweight Nutritional Risks: None Diabetes: No  Lab Results  Component Value Date   HGBA1C 5.7 09/30/2022     How often do you need to have someone help you when you read instructions, pamphlets, or other written materials from your doctor or pharmacy?: 1 - Never  Interpreter Needed?: No  Information entered by :: Farris Hong LPN   Activities of Daily Living     06/08/2023    8:18 AM 06/05/2023    8:33 PM  In your present state of health, do you have any difficulty performing the following activities:  Hearing? 0 0  Vision? 0 0  Difficulty concentrating or making decisions? 0 0  Walking or climbing stairs? 0 0  Dressing or bathing? 0 0  Doing errands, shopping? 0 0  Preparing  Food and eating ? N N  Using the Toilet? N N  In the past six months, have you accidently leaked urine? N N  Do you have problems with loss of bowel control? N N  Managing your Medications? N N  Managing your Finances? N N  Housekeeping or managing your Housekeeping? N N    Patient Care Team: Saguier, Edward, PA-C as PCP - General (Physician Assistant) [place], Peter M, MD as PCP - Cardiology (Cardiology) [place], Peter M, MD as Consulting Physician (Cardiology) Ricki Charon, MD as Consulting Physician (Ophthalmology) Puschinsky, Kristina Pfeiffer., MD as Consulting Physician (Urology)  Indicate any recent Medical Services you may have received from other than [facility] providers in the past year (date may be approximate).     Assessment:   This is a routine wellness examination for Chase.  Hearing/Vision screen Hearing Screening - Comments:: Denies hearing difficulties   Vision Screening - Comments:: Wears rx glasses - up to date with routine eye exams with  [facility].   Goals Addressed               This Visit's Progress     Increase physical activity (pt-stated)        Remain active.       Depression Screen     06/08/2023    8:17 AM 05/28/2022    8:43 AM 05/27/2021    8:29 AM 05/16/2020    8:36 AM 12/05/2018    9:42 AM 06/11/2017    2:08 PM 11/01/2015   10:38 AM  PHQ 2/9 Scores  PHQ - 2 Score 0 0 0 0 0 0 0    Fall Risk     06/08/2023    8:18 AM 06/05/2023    8:33 PM 05/27/2022    4:08 PM 05/27/2021    8:29 AM 05/16/2020    8:34 AM  Fall Risk   Falls in the past year? 0 0 0 0 0  Number falls in past yr: 0 0 0 0 0  Injury with Fall? 0 0 0 0 0  Risk for fall  due to : No Fall Risks  No Fall Risks No Fall Risks   Follow up Falls prevention discussed;Falls evaluation completed  Falls evaluation completed Falls evaluation completed Falls prevention discussed    MEDICARE RISK AT HOME:  Medicare Risk at Home Any stairs in or around the home?: Yes If so, are there  any without handrails?: No Home free of loose throw rugs in walkways, pet beds, electrical cords, etc?: Yes Adequate lighting in your home to reduce risk of falls?: Yes Life alert?: No Use of a cane, walker or w/c?: No Grab bars in the bathroom?: No Shower chair or bench in shower?: No Elevated toilet seat or a handicapped toilet?: No  TIMED UP AND GO:  Was the test performed?  No  Cognitive Function: 6CIT completed    05/28/2022    8:51 AM 11/01/2015   10:39 AM  MMSE - Mini Mental State Exam  Not completed: Unable to complete   Orientation to time  5  Orientation to Place  5  Registration  3  Attention/ Calculation  5  Recall  3  Language- name 2 objects  2  Language- repeat  1  Language- follow 3 step command  3  Language- read & follow direction  1  Write a sentence  1  Copy design  1  Total score  30        06/08/2023    8:18 AM 05/27/2021    8:36 AM  6CIT Screen  What Year? 0 points 0 points  What month? 0 points 0 points  What time? 0 points 0 points  Count back from 20 0 points 0 points  Months in reverse 0 points 0 points  Repeat phrase 0 points 0 points  Total Score 0 points 0 points    Immunizations Immunization History  Administered Date(s) Administered   Influenza, High Dose Seasonal PF 11/01/2015, 01/30/2017, 01/06/2018, 11/15/2018   Influenza-Unspecified 01/30/2017, 11/15/2018, 01/01/2020, 01/16/2021, 01/02/2022   Moderna Sars-Covid-2 Vaccination 04/10/2019, 03/18/2020   Pneumococcal Conjugate-13 06/08/2013   Pneumococcal Polysaccharide-23 01/27/2012   Td 02/16/2006   Tdap 05/04/2016   Zoster Recombinant(Shingrix) 02/13/2019, 04/14/2019   Zoster, Live 12/09/2009    Screening Tests Health Maintenance  Topic Date Due   COVID-19 Vaccine (3 - Moderna risk series) 04/15/2020   INFLUENZA VACCINE  09/17/2023   Medicare Annual Wellness (AWV)  06/07/2024   DTaP/Tdap/Td (3 - Td or Tdap) 05/05/2026   Colonoscopy  06/02/2027   Pneumonia Vaccine 4+  Years old  Completed   Hepatitis C Screening  Completed   Zoster Vaccines- Shingrix  Completed   HPV VACCINES  Aged Out   Meningococcal B Vaccine  Aged Out    Health Maintenance  Health Maintenance Due  Topic Date Due   COVID-19 Vaccine (3 - Moderna risk series) 04/15/2020   Health Maintenance Items Addressed:    Additional Screening:  Vision Screening: Recommended annual ophthalmology exams for early detection of glaucoma and other disorders of the eye.  Dental Screening: Recommended annual dental exams for proper oral hygiene  Community Resource Referral / Chronic Care Management: CRR required this visit?  No   CCM required this visit?  No     Plan:     I have personally reviewed and noted the following in the patient's chart:   Medical and social history Use of alcohol, tobacco or illicit drugs  Current medications and supplements including opioid prescriptions. Patient is not currently taking opioid prescriptions. Functional ability and status Nutritional status Physical  activity Advanced directives List of other physicians Hospitalizations, surgeries, and ER visits in previous 12 months Vitals Screenings to include cognitive, depression, and falls Referrals and appointments  In addition, I have reviewed and discussed with patient certain preventive protocols, quality metrics, and best practice recommendations. A written personalized care plan for preventive services as well as general preventive health recommendations were provided to patient.     Dewayne Ford, LPN   1/61/0960   After Visit Summary: (MyChart) Due to this being a telephonic visit, the after visit summary with patients personalized plan was offered to patient via MyChart   Notes: Nothing significant to report at this time.

## 2023-06-09 DIAGNOSIS — D123 Benign neoplasm of transverse colon: Secondary | ICD-10-CM | POA: Diagnosis not present

## 2023-06-09 DIAGNOSIS — Z860101 Personal history of adenomatous and serrated colon polyps: Secondary | ICD-10-CM | POA: Diagnosis not present

## 2023-06-09 DIAGNOSIS — K6389 Other specified diseases of intestine: Secondary | ICD-10-CM | POA: Diagnosis not present

## 2023-06-09 DIAGNOSIS — Z09 Encounter for follow-up examination after completed treatment for conditions other than malignant neoplasm: Secondary | ICD-10-CM | POA: Diagnosis not present

## 2023-06-09 LAB — HM COLONOSCOPY

## 2023-06-14 ENCOUNTER — Other Ambulatory Visit: Payer: Self-pay | Admitting: Family

## 2023-06-14 DIAGNOSIS — D649 Anemia, unspecified: Secondary | ICD-10-CM

## 2023-06-14 DIAGNOSIS — K922 Gastrointestinal hemorrhage, unspecified: Secondary | ICD-10-CM

## 2023-06-15 ENCOUNTER — Inpatient Hospital Stay: Attending: Hematology & Oncology

## 2023-06-15 ENCOUNTER — Inpatient Hospital Stay

## 2023-06-15 ENCOUNTER — Ambulatory Visit (HOSPITAL_BASED_OUTPATIENT_CLINIC_OR_DEPARTMENT_OTHER)
Admission: RE | Admit: 2023-06-15 | Discharge: 2023-06-15 | Disposition: A | Discharge status: Home / Self Care | Source: Ambulatory Visit | Attending: Family

## 2023-06-15 ENCOUNTER — Encounter: Payer: Self-pay | Admitting: Family

## 2023-06-15 ENCOUNTER — Inpatient Hospital Stay: Admitting: Family

## 2023-06-15 VITALS — BP 124/73 | HR 86 | Temp 98.2°F | Resp 18 | Ht 70.0 in | Wt 197.8 lb

## 2023-06-15 DIAGNOSIS — M47819 Spondylosis without myelopathy or radiculopathy, site unspecified: Secondary | ICD-10-CM | POA: Diagnosis not present

## 2023-06-15 DIAGNOSIS — R770 Abnormality of albumin: Secondary | ICD-10-CM | POA: Insufficient documentation

## 2023-06-15 DIAGNOSIS — C9000 Multiple myeloma not having achieved remission: Secondary | ICD-10-CM

## 2023-06-15 DIAGNOSIS — D509 Iron deficiency anemia, unspecified: Secondary | ICD-10-CM | POA: Diagnosis not present

## 2023-06-15 DIAGNOSIS — K922 Gastrointestinal hemorrhage, unspecified: Secondary | ICD-10-CM

## 2023-06-15 DIAGNOSIS — Z96611 Presence of right artificial shoulder joint: Secondary | ICD-10-CM | POA: Diagnosis not present

## 2023-06-15 DIAGNOSIS — D649 Anemia, unspecified: Secondary | ICD-10-CM

## 2023-06-15 LAB — CBC WITH DIFFERENTIAL (CANCER CENTER ONLY)
Abs Immature Granulocytes: 0.01 K/uL (ref 0.00–0.07)
Basophils Absolute: 0.1 K/uL (ref 0.0–0.1)
Basophils Relative: 1 %
Eosinophils Absolute: 0.5 K/uL (ref 0.0–0.5)
Eosinophils Relative: 10 %
HCT: 23.5 % — ABNORMAL LOW (ref 39.0–52.0)
Hemoglobin: 7.4 g/dL — ABNORMAL LOW (ref 13.0–17.0)
Immature Granulocytes: 0 %
Lymphocytes Relative: 24 %
Lymphs Abs: 1.2 K/uL (ref 0.7–4.0)
MCH: 31.1 pg (ref 26.0–34.0)
MCHC: 31.5 g/dL (ref 30.0–36.0)
MCV: 98.7 fL (ref 80.0–100.0)
Monocytes Absolute: 0.9 K/uL (ref 0.1–1.0)
Monocytes Relative: 18 %
Neutro Abs: 2.3 K/uL (ref 1.7–7.7)
Neutrophils Relative %: 47 %
Platelet Count: 272 K/uL (ref 150–400)
RBC: 2.38 MIL/uL — ABNORMAL LOW (ref 4.22–5.81)
RDW: 17.6 % — ABNORMAL HIGH (ref 11.5–15.5)
Smear Review: NORMAL
WBC Count: 4.9 K/uL (ref 4.0–10.5)
nRBC: 0 % (ref 0.0–0.2)

## 2023-06-15 LAB — SAMPLE TO BLOOD BANK

## 2023-06-15 LAB — CMP (CANCER CENTER ONLY)
ALT: 27 U/L (ref 0–44)
AST: 30 U/L (ref 15–41)
Albumin: 2.8 g/dL — ABNORMAL LOW (ref 3.5–5.0)
Alkaline Phosphatase: 97 U/L (ref 38–126)
Anion gap: 10 (ref 5–15)
BUN: 7 mg/dL — ABNORMAL LOW (ref 8–23)
CO2: 22 mmol/L (ref 22–32)
Calcium: 8.2 mg/dL — ABNORMAL LOW (ref 8.9–10.3)
Chloride: 106 mmol/L (ref 98–111)
Creatinine: 0.85 mg/dL (ref 0.61–1.24)
GFR, Estimated: >60 mL/min
Glucose, Bld: 118 mg/dL — ABNORMAL HIGH (ref 70–99)
Potassium: 4 mmol/L (ref 3.5–5.1)
Sodium: 138 mmol/L (ref 135–145)
Total Bilirubin: 0.7 mg/dL (ref 0.0–1.2)
Total Protein: 6.4 g/dL — ABNORMAL LOW (ref 6.5–8.1)

## 2023-06-15 LAB — IRON AND IRON BINDING CAPACITY (CC-WL,HP ONLY)
Iron: 15 ug/dL — ABNORMAL LOW (ref 45–182)
Saturation Ratios: 3 % — ABNORMAL LOW (ref 17.9–39.5)
TIBC: 560 ug/dL — ABNORMAL HIGH (ref 250–450)
UIBC: 545 ug/dL — ABNORMAL HIGH (ref 117–376)

## 2023-06-15 LAB — RETICULOCYTES
Immature Retic Fract: 30.8 % — ABNORMAL HIGH (ref 2.3–15.9)
RBC.: 2.4 MIL/uL — ABNORMAL LOW (ref 4.22–5.81)
Retic Count, Absolute: 135.5 10*3/uL (ref 19.0–186.0)
Retic Ct Pct: 5.7 % — ABNORMAL HIGH (ref 0.4–3.1)

## 2023-06-15 LAB — SAVE SMEAR(SSMR), FOR PROVIDER SLIDE REVIEW

## 2023-06-15 LAB — FERRITIN: Ferritin: 20 ng/mL — ABNORMAL LOW (ref 24–336)

## 2023-06-15 LAB — LACTATE DEHYDROGENASE: LDH: 151 U/L (ref 98–192)

## 2023-06-15 NOTE — Progress Notes (Signed)
 Hematology/Oncology Consultation   Name: Ruben Henry      MRN: [id number]    Location: Room/bed info not found  Date: 06/15/2023 Time:8:56 AM   REFERRING PHYSICIAN: Sylvia Everts, PA-C  REASON FOR CONSULT: Anemia, low ferritin level    DIAGNOSIS:  Persistent anemia IDA  HISTORY OF PRESENT ILLNESS: Ruben Henry is a pleasant 78 yo caucasian gentleman with persistent anemia since December 2024 with intermittent iron  deficiency. Hgb is stable at 7.4, MCV 98, platelets 272 and WBC count 4.9. Differential is unremarkable.  Albumin and total protein are decreased on CMP.  He was hospitalized 2 weeks ago with anemia secondary to GI bleed. He had an EGD during admission which showed grade II esophageal varices and gastritis in the gastric antrum treated with Octreotide  and now he is home on Protonix .  His colonoscopy last year showed diverticulosis within the descending and sigmoid colon as well as removal of 8 polyps from throughout the colon some of which were hyperplastic. He had non bleeding internal hemorrhoids. He also has history of small bowel ulceration noted with capsule endoscopy.  He has not required transfusional support so far. Thankfully his symptoms remain minimal.  No obvious blood loss noted.  No abnormal bruising, no petechiae noted.  He has stayed quite active walking 2 miles most days as well as playing golf regularly.  He is taking an oral iron  supplement daily. Iron  studies are pending.  His Hgb, so far, fails to improve.  Hepatic steatosis noted on recent CT. Chest xray was negative.   No personal past history of cancer. His sister had metastatic breast.  No history of diabetes or thyroid  disease.  He has a right reverse shoulder replacement and has had some muscle atrophy. His ROM is limited and he is unable to lift his arm above chest height. He states that he has been referred to a specialist to address these issues.  No fever, chills, n/v, cough, rash, chest pain,  palpitations, abdominal pain or changes in bowel or bladder habits.  No swelling, numbness or tingling in his extremities at this time.  No falls or syncope reported.  Appetite and hydration are good. Patient describes weight of 197 stable.  No smoking or recreational drug use.  ETOH socially, wine or beer.   ROS: All other 10 point review of systems is negative.   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   CAD (coronary artery disease) 07/20/2013   s/p remote PCI of RCA and LCX in 1998   Hypercholesterolemia    Hypertension    Low hemoglobin 06/02/2023   Osteoarthritis of right hip    Prostate cancer (HCC)    brachytherapy    ALLERGIES: No Known Allergies    MEDICATIONS:  Current Outpatient Medications on File Prior to Visit  Medication Sig Dispense Refill   albuterol  (VENTOLIN  HFA) 108 (90 Base) MCG/ACT inhaler USE 2 INHALATIONS BY MOUTH EVERY 6 HOURS AS NEEDED FOR WHEEZING  OR SHORTNESS OF BREATH 34 g 2   azelastine  (ASTELIN ) 0.1 % nasal spray Place 1 spray into both nostrils 2 (two) times daily. Use in each nostril as directed (Patient not taking: Reported on 06/02/2023) 30 mL 5   ezetimibe  (ZETIA ) 10 MG tablet Take 1 tablet (10 mg total) by mouth daily. (Patient taking differently: Take 10 mg by mouth at bedtime.) 90 tablet 3  fluticasone  (FLONASE ) 50 MCG/ACT nasal spray Place 2 sprays into both nostrils daily. (Patient taking differently: Place 2 sprays into both nostrils daily as needed for allergies or rhinitis.) 16 g 2   Fluticasone -Umeclidin-Vilant (TRELEGY ELLIPTA ) 100-62.5-25 MCG/ACT AEPB Inhale 1 puff into the lungs daily. 3 each 3   ipratropium (ATROVENT) 0.02 % nebulizer solution Take 0.5 mg by nebulization every 4 (four) hours as needed for wheezing or shortness of breath.     Iron , Ferrous Sulfate , 325 (65 Fe) MG TABS 1 tab po bid (Patient  not taking: Reported on 06/02/2023) 60 tablet 1   levalbuterol (XOPENEX) 0.63 MG/3ML nebulizer solution Take 0.63 mg by nebulization every 4 (four) hours as needed for wheezing or shortness of breath.     losartan  (COZAAR ) 25 MG tablet Take 1 tablet (25 mg total) by mouth daily. (Patient taking differently: Take 25 mg by mouth in the morning.) 90 tablet 3   pantoprazole  (PROTONIX ) 40 MG tablet Take 1 tablet (40 mg total) by mouth 2 (two) times daily. 30 tablet 1   senna (SENOKOT) 8.6 MG tablet Take 1 tablet by mouth See admin instructions. Take 1 tablet by mouth every other night     No current facility-administered medications on file prior to visit.     PAST SURGICAL HISTORY Past Surgical History:  Procedure Laterality Date   CORONARY ANGIOPLASTY WITH STENT PLACEMENT  06/21/1996   prior angioplasty of the RCA with stenting of the LCX in 1998   ESOPHAGOGASTRODUODENOSCOPY N/A 06/03/2023   Procedure: EGD (ESOPHAGOGASTRODUODENOSCOPY);  Surgeon: Baldo Bonds, MD;  Location: [facility];  Service: Gastroenterology;  Laterality: N/A;   EYE SURGERY  1969   Right eye /    TONSILLECTOMY AND ADENOIDECTOMY      FAMILY HISTORY: Family History  Problem Relation Age of Onset   Heart failure Mother    Esophageal cancer Father        died age 46, exp to asbestos   Cancer Father    Diabetes Sister    Allergies Sister    Heart attack Maternal Uncle 17   Coronary artery disease Maternal Uncle 71       with CABG    SOCIAL HISTORY:  reports that he has never smoked. He has never used smokeless tobacco. He reports current alcohol use of about 14.0 standard drinks of alcohol per week. He reports that he does not use drugs.  PERFORMANCE STATUS: The patient's performance status is 1 - Symptomatic but completely ambulatory  PHYSICAL EXAM: Most Recent Vital Signs: There were no vitals taken for this visit. BP 124/73 (BP Location: Left Arm, Patient Position: Sitting, Cuff Size: Normal)   Pulse  86   Temp 98.2 F (36.8 C) (Oral)   Resp 18   Ht 5\' 10"  (1.778 m)   Wt 197 lb 12.8 oz (89.7 kg)   SpO2 99%   BMI 28.38 kg/m   General Appearance:    Alert, cooperative, no distress, appears stated age  Head:    Normocephalic, without obvious abnormality, atraumatic  Eyes:    PERRL, conjunctiva/corneas clear, EOM's intact, fundi    benign, both eyes             Throat:   Lips, mucosa, and tongue normal; teeth and gums normal  Neck:   Supple, symmetrical, trachea midline, no adenopathy;       thyroid :  No enlargement/tenderness/nodules; no carotid   bruit or JVD  Back:     Symmetric, no curvature, ROM normal, no CVA  tenderness  Lungs:     Clear to auscultation bilaterally, respirations unlabored  Chest wall:    No tenderness or deformity  Heart:    Regular rate and rhythm, S1 and S2 normal, no murmur, rub   or gallop  Abdomen:     Soft, non-tender, bowel sounds active all four quadrants,    no masses, no organomegaly        Extremities:   Extremities normal, atraumatic, no cyanosis or edema  Pulses:   2+ and symmetric all extremities  Skin:   Skin color, texture, turgor normal, no rashes or lesions  Lymph nodes:   Cervical, supraclavicular, and axillary nodes normal  Neurologic:   CNII-XII intact. Normal strength, sensation and reflexes      throughout    LABORATORY DATA:  No results found for this or any previous visit (from the past 48 hours).    RADIOGRAPHY: No results found.     PATHOLOGY: None   ASSESSMENT/PLAN: Mr. Dworaczyk is a pleasant 78 yo caucasian gentleman with persistent anemia since December 2024 with intermittent iron  deficiency. CBC with diff and blood smear reviewed at length with Dr. Maria Shiner. Patient is noted to have plasma cells on smear as well as decreased albumin on CMP concerning for possible Myeloma. This was discussed in detail with the patient and he is agreeable to moving forward with some more testing to confirm.  We will add myeloma studies to  serum labs and he has supplies and directions for collecting a 24 hr urine.  Bone survey and bone marrow biopsy also ordered.  No transfusion needed at this time.  Epo and iron  studies pending. We will treat as needed.  Follow-up with MD in 3 weeks.   All questions were answered. The patient knows to call the clinic with any problems, questions or concerns. We can certainly see the patient much sooner if necessary.  The patient was discussed with Dr. Maria Shiner and he is in agreement with the aforementioned.   Kennard Pea, NP

## 2023-06-16 LAB — KAPPA/LAMBDA LIGHT CHAINS
Kappa free light chain: 25.6 mg/L — ABNORMAL HIGH (ref 3.3–19.4)
Kappa, lambda light chain ratio: 1.1 (ref 0.26–1.65)
Lambda free light chains: 23.2 mg/L (ref 5.7–26.3)

## 2023-06-16 LAB — IGG, IGA, IGM
IgA: 351 mg/dL (ref 61–437)
IgG (Immunoglobin G), Serum: 1227 mg/dL (ref 603–1613)
IgM (Immunoglobulin M), Srm: 67 mg/dL (ref 15–143)

## 2023-06-16 LAB — ERYTHROPOIETIN: Erythropoietin: 164.3 m[IU]/mL — ABNORMAL HIGH (ref 2.6–18.5)

## 2023-06-17 ENCOUNTER — Encounter: Payer: Self-pay | Admitting: Family

## 2023-06-17 ENCOUNTER — Inpatient Hospital Stay: Attending: Hematology & Oncology

## 2023-06-17 DIAGNOSIS — C9000 Multiple myeloma not having achieved remission: Secondary | ICD-10-CM

## 2023-06-17 DIAGNOSIS — K922 Gastrointestinal hemorrhage, unspecified: Secondary | ICD-10-CM | POA: Insufficient documentation

## 2023-06-17 DIAGNOSIS — D500 Iron deficiency anemia secondary to blood loss (chronic): Secondary | ICD-10-CM | POA: Diagnosis not present

## 2023-06-17 LAB — PROTEIN ELECTROPHORESIS, SERUM
A/G Ratio: 0.9 (ref 0.7–1.7)
Albumin ELP: 3 g/dL (ref 2.9–4.4)
Alpha-1-Globulin: 0.3 g/dL (ref 0.0–0.4)
Alpha-2-Globulin: 0.6 g/dL (ref 0.4–1.0)
Beta Globulin: 1 g/dL (ref 0.7–1.3)
Gamma Globulin: 1.3 g/dL (ref 0.4–1.8)
Globulin, Total: 3.2 g/dL (ref 2.2–3.9)
Total Protein ELP: 6.2 g/dL (ref 6.0–8.5)

## 2023-06-18 ENCOUNTER — Encounter: Payer: Self-pay | Admitting: *Deleted

## 2023-06-18 ENCOUNTER — Other Ambulatory Visit: Payer: Self-pay | Admitting: Family

## 2023-06-22 ENCOUNTER — Other Ambulatory Visit: Payer: Self-pay | Admitting: Family

## 2023-06-22 DIAGNOSIS — C9000 Multiple myeloma not having achieved remission: Secondary | ICD-10-CM

## 2023-06-24 LAB — UPEP/UIFE/LIGHT CHAINS/TP, 24-HR UR
% BETA, Urine: 0 %
ALPHA 1 URINE: 0 %
Albumin, U: 100 %
Alpha 2, Urine: 0 %
Free Kappa Lt Chains,Ur: 13.46 mg/L (ref 1.17–86.46)
Free Kappa/Lambda Ratio: 5.02 (ref 1.83–14.26)
Free Lambda Lt Chains,Ur: 2.68 mg/L (ref 0.27–15.21)
GAMMA GLOBULIN URINE: 0 %
Total Protein, Urine-Ur/day: 117 mg/(24.h) (ref 30–150)
Total Protein, Urine: 7.3 mg/dL
Total Volume: 1600

## 2023-06-25 ENCOUNTER — Other Ambulatory Visit: Payer: Self-pay | Admitting: Gastroenterology

## 2023-06-25 ENCOUNTER — Inpatient Hospital Stay

## 2023-06-25 VITALS — BP 141/73 | HR 75 | Temp 98.1°F | Resp 17

## 2023-06-25 DIAGNOSIS — D649 Anemia, unspecified: Secondary | ICD-10-CM

## 2023-06-25 DIAGNOSIS — K7460 Unspecified cirrhosis of liver: Secondary | ICD-10-CM | POA: Diagnosis not present

## 2023-06-25 DIAGNOSIS — D500 Iron deficiency anemia secondary to blood loss (chronic): Secondary | ICD-10-CM | POA: Diagnosis not present

## 2023-06-25 MED ORDER — SODIUM CHLORIDE 0.9 % IV SOLN: INTRAVENOUS | Status: DC

## 2023-06-25 MED ORDER — SODIUM CHLORIDE 0.9 % IV SOLN
1000.0000 mg | Freq: Once | INTRAVENOUS | Status: AC
  Administered 2023-06-25: 1000 mg via INTRAVENOUS
  Filled 2023-06-25: qty 10

## 2023-06-25 NOTE — Patient Instructions (Signed)

## 2023-06-29 ENCOUNTER — Other Ambulatory Visit: Payer: Self-pay

## 2023-06-29 DIAGNOSIS — Z8546 Personal history of malignant neoplasm of prostate: Secondary | ICD-10-CM

## 2023-06-30 ENCOUNTER — Inpatient Hospital Stay

## 2023-06-30 DIAGNOSIS — D500 Iron deficiency anemia secondary to blood loss (chronic): Secondary | ICD-10-CM | POA: Diagnosis not present

## 2023-06-30 DIAGNOSIS — Z8546 Personal history of malignant neoplasm of prostate: Secondary | ICD-10-CM

## 2023-06-30 LAB — TYPE AND SCREEN
ABO/RH(D): A POS
Antibody Screen: NEGATIVE

## 2023-06-30 LAB — CBC WITH DIFFERENTIAL (CANCER CENTER ONLY)
Abs Immature Granulocytes: 0.04 10*3/uL (ref 0.00–0.07)
Basophils Absolute: 0.1 10*3/uL (ref 0.0–0.1)
Basophils Relative: 1 %
Eosinophils Absolute: 0.4 10*3/uL (ref 0.0–0.5)
Eosinophils Relative: 6 %
HCT: 28 % — ABNORMAL LOW (ref 39.0–52.0)
Hemoglobin: 8.6 g/dL — ABNORMAL LOW (ref 13.0–17.0)
Immature Granulocytes: 1 %
Lymphocytes Relative: 16 %
Lymphs Abs: 1.1 10*3/uL (ref 0.7–4.0)
MCH: 30.4 pg (ref 26.0–34.0)
MCHC: 30.7 g/dL (ref 30.0–36.0)
MCV: 98.9 fL (ref 80.0–100.0)
Monocytes Absolute: 1.2 10*3/uL — ABNORMAL HIGH (ref 0.1–1.0)
Monocytes Relative: 18 %
Neutro Abs: 3.9 10*3/uL (ref 1.7–7.7)
Neutrophils Relative %: 58 %
Platelet Count: 262 10*3/uL (ref 150–400)
RBC: 2.83 MIL/uL — ABNORMAL LOW (ref 4.22–5.81)
RDW: 19.9 % — ABNORMAL HIGH (ref 11.5–15.5)
WBC Count: 6.8 10*3/uL (ref 4.0–10.5)
nRBC: 0 % (ref 0.0–0.2)

## 2023-07-04 ENCOUNTER — Other Ambulatory Visit: Payer: Self-pay | Admitting: Interventional Radiology

## 2023-07-04 DIAGNOSIS — Z01818 Encounter for other preprocedural examination: Secondary | ICD-10-CM

## 2023-07-05 NOTE — Progress Notes (Signed)
 Patient for CT Bone Marrow Biopsy on Tues 07/06/23, I called and spoke with the patient on the phone and gave pre-procedure instructions. Pt was made aware to be here at 7:30a, NPO after MN prior to procedure as well as driver post procedure/recovery/discharge. Pt stated understanding.  Called 07/05/23

## 2023-07-06 ENCOUNTER — Other Ambulatory Visit: Payer: Self-pay

## 2023-07-06 ENCOUNTER — Ambulatory Visit
Admission: RE | Admit: 2023-07-06 | Discharge: 2023-07-06 | Disposition: A | Discharge status: Home / Self Care | Source: Ambulatory Visit | Attending: Family | Admitting: Family

## 2023-07-06 DIAGNOSIS — Z1379 Encounter for other screening for genetic and chromosomal anomalies: Secondary | ICD-10-CM | POA: Diagnosis not present

## 2023-07-06 DIAGNOSIS — E8809 Other disorders of plasma-protein metabolism, not elsewhere classified: Secondary | ICD-10-CM | POA: Diagnosis not present

## 2023-07-06 DIAGNOSIS — D649 Anemia, unspecified: Secondary | ICD-10-CM | POA: Diagnosis not present

## 2023-07-06 DIAGNOSIS — Z01818 Encounter for other preprocedural examination: Secondary | ICD-10-CM

## 2023-07-06 DIAGNOSIS — D508 Other iron deficiency anemias: Secondary | ICD-10-CM | POA: Insufficient documentation

## 2023-07-06 DIAGNOSIS — I8500 Esophageal varices without bleeding: Secondary | ICD-10-CM | POA: Diagnosis not present

## 2023-07-06 DIAGNOSIS — Z79899 Other long term (current) drug therapy: Secondary | ICD-10-CM | POA: Diagnosis not present

## 2023-07-06 DIAGNOSIS — D539 Nutritional anemia, unspecified: Secondary | ICD-10-CM | POA: Diagnosis not present

## 2023-07-06 DIAGNOSIS — D7589 Other specified diseases of blood and blood-forming organs: Secondary | ICD-10-CM | POA: Insufficient documentation

## 2023-07-06 DIAGNOSIS — R5383 Other fatigue: Secondary | ICD-10-CM | POA: Insufficient documentation

## 2023-07-06 DIAGNOSIS — D509 Iron deficiency anemia, unspecified: Secondary | ICD-10-CM | POA: Diagnosis present

## 2023-07-06 LAB — CBC WITH DIFFERENTIAL/PLATELET
Abs Immature Granulocytes: 0.02 10*3/uL (ref 0.00–0.07)
Basophils Absolute: 0.1 10*3/uL (ref 0.0–0.1)
Basophils Relative: 2 %
Eosinophils Absolute: 0.6 10*3/uL — ABNORMAL HIGH (ref 0.0–0.5)
Eosinophils Relative: 14 %
HCT: 33.4 % — ABNORMAL LOW (ref 39.0–52.0)
Hemoglobin: 10.1 g/dL — ABNORMAL LOW (ref 13.0–17.0)
Immature Granulocytes: 0 %
Lymphocytes Relative: 21 %
Lymphs Abs: 1 10*3/uL (ref 0.7–4.0)
MCH: 30.4 pg (ref 26.0–34.0)
MCHC: 30.2 g/dL (ref 30.0–36.0)
MCV: 100.6 fL — ABNORMAL HIGH (ref 80.0–100.0)
Monocytes Absolute: 0.7 10*3/uL (ref 0.1–1.0)
Monocytes Relative: 15 %
Neutro Abs: 2.3 10*3/uL (ref 1.7–7.7)
Neutrophils Relative %: 48 %
Platelets: 204 10*3/uL (ref 150–400)
RBC: 3.32 MIL/uL — ABNORMAL LOW (ref 4.22–5.81)
RDW: 21 % — ABNORMAL HIGH (ref 11.5–15.5)
WBC: 4.6 10*3/uL (ref 4.0–10.5)
nRBC: 0 % (ref 0.0–0.2)

## 2023-07-06 LAB — PROTIME-INR
INR: 1.1 (ref 0.8–1.2)
Prothrombin Time: 14 s (ref 11.4–15.2)

## 2023-07-06 MED ORDER — FENTANYL CITRATE (PF) 100 MCG/2ML IJ SOLN
INTRAMUSCULAR | Status: AC
  Filled 2023-07-06: qty 2

## 2023-07-06 MED ORDER — FENTANYL CITRATE (PF) 100 MCG/2ML IJ SOLN
INTRAMUSCULAR | Status: AC | PRN
  Administered 2023-07-06: 50 ug via INTRAVENOUS

## 2023-07-06 MED ORDER — HEPARIN SOD (PORK) LOCK FLUSH 100 UNIT/ML IV SOLN
INTRAVENOUS | Status: AC
  Filled 2023-07-06: qty 5

## 2023-07-06 MED ORDER — SODIUM CHLORIDE 0.9 % IV SOLN: INTRAVENOUS | Status: DC

## 2023-07-06 MED ORDER — MIDAZOLAM HCL 2 MG/2ML IJ SOLN
INTRAMUSCULAR | Status: AC
Start: 2023-07-06 — End: 2023-07-06
  Filled 2023-07-06: qty 2

## 2023-07-06 MED ORDER — MIDAZOLAM HCL 5 MG/5ML IJ SOLN
INTRAMUSCULAR | Status: AC | PRN
  Administered 2023-07-06: 1 mg via INTRAVENOUS

## 2023-07-06 NOTE — H&P (Signed)
 Chief Complaint: Patient was seen in consultation today for persistent anemia with plasma cells on smear   Procedure: Bone marrow biopsy and aspiration  Referring Physician(s): Tish Forge  Supervising Physician: Erica Hau  Patient Status: [facility] - Out-pt  History of Present Illness: Ruben Henry is a 78 y.o. male with a history of persistent anemia and intermittent iron  deficiency since 01/2023. Patient was recently hospitalized with anemia secondary to a GI bleed. On 4/17 he underwent an EGD with Dr. Honey Lusty and was found to have grade II esophageal varices with evidence of gastritis. He was treated with Octreotide  and sent home on protonix . He was subsequently referred to Heme/Onc for further anemia workup. His Hgb remains stable at ~7-8. Patient was noted to have plasma cells on recent blood smear as well as decreased albumin on CMP concerning for possible Myeloma. Patient referred to IR for bone marrow biopsy for continued workup.  Currently, patient is resting in bed. Admits to fatigue and intermittent lightheadedness that has since improved since is iron  infusion a week ago. Continues to have difficulty ambulating long distances due to fatigue. Patient is otherwise without complaints. Denies any chest pain, shortness of breath, fevers/chills. NPO since midnight. VSS. Labs pending. All questions and concerns answered at the bedside.   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   CAD (coronary artery disease) 07/20/2013   s/p remote PCI of RCA and LCX in 1998   Hypercholesterolemia    Hypertension    Low hemoglobin 06/02/2023   Osteoarthritis of right hip    Prostate cancer [facility])    brachytherapy    Past Surgical History:  Procedure Laterality Date   CORONARY ANGIOPLASTY WITH STENT PLACEMENT  06/21/1996   prior angioplasty of the RCA  with stenting of the LCX in 1998   ESOPHAGOGASTRODUODENOSCOPY N/A 06/03/2023   Procedure: EGD (ESOPHAGOGASTRODUODENOSCOPY);  Surgeon: Baldo Bonds, MD;  Location: [facility];  Service: Gastroenterology;  Laterality: N/A;   EYE SURGERY  1969   Right eye /    TONSILLECTOMY AND ADENOIDECTOMY      Allergies: Patient has no known allergies.  Medications: Prior to Admission medications   Medication Sig Start Date End Date Taking? Authorizing Provider  albuterol  (VENTOLIN  HFA) 108 (90 Base) MCG/ACT inhaler USE 2 INHALATIONS BY MOUTH EVERY 6 HOURS AS NEEDED FOR WHEEZING  OR SHORTNESS OF BREATH 01/25/23   Saguier, Gaylin Ke, PA-C  azelastine  (ASTELIN ) 0.1 % nasal spray Place 1 spray into both nostrils 2 (two) times daily. Use in each nostril as directed Patient not taking: Reported on 06/02/2023 10/14/22   Aleck Hurdle, MD  ezetimibe  (ZETIA ) 10 MG tablet Take 1 tablet (10 mg total) by mouth daily. Patient taking differently: Take 10 mg by mouth at bedtime. 03/09/23   Saguier, Gaylin Ke, PA-C  fluticasone  (FLONASE ) 50 MCG/ACT nasal spray Place 2 sprays into both nostrils daily. Patient taking differently: Place 2 sprays into both nostrils daily as needed for allergies or rhinitis. 12/20/20   Saguier, Gaylin Ke, PA-C  Fluticasone -Umeclidin-Vilant (TRELEGY ELLIPTA ) 100-62.5-25 MCG/ACT AEPB Inhale 1 puff into the lungs daily. 04/19/23   Desai, Nikita S, MD  ipratropium (ATROVENT) 0.02 % nebulizer solution Take 0.5 mg by nebulization every 4 (four) hours as needed for wheezing or shortness of breath.    [provider]  Iron , Ferrous Sulfate , 325 (65 Fe) MG TABS 1 tab  po bid 01/21/23   Saguier, Gaylin Ke, PA-C  levalbuterol (XOPENEX) 0.63 MG/3ML nebulizer solution Take 0.63 mg by nebulization every 4 (four) hours as needed for wheezing or shortness of breath.    [provider]  losartan  (COZAAR ) 25 MG tablet Take 1 tablet (25 mg total) by mouth daily. Patient taking differently: Take 25 mg by  mouth in the morning. 03/09/23   Saguier, Gaylin Ke, PA-C  pantoprazole  (PROTONIX ) 40 MG tablet Take 1 tablet (40 mg total) by mouth 2 (two) times daily. 06/04/23 06/03/24  Swayze, Ava, DO  senna (SENOKOT) 8.6 MG tablet Take 1 tablet by mouth See admin instructions. Take 1 tablet by mouth every other night    [provider]     Family History  Problem Relation Age of Onset   Heart failure Mother    Esophageal cancer Father        died age 16, exp to asbestos   Cancer Father    Diabetes Sister    Allergies Sister    Heart attack Maternal Uncle 59   Coronary artery disease Maternal Uncle 25       with CABG    Social History   Socioeconomic History   Marital status: Married    Spouse name: Not on file   Number of children: 2   Years of education: Not on file   Highest education level: Bachelor's degree (e.g., BA, AB, BS)  Occupational History   Occupation: Garment/textile technologist: [employer]  Tobacco Use   Smoking status: Never   Smokeless tobacco: Never  Vaping Use   Vaping status: Never Used  Substance and Sexual Activity   Alcohol use: Yes    Alcohol/week: 14.0 standard drinks of alcohol    Types: 14 Cans of beer per week    Comment: couple of beers a day. [employer].   Drug use: No   Sexual activity: Yes  Other Topics Concern   Not on file  Social History Narrative   Not on file   Social Drivers of Health   Financial Resource Strain: Low Risk  (06/08/2023)   Overall Financial Resource Strain (CARDIA)    Difficulty of Paying Living Expenses: Not hard at all  Food Insecurity: No Food Insecurity (06/08/2023)   Hunger Vital Sign    Worried About Running Out of Food in the Last Year: Never true    Ran Out of Food in the Last Year: Never true  Transportation Needs: No Transportation Needs (06/08/2023)   PRAPARE - Administrator, Civil Service (Medical): No    Lack of Transportation (Non-Medical): No  Physical Activity: Sufficiently Active  (06/08/2023)   Exercise Vital Sign    Days of Exercise per Week: 5 days    Minutes of Exercise per Session: 60 min  Stress: No Stress Concern Present (06/08/2023)   [employer] of Occupational Health - Occupational Stress Questionnaire    Feeling of Stress : Not at all  Social Connections: Socially Integrated (06/08/2023)   Social Connection and Isolation Panel [NHANES]    Frequency of Communication with Friends and Family: More than three times a week    Frequency of Social Gatherings with Friends and Family: More than three times a week    Attends Religious Services: More than 4 times per year    Active Member of [employer] or Organizations: Yes    Attends Engineer, structural: More than 4 times per year    Marital Status: Married  Review of Systems  Constitutional:  Positive for fatigue.  Neurological:  Positive for light-headedness (intermittent).  Denies any N/V, chest pain, shortness of breath, fevers/chills. All other ROS negative.  Vital Signs: BP (!) 158/85   Pulse 67   Temp 97.9 F (36.6 C) (Oral)   Resp 13   Ht 5\' 10"  (1.778 m)   Wt 193 lb (87.5 kg)   SpO2 97%   BMI 27.69 kg/m    Physical Exam Vitals reviewed.  Constitutional:      Appearance: Normal appearance.  HENT:     Head: Normocephalic and atraumatic.     Mouth/Throat:     Mouth: Mucous membranes are moist.     Pharynx: Oropharynx is clear.  Cardiovascular:     Rate and Rhythm: Normal rate and regular rhythm.     Heart sounds: Normal heart sounds.  Pulmonary:     Effort: Pulmonary effort is normal.     Comments: Congestion of bilateral upper lobes Abdominal:     General: Abdomen is flat.     Palpations: Abdomen is soft.     Tenderness: There is no abdominal tenderness.  Musculoskeletal:        General: Normal range of motion.     Cervical back: Normal range of motion.  Skin:    General: Skin is warm and dry.  Neurological:     Mental Status: He is alert and oriented to person,  place, and time. Mental status is at baseline.  Psychiatric:        Mood and Affect: Mood normal.        Behavior: Behavior normal.    Imaging: DG Bone Survey Met Result Date: 06/29/2023 CLINICAL DATA:  Workup for myeloma versus leukemia. Multiple myeloma, remission status unspecified. EXAM: METASTATIC BONE SURVEY COMPARISON:  No prior bone surveys available. FINDINGS: No evidence of lytic or destructive bone lesion. Bilateral hip arthroplasty and reverse right shoulder arthroplasty. Brachytherapy seeds in the region of the prostate. Two clips project over the right hemithorax. Mild degenerative change throughout the spine. IMPRESSION: No evidence of lytic or destructive bone lesion. Electronically Signed   By: Chadwick Colonel M.D.   On: 06/29/2023 15:26    Labs:  CBC: Recent Labs    06/04/23 0009 06/04/23 0844 06/04/23 1355 06/15/23 0844 06/30/23 1429  WBC 6.2 6.1  --  4.9 6.8  HGB 7.4* 7.2* 7.5* 7.4* 8.6*  HCT 23.4* 23.4* 24.2* 23.5* 28.0*  PLT 255 262  --  272 262    COAGS: Recent Labs    06/02/23 1439  INR 1.1    BMP: Recent Labs    06/02/23 1351 06/02/23 2056 06/04/23 0009 06/15/23 0844  NA 134* 136 136 138  K 3.7 3.8 3.6 4.0  CL 105 105 105 106  CO2 21* 25 22 22   GLUCOSE 113* 137* 128* 118*  BUN 14 14 9  7*  CALCIUM 8.3* 8.4* 8.2* 8.2*  CREATININE 0.78 0.79 0.70 0.85  GFRNONAA >60 >60 >60 >60    LIVER FUNCTION TESTS: Recent Labs    03/24/23 1141 06/02/23 1351 06/04/23 0009 06/15/23 0844  BILITOT 1.0 <0.2 0.9 0.7  AST 47* 47* 50* 30  ALT 35 37 41 27  ALKPHOS 123* 115 89 97  PROT 7.6 6.2* 6.2* 6.4*  ALBUMIN 4.3 3.0* 3.1* 2.8*    TUMOR MARKERS: No results for input(s): "AFPTM", "CEA", "CA199", "CHROMGRNA" in the last 8760 hours.  Assessment and Plan:  Persistent anemia with plasma cells on blood smear: Helga Loan  Criado is a 78 y.o. male with a history of persistent anemia with intermittent iron  deficiency, recently found to have plasma cells  on blood smear who presents to [facility] Interventional Radiology department for an image-guided bone marrow biopsy with Dr. Nereida Banning on 06/18/23. Procedure to be performed under moderate sedation.  -NPO since midnight -Labs pending -Plan for CT bone marrow biopsy with Dr. Nereida Banning  Risks and benefits of bone marrow biopsy was discussed with the patient and/or patient's family including, but not limited to bleeding, infection, damage to adjacent structures or low yield requiring additional tests.  All of the questions were answered and there is agreement to proceed.  Consent signed and in chart.   Thank you for this interesting consult. I greatly enjoyed meeting Ruben Henry and look forward to participating in their care. A copy of this report was sent to the requesting provider on this date.  Electronically Signed: Orson Blalock, PA-C 07/06/2023, 8:26 AM   I spent a total of  15 Minutes in face to face clinical consultation, greater than 50% of which was counseling/coordinating care for bone marrow biopsy.

## 2023-07-06 NOTE — Procedures (Signed)
 Interventional Radiology Procedure Note  Procedure: CT guided bone marrow aspiration and biopsy  Complications: None  EBL: < 10 mL  Findings: Aspirate and core biopsy performed of bone marrow in right iliac bone.  Plan: Bedrest supine x 1 hrs  Ruben Henry, M.D Pager:  [phone]

## 2023-07-06 NOTE — Progress Notes (Signed)
 Patient clinically stable post CT BMB per Dr Nereida Banning, tolerated well. Vitals stable pre and post procedure. Received  Versed  1 mg along with Fentanyl  50 mcg IV for procedure. Report given to Graydon Lazier RN post procedure/specials/17

## 2023-07-07 ENCOUNTER — Ambulatory Visit: Admitting: Hematology & Oncology

## 2023-07-07 ENCOUNTER — Inpatient Hospital Stay

## 2023-07-09 LAB — SURGICAL PATHOLOGY

## 2023-07-14 ENCOUNTER — Other Ambulatory Visit: Payer: Self-pay

## 2023-07-14 DIAGNOSIS — D649 Anemia, unspecified: Secondary | ICD-10-CM

## 2023-07-14 DIAGNOSIS — K922 Gastrointestinal hemorrhage, unspecified: Secondary | ICD-10-CM

## 2023-07-15 ENCOUNTER — Ambulatory Visit: Payer: Self-pay | Admitting: Hematology & Oncology

## 2023-07-15 ENCOUNTER — Other Ambulatory Visit: Payer: Self-pay

## 2023-07-15 ENCOUNTER — Encounter: Payer: Self-pay | Admitting: Hematology & Oncology

## 2023-07-15 ENCOUNTER — Inpatient Hospital Stay: Admitting: Hematology & Oncology

## 2023-07-15 ENCOUNTER — Inpatient Hospital Stay

## 2023-07-15 VITALS — BP 139/79 | HR 73 | Temp 98.1°F | Resp 18 | Ht 70.0 in | Wt 192.0 lb

## 2023-07-15 DIAGNOSIS — D500 Iron deficiency anemia secondary to blood loss (chronic): Secondary | ICD-10-CM | POA: Diagnosis not present

## 2023-07-15 DIAGNOSIS — K922 Gastrointestinal hemorrhage, unspecified: Secondary | ICD-10-CM | POA: Diagnosis not present

## 2023-07-15 DIAGNOSIS — D649 Anemia, unspecified: Secondary | ICD-10-CM

## 2023-07-15 LAB — CMP (CANCER CENTER ONLY)
ALT: 27 U/L (ref 0–44)
AST: 43 U/L — ABNORMAL HIGH (ref 15–41)
Albumin: 4.4 g/dL (ref 3.5–5.0)
Alkaline Phosphatase: 119 U/L (ref 38–126)
Anion gap: 7 (ref 5–15)
BUN: 10 mg/dL (ref 8–23)
CO2: 27 mmol/L (ref 22–32)
Calcium: 9.2 mg/dL (ref 8.9–10.3)
Chloride: 104 mmol/L (ref 98–111)
Creatinine: 0.78 mg/dL (ref 0.61–1.24)
GFR, Estimated: >60 mL/min (ref >60)
Glucose, Bld: 122 mg/dL — ABNORMAL HIGH (ref 70–99)
Potassium: 4.1 mmol/L (ref 3.5–5.1)
Sodium: 138 mmol/L (ref 135–145)
Total Bilirubin: 0.9 mg/dL (ref 0.0–1.2)
Total Protein: 7.2 g/dL (ref 6.5–8.1)

## 2023-07-15 LAB — FERRITIN: Ferritin: 108 ng/mL (ref 24–336)

## 2023-07-15 LAB — CBC WITH DIFFERENTIAL (CANCER CENTER ONLY)
Abs Immature Granulocytes: 0.02 10*3/uL (ref 0.00–0.07)
Basophils Absolute: 0.1 10*3/uL (ref 0.0–0.1)
Basophils Relative: 1 %
Eosinophils Absolute: 0.5 10*3/uL (ref 0.0–0.5)
Eosinophils Relative: 9 %
HCT: 38 % — ABNORMAL LOW (ref 39.0–52.0)
Hemoglobin: 11.7 g/dL — ABNORMAL LOW (ref 13.0–17.0)
Immature Granulocytes: 0 %
Lymphocytes Relative: 17 %
Lymphs Abs: 1.1 10*3/uL (ref 0.7–4.0)
MCH: 30.1 pg (ref 26.0–34.0)
MCHC: 30.8 g/dL (ref 30.0–36.0)
MCV: 97.7 fL (ref 80.0–100.0)
Monocytes Absolute: 1.1 10*3/uL — ABNORMAL HIGH (ref 0.1–1.0)
Monocytes Relative: 17 %
Neutro Abs: 3.5 10*3/uL (ref 1.7–7.7)
Neutrophils Relative %: 56 %
Platelet Count: 266 10*3/uL (ref 150–400)
RBC: 3.89 MIL/uL — ABNORMAL LOW (ref 4.22–5.81)
RDW: 19 % — ABNORMAL HIGH (ref 11.5–15.5)
WBC Count: 6.3 10*3/uL (ref 4.0–10.5)
nRBC: 0 % (ref 0.0–0.2)

## 2023-07-15 LAB — IRON AND IRON BINDING CAPACITY (CC-WL,HP ONLY)
Iron: 57 ug/dL (ref 45–182)
Saturation Ratios: 10 % — ABNORMAL LOW (ref 17.9–39.5)
TIBC: 554 ug/dL — ABNORMAL HIGH (ref 250–450)
UIBC: 497 ug/dL — ABNORMAL HIGH (ref 117–376)

## 2023-07-16 ENCOUNTER — Encounter (HOSPITAL_COMMUNITY): Payer: Self-pay | Admitting: Hematology & Oncology

## 2023-07-17 ENCOUNTER — Encounter: Payer: Self-pay | Admitting: Family

## 2023-07-17 NOTE — Progress Notes (Signed)
 Hematology and Oncology Follow Up Visit  Ruben Henry [id number] 1945-07-08 78 y.o. 07/17/2023   Principle Diagnosis:  Iron  deficiency anemia-GI bleed  Current Therapy:   IV iron -Monoferric  given 06/25/2023      Interim History:  Ruben Henry is back for follow-up.  This is first time of seeing him.  He was seen previously.  I had looked at his blood smear was worried that there may be an underlying plasma cell disorder.  We did go ahead and get a bone marrow biopsy on him.  This was done on 07/06/2023.  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  There is no evidence of a plasma cell disorder.  There was no monoclonal population of cells.  The cytogenetics were normal.  I am happy about this.  He responded well to the Monoferric .  When we initially seen him, his ferritin was 20 with an iron  saturation of 3%.  Today, his iron  saturation is only 10%.  He is feeling better.  He still has little bit of fatigue.  He has had no obvious bleeding.  I told he and his wife that again it looks like the anemia was secondary to him having a GI bleeding.  Of note, when he had endoscopic evaluation, he did have some varices.  I know he is going to follow-up with gastroenterology.  He has had no melena or bright red blood per rectum.  His appetite is doing okay.    He has had no cough.  Has had no rashes.  There has been no leg swelling.  He has had no fever.  He has had no headache.  There has been no ecchymoses or petechia.  Currently, I with him that his performance status is probably ECOG 1.   Medications:  Current Outpatient Medications:    albuterol  (VENTOLIN  HFA) 108 (90 Base) MCG/ACT inhaler, USE 2 INHALATIONS BY MOUTH EVERY 6 HOURS AS NEEDED FOR WHEEZING  OR SHORTNESS OF BREATH, Disp: 34 g, Rfl: 2   ezetimibe  (ZETIA ) 10 MG tablet, Take 1 tablet (10 mg total) by mouth daily. (Patient taking differently: Take 10  mg by mouth at bedtime.), Disp: 90 tablet, Rfl: 3   fluticasone  (FLONASE ) 50 MCG/ACT nasal spray, Place 2 sprays into both nostrils daily. (Patient taking differently: Place 2 sprays into both nostrils daily as needed for allergies or rhinitis.), Disp: 16 g, Rfl: 2   Fluticasone -Umeclidin-Vilant (TRELEGY ELLIPTA ) 100-62.5-25 MCG/ACT AEPB, Inhale 1 puff into the lungs daily., Disp: 3 each, Rfl: 3   ipratropium (ATROVENT) 0.02 % nebulizer solution, Take 0.5 mg by nebulization every 4 (four) hours as needed for wheezing or shortness of breath., Disp: , Rfl:    levalbuterol (XOPENEX) 0.63 MG/3ML nebulizer solution, Take 0.63 mg by nebulization every 4 (four) hours as needed for wheezing or shortness of breath., Disp: , Rfl:    losartan  (COZAAR ) 25 MG tablet, Take 1 tablet (25 mg total) by mouth daily. (Patient taking differently: Take 25 mg by mouth in the morning.), Disp: 90 tablet, Rfl: 3   pantoprazole  (PROTONIX ) 40 MG tablet, Take 1 tablet (40 mg total) by mouth 2 (two) times daily., Disp: 30 tablet, Rfl: 1   senna (SENOKOT) 8.6 MG tablet, Take 1 tablet by mouth See admin instructions. Take 1 tablet by mouth every other night, Disp: , Rfl:   Allergies: No Known Allergies  Past Medical History, Surgical history, Social history, and Family History were reviewed and updated.  Review of Systems:  Review of Systems  Constitutional:  Positive for fatigue.  HENT:  Negative.    Eyes: Negative.   Respiratory: Negative.    Cardiovascular: Negative.   Gastrointestinal: Negative.   Endocrine: Negative.   Genitourinary: Negative.    Musculoskeletal: Negative.   Skin: Negative.   Neurological: Negative.   Hematological: Negative.   Psychiatric/Behavioral: Negative.      Physical Exam:  height is 5\' 10"  (1.778 m) and weight is 192 lb (87.1 kg). His oral temperature is 98.1 F (36.7 C). His blood pressure is 139/79 and his pulse is 73. His respiration is 18 and oxygen saturation is 95%.   Wt  Readings from Last 3 Encounters:  07/15/23 192 lb (87.1 kg)  07/06/23 193 lb (87.5 kg)  06/15/23 197 lb 12.8 oz (89.7 kg)    Physical Exam Vitals reviewed.  HENT:     Head: Normocephalic and atraumatic.  Eyes:     Pupils: Pupils are equal, round, and reactive to light.  Cardiovascular:     Rate and Rhythm: Normal rate and regular rhythm.     Heart sounds: Normal heart sounds.  Pulmonary:     Effort: Pulmonary effort is normal.     Breath sounds: Normal breath sounds.  Abdominal:     General: Bowel sounds are normal.     Palpations: Abdomen is soft.  Musculoskeletal:        General: No tenderness or deformity. Normal range of motion.     Cervical back: Normal range of motion.  Lymphadenopathy:     Cervical: No cervical adenopathy.  Skin:    General: Skin is warm and dry.     Findings: No erythema or rash.  Neurological:     Mental Status: He is alert and oriented to person, place, and time.  Psychiatric:        Behavior: Behavior normal.        Thought Content: Thought content normal.        Judgment: Judgment normal.      Lab Results  Component Value Date   WBC 6.3 07/15/2023   HGB 11.7 (L) 07/15/2023   HCT 38.0 (L) 07/15/2023   MCV 97.7 07/15/2023   PLT 266 07/15/2023     Chemistry      Component Value Date/Time   NA 138 07/15/2023 1034   NA 134 01/31/2021 1108   K 4.1 07/15/2023 1034   CL 104 07/15/2023 1034   CO2 27 07/15/2023 1034   BUN 10 07/15/2023 1034   BUN 12 01/31/2021 1108   CREATININE 0.78 07/15/2023 1034      Component Value Date/Time   CALCIUM 9.2 07/15/2023 1034   ALKPHOS 119 07/15/2023 1034   AST 43 (H) 07/15/2023 1034   ALT 27 07/15/2023 1034   BILITOT 0.9 07/15/2023 1034      Olivia's peripheral blood smear.  He has some hypochromic microcytic red blood cells.  I see no nucleated red blood cells.  There is no teardrop cells.  There are no schistocytes.  I see no rouleaux formation.  White blood cells appear normal in morphology and  maturation.  He has no hypersegmented polys.  I do not see any plasma cells.  There is no blasts.  Platelets are adequate in number and size.  Platelets are well granulated.   Impression and Plan: Ruben Henry is a very nice 78 year old white male.  Thankfully, everything is okay with respect to the bone marrow.  The bone marrow is the ultimate diagnostic test in  my opinion for him.  Clearly, he is still iron  deficient.  His hemoglobin has improved which is nice to see.  We are going give him another dose of iron .  We decided to follow-up.  Again he is going to follow-up with Gastroenterology.  We would like to probably see him back in about 3 months.   Ivor Mars, MD 5/31/20256:26 AM

## 2023-07-19 ENCOUNTER — Encounter (HOSPITAL_COMMUNITY): Payer: Self-pay | Admitting: Hematology & Oncology

## 2023-07-19 DIAGNOSIS — G5601 Carpal tunnel syndrome, right upper limb: Secondary | ICD-10-CM | POA: Diagnosis not present

## 2023-07-22 ENCOUNTER — Other Ambulatory Visit (HOSPITAL_COMMUNITY)

## 2023-07-27 ENCOUNTER — Inpatient Hospital Stay: Attending: Hematology & Oncology

## 2023-07-27 VITALS — BP 126/76 | HR 81 | Temp 97.9°F | Resp 20

## 2023-07-27 DIAGNOSIS — C9000 Multiple myeloma not having achieved remission: Secondary | ICD-10-CM

## 2023-07-27 DIAGNOSIS — K922 Gastrointestinal hemorrhage, unspecified: Secondary | ICD-10-CM | POA: Insufficient documentation

## 2023-07-27 DIAGNOSIS — D500 Iron deficiency anemia secondary to blood loss (chronic): Secondary | ICD-10-CM | POA: Diagnosis not present

## 2023-07-27 DIAGNOSIS — Z8546 Personal history of malignant neoplasm of prostate: Secondary | ICD-10-CM

## 2023-07-27 DIAGNOSIS — D649 Anemia, unspecified: Secondary | ICD-10-CM

## 2023-07-27 MED ORDER — SODIUM CHLORIDE 0.9 % IV SOLN: INTRAVENOUS | Status: DC

## 2023-07-27 MED ORDER — SODIUM CHLORIDE 0.9 % IV SOLN
1000.0000 mg | Freq: Once | INTRAVENOUS | Status: AC
  Administered 2023-07-27: 1000 mg via INTRAVENOUS
  Filled 2023-07-27: qty 10

## 2023-07-27 NOTE — Patient Instructions (Signed)

## 2023-08-10 DIAGNOSIS — M542 Cervicalgia: Secondary | ICD-10-CM | POA: Diagnosis not present

## 2023-08-10 DIAGNOSIS — M5030 Other cervical disc degeneration, unspecified cervical region: Secondary | ICD-10-CM | POA: Diagnosis not present

## 2023-08-10 DIAGNOSIS — C61 Malignant neoplasm of prostate: Secondary | ICD-10-CM | POA: Diagnosis not present

## 2023-08-10 DIAGNOSIS — G5601 Carpal tunnel syndrome, right upper limb: Secondary | ICD-10-CM | POA: Diagnosis not present

## 2023-08-10 DIAGNOSIS — M79601 Pain in right arm: Secondary | ICD-10-CM | POA: Diagnosis not present

## 2023-08-10 DIAGNOSIS — Z5189 Encounter for other specified aftercare: Secondary | ICD-10-CM | POA: Diagnosis not present

## 2023-08-11 ENCOUNTER — Telehealth: Payer: Self-pay | Admitting: *Deleted

## 2023-08-11 DIAGNOSIS — J309 Allergic rhinitis, unspecified: Secondary | ICD-10-CM

## 2023-08-11 NOTE — Telephone Encounter (Signed)
 Has already had labs. Does not need additional labs. Fine to continue trelegy

## 2023-08-11 NOTE — Telephone Encounter (Signed)
 He is overdue for follow up - was supposed to be seen back in February for 6 month follow up. Per my last note. 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 I communicated with him about this through mychart.   Please ensure he is doing the below and schedule for follow up.  Continue symbicort  160 2 puffs twice daily with albuterol  as needed Let's try astelin  nasal spray to help with your nasal congestion. You can also add an anti-histamine like zyrtec, xyzal , claritin which are over the counter.  Will obtain allergy  testing with region 2 allergy  panel.

## 2023-08-11 NOTE — Telephone Encounter (Addendum)
 Called patient.  Reviewed information per Dr. Meade.  Ins paying for Trelegy, not Symbicort .     Dr Meade,  Please note if this is the correct lab test: Regional Panel 2  (correct one?) Resp Allergen Profile Regn 2  [place]. (Test that was previously done on 11/05/2022)  Please advise. Orders pending below, choose correct one. Notify patient when can go to lab.

## 2023-08-11 NOTE — Telephone Encounter (Signed)
 Copied from CRM [id number]. Topic: Appointments - Scheduling Inquiry for Clinic >> Jul 28, 2023  1:43 PM Ruben Henry wrote: Reason for CRM: pt would like to go over allergy  test results that Dr Meade ordered.  In addition, pt has mucus buildup every night, so until about lunch it he coughs it up and it comes up. He is willing to see anyone at this point. [phone] Pt leaving out of town through 08/03/2023.  I called and spoke with patient, he is having mucous is yellow and sometimes it is thick and has a hard time getting it up.  He is ok for an hour or two.  He has taken mucinex OTC with no relief.  He has a flutter valve, but has not been using recently.  Advised to start using it again.  He has had the cough for a year or so, he has just never gotten rid of it.  He denies that it has gotten worse lately in the past few months or weeks.  He says he has been seeing us  for a cough for a while.  Dr. Meade, Please advise regarding results of allergy  testing and recommendations for thick yellow mucous that is hard to cough up.  Thank you.

## 2023-08-12 NOTE — Telephone Encounter (Signed)
Pt notified on VM. 

## 2023-08-17 DIAGNOSIS — G5601 Carpal tunnel syndrome, right upper limb: Secondary | ICD-10-CM | POA: Diagnosis not present

## 2023-08-23 DIAGNOSIS — M542 Cervicalgia: Secondary | ICD-10-CM | POA: Diagnosis not present

## 2023-08-23 DIAGNOSIS — G540 Brachial plexus disorders: Secondary | ICD-10-CM | POA: Diagnosis not present

## 2023-08-26 DIAGNOSIS — M79601 Pain in right arm: Secondary | ICD-10-CM | POA: Diagnosis not present

## 2023-08-26 DIAGNOSIS — Z96611 Presence of right artificial shoulder joint: Secondary | ICD-10-CM | POA: Diagnosis not present

## 2023-08-26 DIAGNOSIS — M5030 Other cervical disc degeneration, unspecified cervical region: Secondary | ICD-10-CM | POA: Diagnosis not present

## 2023-08-30 ENCOUNTER — Ambulatory Visit (INDEPENDENT_AMBULATORY_CARE_PROVIDER_SITE_OTHER): Admitting: Medical

## 2023-08-30 ENCOUNTER — Encounter: Payer: Self-pay | Admitting: Medical

## 2023-08-30 ENCOUNTER — Ambulatory Visit (HOSPITAL_BASED_OUTPATIENT_CLINIC_OR_DEPARTMENT_OTHER)
Admission: RE | Admit: 2023-08-30 | Discharge: 2023-08-30 | Disposition: A | Discharge status: Home / Self Care | Source: Ambulatory Visit | Attending: Medical | Admitting: Medical

## 2023-08-30 VITALS — BP 129/71 | HR 70 | Temp 97.9°F | Resp 16 | Ht 70.0 in | Wt 187.2 lb

## 2023-08-30 DIAGNOSIS — R5383 Other fatigue: Secondary | ICD-10-CM | POA: Diagnosis not present

## 2023-08-30 DIAGNOSIS — R252 Cramp and spasm: Secondary | ICD-10-CM | POA: Diagnosis not present

## 2023-08-30 DIAGNOSIS — R059 Cough, unspecified: Secondary | ICD-10-CM | POA: Diagnosis not present

## 2023-08-30 DIAGNOSIS — R062 Wheezing: Secondary | ICD-10-CM | POA: Diagnosis not present

## 2023-08-30 DIAGNOSIS — K921 Melena: Secondary | ICD-10-CM

## 2023-08-30 DIAGNOSIS — D649 Anemia, unspecified: Secondary | ICD-10-CM

## 2023-08-30 DIAGNOSIS — Z862 Personal history of diseases of the blood and blood-forming organs and certain disorders involving the immune mechanism: Secondary | ICD-10-CM

## 2023-08-30 DIAGNOSIS — R0989 Other specified symptoms and signs involving the circulatory and respiratory systems: Secondary | ICD-10-CM | POA: Diagnosis not present

## 2023-08-30 DIAGNOSIS — Z8719 Personal history of other diseases of the digestive system: Secondary | ICD-10-CM | POA: Diagnosis not present

## 2023-08-30 DIAGNOSIS — R058 Other specified cough: Secondary | ICD-10-CM | POA: Diagnosis not present

## 2023-08-30 DIAGNOSIS — Z96611 Presence of right artificial shoulder joint: Secondary | ICD-10-CM | POA: Diagnosis not present

## 2023-08-30 LAB — CBC WITH DIFFERENTIAL/PLATELET
Basophils Absolute: 0.1 K/uL (ref 0.0–0.1)
Basophils Relative: 1 % (ref 0.0–3.0)
Eosinophils Absolute: 0.8 K/uL — ABNORMAL HIGH (ref 0.0–0.7)
Eosinophils Relative: 14.4 % — ABNORMAL HIGH (ref 0.0–5.0)
HCT: 43.7 % (ref 39.0–52.0)
Hemoglobin: 14.7 g/dL (ref 13.0–17.0)
Lymphocytes Relative: 17.1 % (ref 12.0–46.0)
Lymphs Abs: 1 K/uL (ref 0.7–4.0)
MCHC: 33.6 g/dL (ref 30.0–36.0)
MCV: 95 fl (ref 78.0–100.0)
Monocytes Absolute: 0.8 K/uL (ref 0.1–1.0)
Monocytes Relative: 14.1 % — ABNORMAL HIGH (ref 3.0–12.0)
Neutro Abs: 3.1 K/uL (ref 1.4–7.7)
Neutrophils Relative %: 53.4 % (ref 43.0–77.0)
Platelets: 191 K/uL (ref 150.0–400.0)
RBC: 4.6 Mil/uL (ref 4.22–5.81)
RDW: 21.9 % — ABNORMAL HIGH (ref 11.5–15.5)
WBC: 5.9 K/uL (ref 4.0–10.5)

## 2023-08-30 MED ORDER — BENZONATATE 100 MG PO CAPS: 100.0000 mg | ORAL_CAPSULE | Freq: Three times a day (TID) | ORAL | 0 refills | Status: AC | PRN

## 2023-08-30 NOTE — Patient Instructions (Addendum)
 Anemia(hx of) Chronic anemia with recent exacerbation. Hemoglobin improved to 13.8 in past but symptoms suggest ongoing GI bleeding. Stool test positive for blood. Previous bone marrow biopsy negative for multiple myeloma. - Order CBC, iron  panel, and metabolic panel including magnesium  stat. - Perform stool test for occult blood. - Follow up on hemoglobin, hematocrit, and iron  levels. - Consider referral to GI specialist based on test results.  Esophageal Varices Grade 2 varices noted on previous EGD. Potential source of intermittent GI bleeding contributing to anemia. No active bleeding observed. - Consider GI referral for further evaluation if anemia persists.  Asthma with productive cough and rough breath sounds on exam. Asthma with wheezing and productive cough. Symptoms may be exacerbated by postnasal drip and possible silent reflux. Current medications include Trelegy, albuterol , and Atrovent. - Order chest x-ray to evaluate for pneumonia. - Consider prescribing azithromycin  or another antibiotic based on chest x-ray results. - Prescribe Xyzal  for nighttime use to manage postnasal drip.  Possible Pneumonia Productive cough with yellow mucus, nasal congestion, and wheezing suggest possible pneumonia. - Order chest x-ray. - Consider antibiotic treatment based on x-ray findings.  General Health Maintenance Managing multiple chronic conditions including asthma and anemia. Recent liver function tests show minor elevation. Scheduled for follow-up liver scan in July. - Continue current medications and management for chronic conditions. - Follow up on liver scan results in July.  Follow up date to be determined after lab and imaging review.

## 2023-08-30 NOTE — Progress Notes (Signed)
 Subjective:    Patient ID: Ruben Henry, male    DOB: Apr 14, 1945, 78 y.o.   MRN: [id number]  HPI  Ruben Henry is a 78 year old male with anemia who presents with concerns about recurrent blood loss and fatigue.  He has a history of low hemoglobin levels, initially discovered during a routine blood test, which led to an emergency room visit two months ago due to a hemoglobin level of 7.4. He underwent an endoscopy and a colonoscopy, both of which did not reveal any source of bleeding.  Work up later done by hematologist. A bone marrow biopsy was also performed, which was negative for multiple myeloma.  He has received two iron  transfusions, one on May 9th and another on June 10th. The first transfusion improved his symptoms, but the second did not have the same effect. His hemoglobin was 13.8 during a recent test on June 24th, but he has since experienced dark stools, which he associates with blood loss.  He experiences fatigue and shortness of breath with exertion, which improves with rest. He has a history of asthma, for which he uses Trelegy and carries an albuterol  inhaler. He also reports a chronic cough with yellow mucus production for the past six months, which he attributes to postnasal drip and possible allergies.  He has a history of esophageal varices noted during a previous endoscopy. He has been on Protonix , which was prescribed for a month, and he has a history of taking Prilosec for acid reflux before being on protonix .  No history of smoking.       Review of Systems  Constitutional:  Positive for fatigue.  HENT:  Positive for congestion.        Pnd.  Respiratory:  Positive for cough, shortness of breath and wheezing. Negative for chest tightness.        See hpi.  Cardiovascular:  Negative for chest pain and palpitations.  Gastrointestinal:  Positive for blood in stool. Negative for abdominal pain, diarrhea, nausea and vomiting.       Recent described  black stools.  Genitourinary:  Negative for dysuria and frequency.  Musculoskeletal:  Negative for back pain.  Skin:  Negative for rash.  Neurological:  Negative for dizziness, speech difficulty, weakness and light-headedness.  Hematological:  Negative for adenopathy.  Psychiatric/Behavioral:  Negative for behavioral problems and hallucinations.     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)    brachytherapy     Social History   Socioeconomic History   Marital status: Married    Spouse name: Not on file   Number of children: 2   Years of education: Not on file   Highest education level: Bachelor's degree (e.g., BA, AB, BS)  Occupational History   Occupation: Garment/textile technologist: [employer]  Tobacco Use   Smoking status: Never   Smokeless tobacco: Never  Vaping Use   Vaping status: Never Used  Substance and Sexual Activity   Alcohol use: Yes    Alcohol/week: 14.0 standard drinks of alcohol    Types: 14 Cans of beer per week    Comment:  couple of beers a day. Coors Light.   Drug use: No   Sexual activity: Yes  Other Topics Concern   Not on file  Social History Narrative   Not on file   Social Drivers of Health   Financial Resource Strain: Low Risk  (06/08/2023)   Overall Financial Resource Strain (CARDIA)    Difficulty of Paying Living Expenses: Not hard at all  Food Insecurity: No Food Insecurity (06/08/2023)   Hunger Vital Sign    Worried About Running Out of Food in the Last Year: Never true    Ran Out of Food in the Last Year: Never true  Transportation Needs: No Transportation Needs (06/08/2023)   PRAPARE - Scientist, research (physical sciences) (Medical): No    Lack of Transportation (Non-Medical): No  Physical Activity: Sufficiently Active (06/08/2023)   Exercise Vital Sign    Days of Exercise per Week: 5 days    Minutes of Exercise per Session: 60 min  Stress: No Stress Concern Present (06/08/2023)   [employer] of Occupational Health - Occupational Stress Questionnaire    Feeling of Stress : Not at all  Social Connections: Socially Integrated (06/08/2023)   Social Connection and Isolation Panel    Frequency of Communication with Friends and Family: More than three times a week    Frequency of Social Gatherings with Friends and Family: More than three times a week    Attends Religious Services: More than 4 times per year    Active Member of [employer] or Organizations: Yes    Attends Engineer, structural: More than 4 times per year    Marital Status: Married  Catering manager Violence: Not At Risk (06/08/2023)   Humiliation, Afraid, Rape, and Kick questionnaire    Fear of Current or Ex-Partner: No    Emotionally Abused: No    Physically Abused: No    Sexually Abused: No    Past Surgical History:  Procedure Laterality Date   CORONARY ANGIOPLASTY WITH STENT PLACEMENT  06/21/1996   prior angioplasty of the RCA with stenting of the LCX in 1998   ESOPHAGOGASTRODUODENOSCOPY N/A 06/03/2023   Procedure: EGD (ESOPHAGOGASTRODUODENOSCOPY);  Surgeon: Dianna Specking, MD;  Location: [facility];  Service: Gastroenterology;  Laterality: N/A;   EYE SURGERY  1969   Right eye /    TONSILLECTOMY AND ADENOIDECTOMY      Family History  Problem Relation Age of Onset   Heart failure Mother    Esophageal cancer Father        died age 59, exp to asbestos   Cancer Father    Diabetes Sister    Allergies Sister    Heart attack Maternal Uncle 1   Coronary artery disease Maternal Uncle 73       with CABG    No Known Allergies  Current Outpatient Medications on File Prior to Visit  Medication Sig Dispense  Refill   albuterol  (VENTOLIN  HFA) 108 (90 Base) MCG/ACT inhaler USE 2 INHALATIONS BY MOUTH EVERY 6 HOURS AS NEEDED FOR WHEEZING  OR SHORTNESS OF BREATH 34 g 2   ezetimibe  (ZETIA ) 10 MG tablet Take 1 tablet (10 mg total) by mouth daily. (Patient taking differently: Take 10 mg by mouth at bedtime.) 90 tablet 3   fluticasone  (FLONASE ) 50 MCG/ACT nasal spray Place 2 sprays into both nostrils daily. (Patient taking differently: Place 2 sprays into both nostrils daily as needed for allergies or rhinitis.) 16 g 2   Fluticasone -Umeclidin-Vilant (TRELEGY ELLIPTA ) 100-62.5-25 MCG/ACT AEPB  Inhale 1 puff into the lungs daily. 3 each 3   losartan  (COZAAR ) 25 MG tablet Take 1 tablet (25 mg total) by mouth daily. (Patient taking differently: Take 25 mg by mouth in the morning.) 90 tablet 3   senna (SENOKOT) 8.6 MG tablet Take 1 tablet by mouth See admin instructions. Take 1 tablet by mouth every other night     ipratropium (ATROVENT) 0.02 % nebulizer solution Take 0.5 mg by nebulization every 4 (four) hours as needed for wheezing or shortness of breath.     levalbuterol (XOPENEX) 0.63 MG/3ML nebulizer solution Take 0.63 mg by nebulization every 4 (four) hours as needed for wheezing or shortness of breath.     pantoprazole  (PROTONIX ) 40 MG tablet Take 1 tablet (40 mg total) by mouth 2 (two) times daily. 30 tablet 1   No current facility-administered medications on file prior to visit.    BP 129/71   Pulse 70   Temp 97.9 F (36.6 C) (Oral)   Resp 16   Ht 5' 10 (1.778 m)   Wt 187 lb 3.2 oz (84.9 kg)   SpO2 94%   BMI 26.86 kg/m          Objective:   Physical Exam  General Mental Status- Alert. General Appearance- Not in acute distress.   Skin General: Color- Normal Color. Moisture- Normal Moisture.  Neck Carotid Arteries- Normal color. Moisture- Normal Moisture. No carotid bruits. No JVD.  Chest and Lung Exam Auscultation: Breath Sounds: scattered expiratory wheeze. Some rough breath sounds.  Even and unlabored  Cardiovascular Auscultation:Rythm- Regular. Murmurs & Other Heart Sounds:Auscultation of the heart reveals- No Murmurs.  Abdomen Inspection:-Inspeection Normal. Palpation/Percussion:Note:No mass. Palpation and Percussion of the abdomen reveal- Non Tender, Non Distended + BS, no rebound or guarding.   Neurologic Cranial Nerve exam:- CN III-XII intact(No nystagmus), symmetric smile. Drift Test:- No drift. Strength:- 5/5 equal and symmetric strength both upper and lower extremities.   Rectum- some external hemorrhoids today. Non appear thrombosed/no obvous bleeding. Stool cards + for blood.      Assessment & Plan:   Patient Instructions  Anemia(hx of) Chronic anemia with recent exacerbation. Hemoglobin improved to 13.8 in past but symptoms suggest ongoing GI bleeding. Stool test positive for blood. Previous bone marrow biopsy negative for multiple myeloma. - Order CBC, iron  panel, and metabolic panel including magnesium  stat. - Perform stool test for occult blood. - Follow up on hemoglobin, hematocrit, and iron  levels. - Consider referral to GI specialist based on test results.  Esophageal Varices Grade 2 varices noted on previous EGD. Potential source of intermittent GI bleeding contributing to anemia. No active bleeding observed. - Consider GI referral for further evaluation if anemia persists.  Asthma with productive cough and rough breath sounds on exam. Asthma with wheezing and productive cough. Symptoms may be exacerbated by postnasal drip and possible silent reflux. Current medications include Trelegy, albuterol , and Atrovent. - Order chest x-ray to evaluate for pneumonia. - Consider prescribing azithromycin  or another antibiotic based on chest x-ray results. - Prescribe Xyzal  for nighttime use to manage postnasal drip.  Possible Pneumonia Productive cough with yellow mucus, nasal congestion, and wheezing suggest possible pneumonia. - Order chest  x-ray. - Consider antibiotic treatment based on x-ray findings.  General Health Maintenance Managing multiple chronic conditions including asthma and anemia. Recent liver function tests show minor elevation. Scheduled for follow-up liver scan in July. - Continue current medications and management for chronic conditions. - Follow up on liver scan results in July.  Follow up date to be determined after lab and imaging review.   Time spent with patient today was  45 minutes which consisted of chart review, discussing diagnosis, work up treatment and documentation.   Donney Caraveo, PA-C

## 2023-08-31 ENCOUNTER — Encounter: Payer: Self-pay | Admitting: Internal Medicine

## 2023-08-31 ENCOUNTER — Telehealth: Payer: Self-pay | Admitting: Medical

## 2023-08-31 ENCOUNTER — Ambulatory Visit: Payer: Self-pay | Admitting: Medical

## 2023-08-31 ENCOUNTER — Ambulatory Visit: Admitting: Internal Medicine

## 2023-08-31 VITALS — BP 124/80 | HR 88 | Ht 70.0 in | Wt 186.4 lb

## 2023-08-31 DIAGNOSIS — J309 Allergic rhinitis, unspecified: Secondary | ICD-10-CM

## 2023-08-31 DIAGNOSIS — R058 Other specified cough: Secondary | ICD-10-CM

## 2023-08-31 DIAGNOSIS — K219 Gastro-esophageal reflux disease without esophagitis: Secondary | ICD-10-CM | POA: Diagnosis not present

## 2023-08-31 DIAGNOSIS — J4540 Moderate persistent asthma, uncomplicated: Secondary | ICD-10-CM

## 2023-08-31 MED ORDER — MONTELUKAST SODIUM 10 MG PO TABS: 10.0000 mg | ORAL_TABLET | Freq: Every day | ORAL | 11 refills | Status: AC

## 2023-08-31 MED ORDER — METHYLPREDNISOLONE 4 MG PO TABS: ORAL_TABLET | ORAL | 0 refills | Status: DC

## 2023-08-31 NOTE — Addendum Note (Signed)
 Addended by: TRUDY CURVIN RAMAN on: 08/31/2023 03:01 PM   Modules accepted: Orders

## 2023-08-31 NOTE — Patient Instructions (Addendum)
 It was a pleasure to see you today!  Please schedule follow up with myself in 6 months.  If my schedule is not open yet, we will contact you with a reminder closer to that time. Please call [phone] if you haven't heard from us  a month before, and always call us  sooner if issues or concerns arise. You can also send us  a message through MyChart, but but aware that this is not to be used for urgent issues and it may take up to 5-7 days to receive a reply. Please be aware that you will likely be able to view your results before I have a chance to respond to them. Please give us  5 business days to respond to any non-urgent results.   You have chronic cough from post nasal drainage and silent reflux.   Continue trelegy 1 puff once daily with albuterol  as needed Continue flonase  twice daily. Start nasal spray as needed.  Add montelukast  (singulair ) once daily to treat asthma and allergy . Take cetirizine, levocetirizine, claritin one of those once daily to help dry up the post nasal drainage. Finish steroids started by your PCP yesterday.  Look into the acid reflux diet.   What is GERD? Gastroesophageal reflux disease (GERD) is gastroesophageal reflux diseasewhich occurs when the lower esophageal sphincter (LES) opens spontaneously, for varying periods of time, or does not close properly and stomach contents rise up into the esophagus. GER is also called acid reflux or acid regurgitation, because digestive juices--called acids--rise up with the food. The esophagus is the tube that carries food from the mouth to the stomach. The LES is a ring of muscle at the bottom of the esophagus that acts like a valve between the esophagus and stomach.  When acid reflux occurs, food or fluid can be tasted in the back of the mouth. When refluxed stomach acid touches the lining of the esophagus it may cause a burning sensation in the chest or throat called heartburn or acid indigestion. Occasional reflux is common.  Persistent reflux that occurs more than twice a week is considered GERD, and it can eventually lead to more serious health problems. People of all ages can have GERD. Studies have shown that GERD may worsen or contribute to asthma, chronic cough, and pulmonary fibrosis.   What are the symptoms of GERD? The main symptom of GERD in adults is frequent heartburn, also called acid indigestion--burning-type pain in the lower part of the mid-chest, behind the breast bone, and in the mid-abdomen.  Not all reflux is acidic in nature, and many patients don't have heart burn at all. Sometimes it feels like a cough (either dry or with mucus), choking sensation, asthma, shortness of breath, waking up at night, frequent throat clearing, or trouble swallowing.    What causes GERD? The reason some people develop GERD is still unclear. However, research shows that in people with GERD, the LES relaxes while the rest of the esophagus is working. Anatomical abnormalities such as a hiatal hernia may also contribute to GERD. A hiatal hernia occurs when the upper part of the stomach and the LES move above the diaphragm, the muscle wall that separates the stomach from the chest. Normally, the diaphragm helps the LES keep acid from rising up into the esophagus. When a hiatal hernia is present, acid reflux can occur more easily. A hiatal hernia can occur in people of any age and is most often a normal finding in otherwise healthy people over age 42. Most of the  time, a hiatal hernia produces no symptoms.   Other factors that may contribute to GERD include - Obesity or recent weight gain - Pregnancy  - Smoking  - Diet - Certain medications  Common foods that can worsen reflux symptoms include: - carbonated beverages - artificial sweeteners - citrus fruits  - chocolate  - drinks with caffeine or alcohol  - fatty and fried foods  - garlic and onions  - mint flavorings  - spicy foods  - tomato-based foods, like  spaghetti sauce, salsa, chili, and pizza   Lifestyle Changes If you smoke, stop.  Avoid foods and beverages that worsen symptoms (see above.) Lose weight if needed.  Eat small, frequent meals.  Wear loose-fitting clothes.  Avoid lying down for 3 hours after a meal.  Raise the head of your bed 6 to 8 inches by securing wood blocks under the bedposts. Just using extra pillows will not help, but using a wedge-shaped pillow may be helpful.  Medications  H2 blockers, such as cimetidine (Tagamet HB), famotidine (Pepcid AC), nizatidine (Axid AR), and ranitidine (Zantac 75), decrease acid production. They are available in prescription strength and over-the-counter strength. These drugs provide short-term relief and are effective for about half of those who have GERD symptoms.  Proton pump inhibitors include omeprazole (Prilosec, Zegerid), lansoprazole (Prevacid), pantoprazole  (Protonix ), rabeprazole (Aciphex), and esomeprazole (Nexium), which are available by prescription. Prilosec is also available in over-the-counter strength. Proton pump inhibitors are more effective than H2 blockers and can relieve symptoms and heal the esophageal lining in almost everyone who has GERD.  Because drugs work in different ways, combinations of medications may help control symptoms. People who get heartburn after eating may take both antacids and H2 blockers. The antacids work first to neutralize the acid in the stomach, and then the H2 blockers act on acid production. By the time the antacid stops working, the H2 blocker will have stopped acid production. Your health care provider is the best source of information about how to use medications for GERD.   Points to Remember 1. You can have GERD without having heartburn. Your symptoms could include a dry cough, asthma symptoms, or trouble swallowing.  2. Taking medications daily as prescribed is important in controlling you symptoms.  Sometimes it can take up to 8  weeks to fully achieve the effects of the medications prescribed.  3. Coughing related to GERD can be difficult to treat and is very frustrating!  However, it is important to stick with these medications and lifestyle modifications before pursuing more aggressive or invasive test and treatments.

## 2023-08-31 NOTE — Addendum Note (Signed)
 Addended by: DORINA DALLAS HERO on: 08/31/2023 06:56 AM   Modules accepted: Orders

## 2023-08-31 NOTE — Progress Notes (Signed)
 Ruben Henry    [id number]    [birth year]  Primary Care Physician:[place], [place] RIGGERS Date of Appointment: 08/31/2023 Established Patient Visit  Chief complaint:   Chief Complaint  Patient presents with   Consult     HPI: Ruben Henry is a 78 y.o. man, never smoker, with history of CAD s/p PCI in 1998 and prostate cancer. 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 Diagnosed with asthma in adulthood around 2020.   Interval Updates: Here for follow up for asthma.  Notes worsening breathing and mucus production over the last 6 months.   Cough and mucus production is worst in the morning. Has a hard time falling asleep.  He has frequent throat clearing and wheezing.  He does have some post nasal drainage.   Denies reflux/heartburn. After lunch he has a mucus issue a couple hours after eating lunch.   He uses flonase  twice daily. He did not see any improvement with astelin  nasal spray.  Doesn't take a daily anti histamine over the counter  No interval exacerbations. He feels his breathing is doing well, it's the mucus production and coughing that bothers him.   Uses nebulizer machine minimally. Albuterol  MDI use less than once/week.   Flutter valve not helpful.   Current Regimen: trelegy 1 puff once a day.  Asthma Triggers: seasonal allergies.  Exacerbations in the last year: History of hospitalization or intubation: Allergy  Testing: never had.  GERD: prescribed protonix  but doesn't take it.  Allergic Rhinitis: yes ACT:  Asthma Control Test ACT Total Score  08/31/2023  2:53 PM 10  10/14/2022  3:38 PM 24   FeNO:    I have reviewed the patient's family social and past medical history and updated as appropriate.   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   CAD (coronary artery disease) 07/20/2013   s/p remote PCI of RCA and  LCX in 1998   Hypercholesterolemia    Hypertension    Low hemoglobin 06/02/2023   Osteoarthritis of right hip    Prostate cancer [facility])    brachytherapy    Past Surgical History:  Procedure Laterality Date   CORONARY ANGIOPLASTY WITH STENT PLACEMENT  06/21/1996   prior angioplasty of the RCA with stenting of the LCX in 1998   ESOPHAGOGASTRODUODENOSCOPY N/A 06/03/2023   Procedure: EGD (ESOPHAGOGASTRODUODENOSCOPY);  Surgeon: Dianna Specking, MD;  Location: [facility];  Service: Gastroenterology;  Laterality: N/A;   EYE SURGERY  1969   Right eye /    TONSILLECTOMY AND ADENOIDECTOMY      Family History  Problem Relation Age of Onset   Heart failure Mother    Esophageal cancer Father        died age 61, exp to asbestos   Cancer Father    Diabetes Sister    Allergies Sister    Heart attack Maternal Uncle 39   Coronary artery disease Maternal Uncle 61       with CABG    Social History   Occupational History   Occupation: Garment/textile technologist: [employer]  Tobacco Use   Smoking status: Never   Smokeless tobacco: Never  Vaping Use   Vaping status: Never Used  Substance and Sexual Activity   Alcohol use: Yes    Alcohol/week: 14.0 standard drinks of alcohol    Types: 14 Cans of  beer per week    Comment: couple of beers a day. Coors Light.   Drug use: No   Sexual activity: Yes   Physical Exam: Blood pressure 124/80, pulse 88, height 5' 10 (1.778 m), weight 186 lb 6.4 oz (84.6 kg), SpO2 93%.  Gen:      No acute distress ENT: frequent cough and throat clearing, wheezing auscultated over neck. Lungs:  transmitted wheezing, no crackles CV:       RRR no mrg   Data Reviewed: Imaging: I have personally reviewed the Chest xray May 2024  PFTs:     Latest Ref Rng & Units 05/27/2021    1:55 PM  PFT Results  FVC-Pre L 3.22   FVC-Predicted Pre % 75   FVC-Post L 3.52   FVC-Predicted Post % 82   Pre FEV1/FVC % % 64   Post FEV1/FCV % % 64   FEV1-Pre L 2.06    FEV1-Predicted Pre % 66   FEV1-Post L 2.25   DLCO uncorrected ml/min/mmHg 28.64   DLCO UNC% % 113   DLCO corrected ml/min/mmHg 28.64   DLCO COR %Predicted % 113   DLVA Predicted % 127   TLC L 7.36   TLC % Predicted % 104   RV % Predicted % 145    I have personally reviewed the patient's PFTs and moderate airflow limitation FEV1 66% of predicted.   Labs: Lab Results  Component Value Date   WBC 5.9 08/30/2023   HGB 14.7 08/30/2023   HCT 43.7 08/30/2023   MCV 95.0 08/30/2023   PLT 191.0 08/30/2023   Lab Results  Component Value Date   NA 138 07/15/2023   K 4.1 07/15/2023   CL 104 07/15/2023   CO2 27 07/15/2023   Absolute eosinophil count 700 in August 2024  Immunization status: Immunization History  Administered Date(s) Administered   Influenza, High Dose Seasonal PF 11/01/2015, 01/30/2017, 01/06/2018, 11/15/2018   Influenza-Unspecified 01/30/2017, 11/15/2018, 01/01/2020, 01/16/2021, 01/02/2022   Moderna Sars-Covid-2 Vaccination 04/10/2019, 03/18/2020   Pneumococcal Conjugate-13 06/08/2013   Pneumococcal Polysaccharide-23 01/27/2012   Td 02/16/2006   Tdap 05/04/2016   Zoster Recombinant(Shingrix) 02/13/2019, 04/14/2019   Zoster, Live 12/09/2009    External Records Personally Reviewed: pulmonary  Assessment:  Chronic cough secondary to: Chronic allergic rhinitis GERD Moderate persistent eosinophilic asthma with exacerbaton  Plan/Recommendations:  Continue trelegy 1 puff once daily with albuterol  as needed Continue flonase  twice daily. Start nasal spray as needed.  Add montelukast  (singulair ) once daily to treat asthma and allergy . Take cetirizine, levocetirizine, claritin one of those once daily to help dry up the post nasal drainage. Finish steroids started by your PCP yesterday.     Return to Care: Return in about 3 months (around 12/01/2023).   Verdon Gore, MD Pulmonary and Critical Care Medicine [facility] [phone]

## 2023-08-31 NOTE — Telephone Encounter (Signed)
 Left a message with the pt to come in for a redraw for chemistry test.

## 2023-09-01 ENCOUNTER — Other Ambulatory Visit (INDEPENDENT_AMBULATORY_CARE_PROVIDER_SITE_OTHER)

## 2023-09-01 DIAGNOSIS — R252 Cramp and spasm: Secondary | ICD-10-CM | POA: Diagnosis not present

## 2023-09-01 DIAGNOSIS — D649 Anemia, unspecified: Secondary | ICD-10-CM | POA: Diagnosis not present

## 2023-09-01 DIAGNOSIS — R5383 Other fatigue: Secondary | ICD-10-CM | POA: Diagnosis not present

## 2023-09-01 LAB — COMPREHENSIVE METABOLIC PANEL WITH GFR
ALT: 35 U/L (ref 0–53)
AST: 39 U/L — ABNORMAL HIGH (ref 0–37)
Albumin: 4.1 g/dL (ref 3.5–5.2)
Alkaline Phosphatase: 152 U/L — ABNORMAL HIGH (ref 39–117)
BUN: 18 mg/dL (ref 6–23)
CO2: 26 meq/L (ref 19–32)
Calcium: 9.2 mg/dL (ref 8.4–10.5)
Chloride: 100 meq/L (ref 96–112)
Creatinine, Ser: 0.77 mg/dL (ref 0.40–1.50)
GFR: 86.68 mL/min (ref >60.00)
Glucose, Bld: 142 mg/dL — ABNORMAL HIGH (ref 70–99)
Potassium: 4 meq/L (ref 3.5–5.1)
Sodium: 135 meq/L (ref 135–145)
Total Bilirubin: 0.7 mg/dL (ref 0.2–1.2)
Total Protein: 7.4 g/dL (ref 6.0–8.3)

## 2023-09-01 LAB — TSH: TSH: 4.3 u[IU]/mL (ref 0.35–5.50)

## 2023-09-01 LAB — MAGNESIUM: Magnesium: 1.9 mg/dL (ref 1.5–2.5)

## 2023-09-01 LAB — VITAMIN B12: Vitamin B-12: 873 pg/mL (ref 211–911)

## 2023-09-01 NOTE — Telephone Encounter (Signed)
Labs collected this morning

## 2023-09-04 LAB — IRON,TIBC AND FERRITIN PANEL
%SAT: 25 % (ref 20–48)
Ferritin: 158 ng/mL (ref 24–380)
Iron: 89 ug/dL (ref 50–180)
TIBC: 362 ug/dL (ref 250–425)

## 2023-09-04 LAB — VITAMIN B1: Vitamin B1 (Thiamine): 6 nmol/L — ABNORMAL LOW (ref 8–30)

## 2023-09-11 ENCOUNTER — Encounter: Payer: Self-pay | Admitting: Medical

## 2023-09-14 ENCOUNTER — Ambulatory Visit
Admission: RE | Admit: 2023-09-14 | Discharge: 2023-09-14 | Disposition: A | Discharge status: Home / Self Care | Source: Ambulatory Visit | Attending: Gastroenterology | Admitting: Gastroenterology

## 2023-09-14 DIAGNOSIS — R160 Hepatomegaly, not elsewhere classified: Secondary | ICD-10-CM | POA: Diagnosis not present

## 2023-09-14 DIAGNOSIS — K7460 Unspecified cirrhosis of liver: Secondary | ICD-10-CM

## 2023-10-12 ENCOUNTER — Inpatient Hospital Stay: Attending: Hematology & Oncology

## 2023-10-12 ENCOUNTER — Inpatient Hospital Stay (HOSPITAL_BASED_OUTPATIENT_CLINIC_OR_DEPARTMENT_OTHER): Admitting: Family

## 2023-10-12 VITALS — BP 121/74 | HR 67 | Temp 98.3°F | Resp 18 | Ht 70.0 in | Wt 188.8 lb

## 2023-10-12 DIAGNOSIS — D500 Iron deficiency anemia secondary to blood loss (chronic): Secondary | ICD-10-CM | POA: Insufficient documentation

## 2023-10-12 DIAGNOSIS — K922 Gastrointestinal hemorrhage, unspecified: Secondary | ICD-10-CM | POA: Diagnosis not present

## 2023-10-12 LAB — CBC WITH DIFFERENTIAL (CANCER CENTER ONLY)
Abs Immature Granulocytes: 0.02 K/uL (ref 0.00–0.07)
Basophils Absolute: 0.1 K/uL (ref 0.0–0.1)
Basophils Relative: 1 %
Eosinophils Absolute: 0.8 K/uL — ABNORMAL HIGH (ref 0.0–0.5)
Eosinophils Relative: 12 %
HCT: 41.6 % (ref 39.0–52.0)
Hemoglobin: 14.2 g/dL (ref 13.0–17.0)
Immature Granulocytes: 0 %
Lymphocytes Relative: 18 %
Lymphs Abs: 1.2 K/uL (ref 0.7–4.0)
MCH: 33.6 pg (ref 26.0–34.0)
MCHC: 34.1 g/dL (ref 30.0–36.0)
MCV: 98.6 fL (ref 80.0–100.0)
Monocytes Absolute: 1.3 K/uL — ABNORMAL HIGH (ref 0.1–1.0)
Monocytes Relative: 20 %
Neutro Abs: 3.3 K/uL (ref 1.7–7.7)
Neutrophils Relative %: 49 %
Platelet Count: 182 K/uL (ref 150–400)
RBC: 4.22 MIL/uL (ref 4.22–5.81)
RDW: 16.9 % — ABNORMAL HIGH (ref 11.5–15.5)
WBC Count: 6.6 K/uL (ref 4.0–10.5)
nRBC: 0 % (ref 0.0–0.2)

## 2023-10-12 LAB — CMP (CANCER CENTER ONLY)
ALT: 35 U/L (ref 0–44)
AST: 48 U/L — ABNORMAL HIGH (ref 15–41)
Albumin: 4.4 g/dL (ref 3.5–5.0)
Alkaline Phosphatase: 133 U/L — ABNORMAL HIGH (ref 38–126)
Anion gap: 12 (ref 5–15)
BUN: 7 mg/dL — ABNORMAL LOW (ref 8–23)
CO2: 23 mmol/L (ref 22–32)
Calcium: 9.5 mg/dL (ref 8.9–10.3)
Chloride: 104 mmol/L (ref 98–111)
Creatinine: 0.86 mg/dL (ref 0.61–1.24)
GFR, Estimated: >60 mL/min (ref >60)
Glucose, Bld: 73 mg/dL (ref 70–99)
Potassium: 4.2 mmol/L (ref 3.5–5.1)
Sodium: 139 mmol/L (ref 135–145)
Total Bilirubin: 1 mg/dL (ref 0.0–1.2)
Total Protein: 7.8 g/dL (ref 6.5–8.1)

## 2023-10-12 LAB — IRON AND IRON BINDING CAPACITY (CC-WL,HP ONLY)
Iron: 136 ug/dL (ref 45–182)
Saturation Ratios: 27 % (ref 17.9–39.5)
TIBC: 496 ug/dL — ABNORMAL HIGH (ref 250–450)
UIBC: 360 ug/dL

## 2023-10-12 LAB — RETICULOCYTES
Immature Retic Fract: 12.3 % (ref 2.3–15.9)
RBC.: 4.29 MIL/uL (ref 4.22–5.81)
Retic Count, Absolute: 65.2 K/uL (ref 19.0–186.0)
Retic Ct Pct: 1.5 % (ref 0.4–3.1)

## 2023-10-12 LAB — FERRITIN: Ferritin: 83 ng/mL (ref 24–336)

## 2023-10-12 NOTE — Progress Notes (Signed)
 Hematology and Oncology Follow Up Visit  Ruben Henry [id number] 02/06/1946 78 y.o. 10/12/2023   Principle Diagnosis:  Iron  deficiency anemia-GI bleed   Current Therapy:        IV iron  as indicated    Interim History:  Ruben Henry is here today for follow-up. He is doing well and has no new complaints at this time.  He states that he has been seeing pulmonology for exacerbation of asthma and persistent cough with clear phlegm which has improved.  No fever, chills, n/v, rash, dizziness, SOB, chest pain, palpitations, abdominal pain or changes in bowel or bladder habits.  No swelling, tenderness, numbness or tingling in his extremities at this time.  No falls or syncope.  Appetite and hydration are good. Weight is stable at 188 lbs.  He is still enjoying golfing regularly.  No blood loss, abnormal bruising or petechiae noted.   ECOG Performance Status: 1 - Symptomatic but completely ambulatory  Medications:  Allergies as of 10/12/2023   No Known Allergies      Medication List        Accurate as of October 12, 2023 10:30 AM. If you have any questions, ask your nurse or doctor.          albuterol  108 (90 Base) MCG/ACT inhaler Commonly known as: VENTOLIN  HFA USE 2 INHALATIONS BY MOUTH EVERY 6 HOURS AS NEEDED FOR WHEEZING  OR SHORTNESS OF BREATH   benzonatate  100 MG capsule Commonly known as: TESSALON  Take 1 capsule (100 mg total) by mouth 3 (three) times daily as needed for cough.   ezetimibe  10 MG tablet Commonly known as: ZETIA  Take 1 tablet (10 mg total) by mouth daily. What changed: when to take this   fluticasone  50 MCG/ACT nasal spray Commonly known as: FLONASE  Place 2 sprays into both nostrils daily. What changed:  when to take this reasons to take this   ipratropium 0.02 % nebulizer solution Commonly known as: ATROVENT Take 0.5 mg by nebulization every 4 (four) hours as needed for wheezing or shortness of breath.   levalbuterol 0.63 MG/3ML  nebulizer solution Commonly known as: XOPENEX Take 0.63 mg by nebulization every 4 (four) hours as needed for wheezing or shortness of breath.   losartan  25 MG tablet Commonly known as: COZAAR  Take 1 tablet (25 mg total) by mouth daily. What changed: when to take this   methylPREDNISolone  4 MG tablet Commonly known as: Medrol  Standard 6 day taper dose   montelukast  10 MG tablet Commonly known as: SINGULAIR  Take 1 tablet (10 mg total) by mouth at bedtime.   pantoprazole  40 MG tablet Commonly known as: Protonix  Take 1 tablet (40 mg total) by mouth 2 (two) times daily.   senna 8.6 MG tablet Commonly known as: SENOKOT Take 1 tablet by mouth See admin instructions. Take 1 tablet by mouth every other night   Trelegy Ellipta  100-62.5-25 MCG/ACT Aepb Generic drug: Fluticasone -Umeclidin-Vilant Inhale 1 puff into the lungs daily.        Allergies: No Known Allergies  Past Medical History, Surgical history, Social history, and Family History were reviewed and updated.  Review of Systems: All other 10 point review of systems is negative.   Physical Exam:  vitals were not taken for this visit.   Wt Readings from Last 3 Encounters:  08/31/23 186 lb 6.4 oz (84.6 kg)  08/30/23 187 lb 3.2 oz (84.9 kg)  07/15/23 192 lb (87.1 kg)    Ocular: Sclerae unicteric, pupils equal, round and reactive to light  Ear-nose-throat: Oropharynx clear, dentition fair Lymphatic: No cervical or supraclavicular adenopathy Lungs no rales or rhonchi, good excursion bilaterally Heart regular rate and rhythm, no murmur appreciated Abd soft, nontender, positive bowel sounds MSK no focal spinal tenderness, no joint edema Neuro: non-focal, well-oriented, appropriate affect Breasts: Deferred   Lab Results  Component Value Date   WBC 6.6 10/12/2023   HGB 14.2 10/12/2023   HCT 41.6 10/12/2023   MCV 98.6 10/12/2023   PLT 182 10/12/2023   Lab Results  Component Value Date   FERRITIN 158 08/30/2023    IRON  89 08/30/2023   TIBC 362 08/30/2023   UIBC 497 (H) 07/15/2023   IRONPCTSAT 25 08/30/2023   Lab Results  Component Value Date   RETICCTPCT 1.5 10/12/2023   RBC 4.22 10/12/2023   RBC 4.29 10/12/2023   Lab Results  Component Value Date   KPAFRELGTCHN 25.6 (H) 06/15/2023   LAMBDASER 23.2 06/15/2023   KAPLAMBRATIO 5.02 06/17/2023   Lab Results  Component Value Date   IGGSERUM 1,227 06/15/2023   IGA 351 06/15/2023   IGMSERUM 67 06/15/2023   Lab Results  Component Value Date   TOTALPROTELP 6.2 06/15/2023   ALBUMINELP 3.0 06/15/2023   A1GS 0.3 06/15/2023   A2GS 0.6 06/15/2023   BETS 1.0 06/15/2023   GAMS 1.3 06/15/2023   MSPIKE Not Observed 06/15/2023   SPEI Comment 06/15/2023     Chemistry      Component Value Date/Time   NA 135 09/01/2023 0746   NA 134 01/31/2021 1108   K 4.0 09/01/2023 0746   CL 100 09/01/2023 0746   CO2 26 09/01/2023 0746   BUN 18 09/01/2023 0746   BUN 12 01/31/2021 1108   CREATININE 0.77 09/01/2023 0746   CREATININE 0.78 07/15/2023 1034      Component Value Date/Time   CALCIUM 9.2 09/01/2023 0746   ALKPHOS 152 (H) 09/01/2023 0746   AST 39 (H) 09/01/2023 0746   AST 43 (H) 07/15/2023 1034   ALT 35 09/01/2023 0746   ALT 27 07/15/2023 1034   BILITOT 0.7 09/01/2023 0746   BILITOT 0.9 07/15/2023 1034       Impression and Plan: Ruben Henry is a very pleasant 78 yo caucasian gentleman with iron  deficiency anemia.  Iron  studies are pending. We will replace if needed.  Follow-up in 3 months.   Lauraine Pepper, NP 8/26/202510:30 AM

## 2023-10-22 ENCOUNTER — Ambulatory Visit: Admitting: Nurse Practitioner

## 2023-11-11 DIAGNOSIS — C61 Malignant neoplasm of prostate: Secondary | ICD-10-CM | POA: Diagnosis not present

## 2023-11-11 DIAGNOSIS — R339 Retention of urine, unspecified: Secondary | ICD-10-CM | POA: Diagnosis not present

## 2023-11-29 ENCOUNTER — Ambulatory Visit: Admitting: Medical

## 2023-11-29 ENCOUNTER — Telehealth: Payer: Self-pay

## 2023-11-29 ENCOUNTER — Ambulatory Visit: Payer: Self-pay | Admitting: Medical

## 2023-11-29 ENCOUNTER — Ambulatory Visit (HOSPITAL_BASED_OUTPATIENT_CLINIC_OR_DEPARTMENT_OTHER)
Admission: RE | Admit: 2023-11-29 | Discharge: 2023-11-29 | Disposition: A | Discharge status: Home / Self Care | Source: Ambulatory Visit | Attending: Medical | Admitting: Medical

## 2023-11-29 VITALS — BP 122/72 | HR 61 | Temp 97.6°F | Ht 70.0 in | Wt 189.2 lb

## 2023-11-29 DIAGNOSIS — R5383 Other fatigue: Secondary | ICD-10-CM | POA: Diagnosis not present

## 2023-11-29 DIAGNOSIS — Z96611 Presence of right artificial shoulder joint: Secondary | ICD-10-CM | POA: Diagnosis not present

## 2023-11-29 DIAGNOSIS — K219 Gastro-esophageal reflux disease without esophagitis: Secondary | ICD-10-CM | POA: Diagnosis not present

## 2023-11-29 DIAGNOSIS — R062 Wheezing: Secondary | ICD-10-CM

## 2023-11-29 DIAGNOSIS — J4520 Mild intermittent asthma, uncomplicated: Secondary | ICD-10-CM

## 2023-11-29 DIAGNOSIS — J3089 Other allergic rhinitis: Secondary | ICD-10-CM

## 2023-11-29 DIAGNOSIS — R739 Hyperglycemia, unspecified: Secondary | ICD-10-CM

## 2023-11-29 DIAGNOSIS — D649 Anemia, unspecified: Secondary | ICD-10-CM | POA: Diagnosis not present

## 2023-11-29 DIAGNOSIS — R059 Cough, unspecified: Secondary | ICD-10-CM | POA: Diagnosis not present

## 2023-11-29 LAB — CBC WITH DIFFERENTIAL/PLATELET
Basophils Absolute: 0.1 K/uL (ref 0.0–0.1)
Basophils Relative: 1 % (ref 0.0–3.0)
Eosinophils Absolute: 0.6 K/uL (ref 0.0–0.7)
Eosinophils Relative: 9 % — ABNORMAL HIGH (ref 0.0–5.0)
HCT: 43.7 % (ref 39.0–52.0)
Hemoglobin: 14.5 g/dL (ref 13.0–17.0)
Lymphocytes Relative: 17.4 % (ref 12.0–46.0)
Lymphs Abs: 1.1 K/uL (ref 0.7–4.0)
MCHC: 33.2 g/dL (ref 30.0–36.0)
MCV: 102.4 fl — ABNORMAL HIGH (ref 78.0–100.0)
Monocytes Absolute: 1 K/uL (ref 0.1–1.0)
Monocytes Relative: 16 % — ABNORMAL HIGH (ref 3.0–12.0)
Neutro Abs: 3.5 K/uL (ref 1.4–7.7)
Neutrophils Relative %: 56.6 % (ref 43.0–77.0)
Platelets: 171 K/uL (ref 150.0–400.0)
RBC: 4.27 Mil/uL (ref 4.22–5.81)
RDW: 14.3 % (ref 11.5–15.5)
WBC: 6.1 K/uL (ref 4.0–10.5)

## 2023-11-29 LAB — COMPREHENSIVE METABOLIC PANEL WITH GFR
ALT: 30 U/L (ref 0–53)
AST: 37 U/L (ref 0–37)
Albumin: 4.1 g/dL (ref 3.5–5.2)
Alkaline Phosphatase: 105 U/L (ref 39–117)
BUN: 10 mg/dL (ref 6–23)
CO2: 27 meq/L (ref 19–32)
Calcium: 9.2 mg/dL (ref 8.4–10.5)
Chloride: 102 meq/L (ref 96–112)
Creatinine, Ser: 0.81 mg/dL (ref 0.40–1.50)
GFR: 85.22 mL/min (ref >60.00)
Glucose, Bld: 94 mg/dL (ref 70–99)
Potassium: 4.2 meq/L (ref 3.5–5.1)
Sodium: 137 meq/L (ref 135–145)
Total Bilirubin: 1.1 mg/dL (ref 0.2–1.2)
Total Protein: 7.4 g/dL (ref 6.0–8.3)

## 2023-11-29 LAB — HEMOGLOBIN A1C: Hgb A1c MFr Bld: 5.8 % (ref 4.6–6.5)

## 2023-11-29 MED ORDER — PREDNISONE 10 MG (21) PO TBPK: ORAL_TABLET | ORAL | 0 refills | Status: DC

## 2023-11-29 NOTE — Progress Notes (Signed)
 Subjective:    Patient ID: Ruben Henry, male    DOB: 1945/09/14, 78 y.o.   MRN: [id number]  HPI    Last AVS was below  Anemia(hx of) Chronic anemia with recent exacerbation. Hemoglobin improved to 13.8 in past but symptoms suggest ongoing GI bleeding. Stool test positive for blood. Previous bone marrow biopsy negative for multiple myeloma. - Order CBC, iron  panel, and metabolic panel including magnesium  stat. - Perform stool test for occult blood. - Follow up on hemoglobin, hematocrit, and iron  levels. - Consider referral to GI specialist based on test results.   Esophageal Varices Grade 2 varices noted on previous EGD. Potential source of intermittent GI bleeding contributing to anemia. No active bleeding observed. - Consider GI referral for further evaluation if anemia persists.   Asthma with productive cough and rough breath sounds on exam. Asthma with wheezing and productive cough. Symptoms may be exacerbated by postnasal drip and possible silent reflux. Current medications include Trelegy, albuterol , and Atrovent. - Order chest x-ray to evaluate for pneumonia. - Consider prescribing azithromycin  or another antibiotic based on chest x-ray results. - Prescribe Xyzal  for nighttime use to manage postnasal drip.   Possible Pneumonia Productive cough with yellow mucus, nasal congestion, and wheezing suggest possible pneumonia. - Order chest x-ray. - Consider antibiotic treatment based on x-ray findings.   Ruben Henry is a 78 year old male with asthma who presents with persistent cough and mucus production.  He has been experiencing a persistent cough and mucus production intermittently for over a year, with worsening symptoms over the past month. The mucus production is particularly pronounced about an hour after eating.  He experiences fatigue and shortness of breath, especially with exertion, such as playing golf. He can regain his breath after resting. He  uses albuterol  daily, approximately two puffs at a time, but is unsure of its effectiveness. Previous treatment with prednisone  significantly improved his symptoms, including mucus production and stamina, but the effects were not long-lasting.  He has a history of anemia and esophageal varices, with previous hospitalizations for low hemoglobin levels. Recent hemoglobin tests have been normal, but fatigue persists. No current black or bloody stools.  He takes omeprazole for acid reflux. He does not experience heartburn but continues the medication. He also takes loratadine 10 mg daily and montelukast  at bedtime for allergies.  He has a history of gastritis without bleeding, as seen in a previous endoscopy. He is scheduled to follow up with his gastroenterologist in November and will have blood tests prior to the appointment.    Review of Systems  Constitutional:  Negative for chills, fatigue and unexpected weight change.  HENT:  Negative for congestion and ear pain.   Respiratory:  Positive for cough and wheezing. Negative for chest tightness.        Dyspnea at time on exertion but recovers quickly when rest. Played golf past Friday.  Cardiovascular:  Negative for chest pain and palpitations.  Gastrointestinal:  Negative for abdominal distention, anal bleeding, blood in stool, diarrhea and nausea.  Genitourinary:  Positive for frequency. Negative for dysuria.  Musculoskeletal:  Negative for back pain, joint swelling and neck pain.  Skin:  Negative for rash.  Neurological:  Negative for dizziness, syncope, weakness and headaches.  Hematological:  Negative for adenopathy.  Psychiatric/Behavioral:  Negative for behavioral problems and sleep disturbance.     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   CAD (coronary artery disease) 07/20/2013   s/p remote PCI of RCA and LCX in 1998    Hypercholesterolemia    Hypertension    Low hemoglobin 06/02/2023   Osteoarthritis of right hip    Prostate cancer [facility])    brachytherapy     Social History   Socioeconomic History   Marital status: Married    Spouse name: Not on file   Number of children: 2   Years of education: Not on file   Highest education level: Bachelor's degree (e.g., BA, AB, BS)  Occupational History   Occupation: Garment/textile technologist: [employer]  Tobacco Use   Smoking status: Never   Smokeless tobacco: Never  Vaping Use   Vaping status: Never Used  Substance and Sexual Activity   Alcohol use: Yes    Alcohol/week: 14.0 standard drinks of alcohol    Types: 14 Cans of beer per week    Comment: couple of beers a day. [employer].   Drug use: No   Sexual activity: Yes  Other Topics Concern   Not on file  Social History Narrative   Not on file   Social Drivers of Health   Financial Resource Strain: Low Risk  (11/27/2023)   Overall Financial Resource Strain (CARDIA)    Difficulty of Paying Living Expenses: Not hard at all  Food Insecurity: Unknown (11/27/2023)   Hunger Vital Sign    Worried About Running Out of Food in the Last Year: Never true    Ran Out of Food in the Last Year: Not on file  Transportation Needs: No Transportation Needs (11/27/2023)   PRAPARE - Administrator, Civil Service (Medical): No    Lack of Transportation (Non-Medical): No  Physical Activity: Insufficiently Active (11/27/2023)   Exercise Vital Sign    Days of Exercise per Week: 4 days    Minutes of Exercise per Session: 30 min  Stress: No Stress Concern Present (11/27/2023)   [employer] of Occupational Health - Occupational Stress Questionnaire    Feeling of Stress: Not at all  Social Connections: Unknown (11/27/2023)   Social Connection and Isolation Panel    Frequency of Communication with Friends and Family: Once a week    Frequency of Social Gatherings with Friends and Family:  Three times a week    Attends Religious Services: Patient declined    Active Member of Clubs or Organizations: Not on file    Attends Banker Meetings: Not on file    Marital Status: Married  Intimate Partner Violence: Not At Risk (06/08/2023)   Humiliation, Afraid, Rape, and Kick questionnaire    Fear of Current or Ex-Partner: No    Emotionally Abused: No    Physically Abused: No    Sexually Abused: No    Past Surgical History:  Procedure Laterality Date   CORONARY ANGIOPLASTY WITH STENT PLACEMENT  06/21/1996   prior angioplasty of the RCA with stenting of the LCX in 1998   ESOPHAGOGASTRODUODENOSCOPY N/A 06/03/2023   Procedure: EGD (ESOPHAGOGASTRODUODENOSCOPY);  Surgeon: Dianna Specking, MD;  Location: [facility];  Service: Gastroenterology;  Laterality: N/A;   EYE SURGERY  1969   Right eye /    TONSILLECTOMY AND ADENOIDECTOMY      Family History  Problem Relation Age of Onset   Heart failure Mother    Esophageal cancer Father        died age 39, exp to asbestos   Cancer Father  Diabetes Sister    Allergies Sister    Heart attack Maternal Uncle 72   Coronary artery disease Maternal Uncle 51       with CABG    No Known Allergies  Current Outpatient Medications on File Prior to Visit  Medication Sig Dispense Refill   albuterol  (VENTOLIN  HFA) 108 (90 Base) MCG/ACT inhaler USE 2 INHALATIONS BY MOUTH EVERY 6 HOURS AS NEEDED FOR WHEEZING  OR SHORTNESS OF BREATH 34 g 2   benzonatate  (TESSALON ) 100 MG capsule Take 1 capsule (100 mg total) by mouth 3 (three) times daily as needed for cough. 30 capsule 0   ezetimibe  (ZETIA ) 10 MG tablet Take 1 tablet (10 mg total) by mouth daily. (Patient taking differently: Take 10 mg by mouth at bedtime.) 90 tablet 3   fluticasone  (FLONASE ) 50 MCG/ACT nasal spray Place 2 sprays into both nostrils daily. (Patient taking differently: Place 2 sprays into both nostrils daily as needed for allergies or rhinitis.) 16 g 2    Fluticasone -Umeclidin-Vilant (TRELEGY ELLIPTA ) 100-62.5-25 MCG/ACT AEPB Inhale 1 puff into the lungs daily. 3 each 3   losartan  (COZAAR ) 25 MG tablet Take 1 tablet (25 mg total) by mouth daily. (Patient taking differently: Take 25 mg by mouth in the morning.) 90 tablet 3   montelukast  (SINGULAIR ) 10 MG tablet Take 1 tablet (10 mg total) by mouth at bedtime. 30 tablet 11   senna (SENOKOT) 8.6 MG tablet Take 1 tablet by mouth See admin instructions. Take 1 tablet by mouth every other night     No current facility-administered medications on file prior to visit.    BP (!) 160/70   Pulse 61   Temp 97.6 F (36.4 C) (Oral)   Ht 5' 10 (1.778 m)   Wt 189 lb 3.2 oz (85.8 kg)   SpO2 94%   BMI 27.15 kg/m          Objective:   Physical Exam  General Mental Status- Alert. General Appearance- Not in acute distress.   Skin General: Color- Normal Color. Moisture- Normal Moisture.  Neck Carotid Arteries- Normal color. Moisture- Normal Moisture. No carotid bruits. No JVD.  Chest and Lung Exam Auscultation: Breath Sounds:-Normal.  Cardiovascular Auscultation:Rythm- Regular. Murmurs & Other Heart Sounds:Auscultation of the heart reveals- No Murmurs.  Abdomen Inspection:-Inspeection Normal. Palpation/Percussion:Note:No mass. Palpation and Percussion of the abdomen reveal- Non Tender, Non Distended + BS, no rebound or guarding.    Neurologic Cranial Nerve exam:- CN III-XII intact(No nystagmus), symmetric smile. Drift Test:- No drift. Romberg Exam:- Negative.  Heal to Toe Gait exam:-Normal. Finger to Nose:- Normal/Intact Strength:- 5/5 equal and symmetric strength both upper and lower extremities.    Lower ext- no pedal edema.       Assessment & Plan:   Patient Instructions  Asthma with chronic productive cough and recent flare Chronic productive cough with recent flare. Wheezing and rough breath sounds suggest possible infection. Previous chest x-ray normal. Pulmonologist  prescribed Trelegy, montelukast , and loratadine. Prednisone  provided relief. - Prescribe prednisone  6-day taper. - Order chest x-ray. - Order CBC and infection markers. - Continue Trelegy, montelukast , and loratadine. - Evaluate need for antibiotics based on chest x-ray and blood work results.  Allergic rhinitis Nasal congestion and postnasal drainage. Loratadine and montelukast  currently used. - Continue loratadine 10 mg daily. - Continue montelukast .  Gastroesophageal reflux disease with gastritis Gastritis without bleeding. Omeprazole in use. GERD may contribute to asthma flares. - Continue omeprazole. - Monitor for GERD symptoms.  Esophageal varices without bleeding Esophageal  varices without bleeding. Last endoscopy showed no bleeding. Follow-up with gastroenterologist scheduled. - Order CBC and iron  panel. - Follow up with gastroenterologist in November.  Anemia, unspecified type Previous hospitalization for anemia. Recent hemoglobin levels normal. Fatigue may relate to anemia or lung issues. - Order CBC and iron  panel.  Fatigue, under evaluation Fatigue possibly related to asthma, anemia, or other conditions. Previous hemoglobin levels normal. - Order CBC and iron  panel. - Order metabolic panel. - Evaluate fatigue in context of asthma and anemia.  Follow up date to be determined after lab and imaging review.   Chava Dulac, PA-C   I personally spent a total of 44 minutes in the care of the patient today including performing a medically appropriate exam/evaluation, counseling and educating, placing orders, and documenting clinical information in the EHR.

## 2023-11-29 NOTE — Patient Instructions (Addendum)
 Asthma with chronic productive cough and recent flare Chronic productive cough with recent flare. Wheezing and rough breath sounds suggest possible infection. Previous chest x-ray normal. Pulmonologist prescribed Trelegy, montelukast , and loratadine. Prednisone  provided relief. - Prescribe prednisone  6-day taper. - Order chest x-ray. - Order CBC and infection markers. - Continue Trelegy, montelukast , and loratadine. - Evaluate need for antibiotics based on chest x-ray and blood work results.  Allergic rhinitis Nasal congestion and postnasal drainage. Loratadine and montelukast  currently used. - Continue loratadine 10 mg daily. - Continue montelukast .  Gastroesophageal reflux disease with gastritis Gastritis without bleeding. Omeprazole in use. GERD may contribute to asthma flares. - Continue omeprazole. - Monitor for GERD symptoms.  Esophageal varices without bleeding Esophageal varices without bleeding. Last endoscopy showed no bleeding. Follow-up with gastroenterologist scheduled. - Order CBC and iron  panel. - Follow up with gastroenterologist in November.  Anemia, unspecified type Previous hospitalization for anemia. Recent hemoglobin levels normal. Fatigue may relate to anemia or lung issues. - Order CBC and iron  panel.  Fatigue, under evaluation Fatigue possibly related to asthma, anemia, or other conditions. Previous hemoglobin levels normal. - Order CBC and iron  panel. - Order metabolic panel. - Evaluate fatigue in context of asthma and anemia.  Elevated bp -meant to recheck. Contracted lab but he was already gone. Ask med asssitant to call pt and ask him to check his blood pressure at home or at pharmacy. Check make sure bp less than 140/90    Follow up date to be determined after lab and imaging review.

## 2023-11-29 NOTE — Telephone Encounter (Signed)
 Called pt to notify him that we forgot to recheck his bp but pt did not answer per edward he wants pt to stop by a local pharmacy and recheck bp and let us  know  Notified pt via vm

## 2023-11-30 ENCOUNTER — Encounter: Payer: Self-pay | Admitting: Medical

## 2023-11-30 LAB — IRON,TIBC AND FERRITIN PANEL
%SAT: 19 % — ABNORMAL LOW (ref 20–48)
Ferritin: 30 ng/mL (ref 24–380)
Iron: 86 ug/dL (ref 50–180)
TIBC: 446 ug/dL — ABNORMAL HIGH (ref 250–425)

## 2023-12-16 ENCOUNTER — Ambulatory Visit: Admitting: Internal Medicine

## 2023-12-16 ENCOUNTER — Encounter: Payer: Self-pay | Admitting: Internal Medicine

## 2023-12-16 VITALS — BP 150/79 | HR 69 | Temp 97.8°F | Wt 198.0 lb

## 2023-12-16 DIAGNOSIS — J4521 Mild intermittent asthma with (acute) exacerbation: Secondary | ICD-10-CM | POA: Diagnosis not present

## 2023-12-16 MED ORDER — BUDESONIDE-FORMOTEROL FUMARATE 80-4.5 MCG/ACT IN AERO: INHALATION_SPRAY | RESPIRATORY_TRACT | 12 refills | Status: AC

## 2023-12-16 NOTE — Assessment & Plan Note (Addendum)
 Never smoker/ Onset age 78, some better on flovent  110  -   9/02/24/22   IgE 483 pos RW  - 12/05/2021  After extensive coaching inhaler device,  effectiveness =    75% > try breztri  samples then maint on symbicort  80  2-4 puffs per day  - 12/16/2023  After extensive coaching inhaler device,  effectiveness =   80%   > resume symbicort  80 2bid/ continue singulair  and gerd rx   His concern is the freq flares needed prednisone  = cough  so DPI's not the best choice and ideally with AB should be back on symbiocrt 80 2bid and not on triple rx as this is not copd  and hs already tried and failed ICS alone (flovent )   Discussed in detail all the  indications, usual  risks and alternatives  relative to the benefits with patient who agrees to proceed with Rx as outlined.           Each maintenance medication was reviewed in detail including emphasizing most importantly the difference between maintenance and prns and under what circumstances the prns are to be triggered using an action plan format where appropriate.  Total time for H and P, chart review, counseling, reviewing hfa  device(s) and generating customized AVS unique to this office visit / same day charting = 34 min

## 2023-12-16 NOTE — Patient Instructions (Addendum)
 Plan A = Automatic = Always=   Symbicort  (Breztri  sample) Take 2 puffs first thing in am and then another 2 puffs about 12 hours later.    Work on inhaler technique:  relax and gently blow all the way out then take a nice smooth full deep breath back in, triggering the inhaler at same time you start breathing in.  Hold breath in for at least  5 seconds if you can. Blow out symbicort   thru nose. Rinse and gargle with water when done.  If mouth or throat bother you at all,  try brushing teeth/gums/tongue with arm and hammer toothpaste/ make a slurry and gargle and spit out.   Also stay on singulair / Try prilosec otc 20mg   Take 30-60 min before first meal of the day and Pepcid ac (famotidine) 20 mg one @  bedtime until return    Plan B = Backup (to supplement plan A, not to replace it) Use your albuterol  inhaler as a rescue medication to be used if you can't catch your breath by resting or slowing your pace  or doing a relaxed purse lip breathing pattern.  - The less you use it, the better it will work when you need it. - Ok to use the inhaler up to 2 puffs  every 4 hours if you must but call for appointment if use goes up over your usual need - Don't leave home without it !!  (think of it like the spare tire or starter fluid for your car)   Please schedule a follow up visit in 3 months but call sooner if needed  with all medications /inhalers/ solutions in hand so we can verify exactly what you are taking. This includes all medications from all doctors and over the counters

## 2023-12-16 NOTE — Progress Notes (Signed)
 Ruben Henry, male    DOB: 11/28/1945   MRN: [id number]   Brief patient profile:  63  yowm never smoker  with onset intermittent cough and sob x age 78  fine between maint on Flovent  but 3-4 flares per year despite flovent  of refractory cough and wheeze      History of Present Illness  12/05/2021  Pulmonary/ Acute office eval/[name] maint on flovent   Chief Complaint  Patient presents with   Acute Visit    Pt c/o prod cough with yellow sputum over the past month- worse in the morning when he wakes up. He also c/o PND and wheezing.   Dyspnea:  assoc with cough/ congestion/ wheeze  Cough: slt yellow esp in am assoc with sense of pnds  Sleep: fine sleeping though wheezing at hs  SABA use: none need at baseline but using twice  Rec Plan A = Automatic = Always=    Breztri  (Symbicort  80)  Work on inhaler technique:  Plan B = Backup (to supplement plan A, not to replace it) Only use your albuterol  inhaler as a rescue medication  Prednisone  10 mg take  4 each am x 2 days,   2 each am x 2 days,  1 each am x 2 days and stop  If still coughing >  Try prilosec otc 20mg   Take 30-60 min before first meal of the day and Pepcid ac (famotidine) 20 mg one @  bedtime until cough is completely gone for at least a week without the need for cough suppression   12/16/2023  f/u ov/Keilly Fatula re:   maint on Trelegy   singulair  / prilosec / pepcid  Chief Complaint  Patient presents with   Asthma    Excessive mucus production. Improved with help of prednisone , however symptom came back in 3 weeks.  Dyspnea:  Not limited by breathing from desired activities   Cough: 1st thing in am but then clears rapidly with min mucoid sputum Sleeping: able to lie flat/ 2 pillows s  resp cc  SABA use: one week      No obvious day to day or daytime variability or assoc excess/ purulent sputum or mucus plugs or hemoptysis or cp or chest tightness, subjective wheeze or overt sinus or hb symptoms.    Also denies any obvious  fluctuation of symptoms with weather or environmental changes or other aggravating or alleviating factors except as outlined above   No unusual exposure hx or h/o childhood pna/ asthma or knowledge of premature birth.  Current Allergies, Complete Past Medical History, Past Surgical History, Family History, and Social History were reviewed in [employer] record.  ROS  The following are not active complaints unless bolded Hoarseness, sore throat, dysphagia, dental problems, itching, sneezing,  nasal congestion or discharge of excess mucus or purulent secretions, ear ache,   fever, chills, sweats, unintended wt loss or wt gain, classically pleuritic or exertional cp,  orthopnea pnd or arm/hand swelling  or leg swelling, presyncope, palpitations, abdominal pain, anorexia, nausea, vomiting, diarrhea  or change in bowel habits or change in bladder habits, change in stools or change in urine, dysuria, hematuria,  rash, arthralgias, visual complaints, headache, numbness, weakness or ataxia or problems with walking or coordination,  change in mood or  memory.        Current Meds  Medication Sig   albuterol  (VENTOLIN  HFA) 108 (90 Base) MCG/ACT inhaler USE 2 INHALATIONS BY MOUTH EVERY 6 HOURS AS NEEDED FOR WHEEZING  OR  SHORTNESS OF BREATH   benzonatate  (TESSALON ) 100 MG capsule Take 1 capsule (100 mg total) by mouth 3 (three) times daily as needed for cough.   ezetimibe  (ZETIA ) 10 MG tablet Take 1 tablet (10 mg total) by mouth daily.   fluticasone  (FLONASE ) 50 MCG/ACT nasal spray Place 2 sprays into both nostrils daily.   Fluticasone -Umeclidin-Vilant (TRELEGY ELLIPTA ) 100-62.5-25 MCG/ACT AEPB Inhale 1 puff into the lungs daily.   losartan  (COZAAR ) 25 MG tablet Take 1 tablet (25 mg total) by mouth daily.   montelukast  (SINGULAIR ) 10 MG tablet Take 1 tablet (10 mg total) by mouth at bedtime.   senna (SENOKOT) 8.6 MG tablet Take 1 tablet by mouth See admin instructions. Take 1 tablet by  mouth every other night           Objective:    Wt Readings from Last 3 Encounters:  12/16/23 198 lb (89.8 kg)  11/29/23 189 lb 3.2 oz (85.8 kg)  10/12/23 188 lb 12.8 oz (85.6 kg)      Vital signs reviewed  12/16/2023  - Note at rest 02 sats  96% on RA   General appearance:    amb meticulous  wm nad/ list of concerns about all this mucus but denies excess day or noct cough.   HEENT : Oropharynx  clear      Nasal turbinates nl    NECK :  without  apparent JVD/ palpable Nodes/TM    LUNGS: no acc muscle use,  Nl contour chest which is clear to A and P bilaterally without cough on insp or exp maneuvers   CV:  RRR  no s3 or murmur or increase in P2, and no edema   ABD:  soft and nontender   MS:  Gait nl   ext warm without deformities Or obvious joint restrictions  calf tenderness, cyanosis or clubbing    SKIN: warm and dry without lesions    NEURO:  alert, approp, nl sensorium with  no motor or cerebellar deficits apparent.      Assessment   Assessment & Plan Mild intermittent asthmatic bronchitis with acute exacerbation Never smoker/ Onset age 89, some better on flovent  110  -   9/02/24/22   IgE 483 pos RW  - 12/05/2021  After extensive coaching inhaler device,  effectiveness =    75% > try breztri  samples then maint on symbicort  80  2-4 puffs per day  - 12/16/2023  After extensive coaching inhaler device,  effectiveness =   80%   > resume symbicort  80 2bid/ continue singulair  and gerd rx   His concern is the freq flares needed prednisone  = cough  so DPI's not the best choice and ideally with AB should be back on symbiocrt 80 2bid and not on triple rx as this is not copd  and hs already tried and failed ICS alone (flovent )   Discussed in detail all the  indications, usual  risks and alternatives  relative to the benefits with patient who agrees to proceed with Rx as outlined.           Each maintenance medication was reviewed in detail including emphasizing most  importantly the difference between maintenance and prns and under what circumstances the prns are to be triggered using an action plan format where appropriate.  Total time for H and P, chart review, counseling, reviewing hfa  device(s) and generating customized AVS unique to this office visit / same day charting = 34 min  AVS  Patient Instructions  Plan A = Automatic = Always=   Symbicort  (Breztri ) Take 2 puffs first thing in am and then another 2 puffs about 12 hours later.    Work on inhaler technique:  relax and gently blow all the way out then take a nice smooth full deep breath back in, triggering the inhaler at same time you start breathing in.  Hold breath in for at least  5 seconds if you can. Blow out symbicort   thru nose. Rinse and gargle with water when done.  If mouth or throat bother you at all,  try brushing teeth/gums/tongue with arm and hammer toothpaste/ make a slurry and gargle and spit out.   Also stay on singulair / Try prilosec otc 20mg   Take 30-60 min before first meal of the day and Pepcid ac (famotidine) 20 mg one @  bedtime until return    Plan B = Backup (to supplement plan A, not to replace it) Use your albuterol  inhaler as a rescue medication to be used if you can't catch your breath by resting or slowing your pace  or doing a relaxed purse lip breathing pattern.  - The less you use it, the better it will work when you need it. - Ok to use the inhaler up to 2 puffs  every 4 hours if you must but call for appointment if use goes up over your usual need - Don't leave home without it !!  (think of it like the spare tire or starter fluid for your car)   Please schedule a follow up visit in 3 months but call sooner if needed  with all medications /inhalers/ solutions in hand so we can verify exactly what you are taking. This includes all medications from all doctors and over the counters          Ozell America, MD 12/16/2023

## 2023-12-30 DIAGNOSIS — K7460 Unspecified cirrhosis of liver: Secondary | ICD-10-CM | POA: Diagnosis not present

## 2024-01-04 DIAGNOSIS — G5601 Carpal tunnel syndrome, right upper limb: Secondary | ICD-10-CM | POA: Diagnosis not present

## 2024-01-04 DIAGNOSIS — M25531 Pain in right wrist: Secondary | ICD-10-CM | POA: Diagnosis not present

## 2024-01-06 ENCOUNTER — Other Ambulatory Visit: Payer: Self-pay | Admitting: Gastroenterology

## 2024-01-06 DIAGNOSIS — K5900 Constipation, unspecified: Secondary | ICD-10-CM | POA: Diagnosis not present

## 2024-01-06 DIAGNOSIS — K7460 Unspecified cirrhosis of liver: Secondary | ICD-10-CM

## 2024-01-12 ENCOUNTER — Inpatient Hospital Stay

## 2024-01-12 ENCOUNTER — Inpatient Hospital Stay: Admitting: Family

## 2024-01-16 ENCOUNTER — Encounter: Payer: Self-pay | Admitting: Medical

## 2024-01-20 ENCOUNTER — Ambulatory Visit: Payer: Self-pay | Admitting: Medical

## 2024-01-20 ENCOUNTER — Ambulatory Visit (HOSPITAL_BASED_OUTPATIENT_CLINIC_OR_DEPARTMENT_OTHER)
Admission: RE | Admit: 2024-01-20 | Discharge: 2024-01-20 | Disposition: A | Source: Ambulatory Visit | Attending: Medical | Admitting: Medical

## 2024-01-20 ENCOUNTER — Ambulatory Visit: Admitting: Medical

## 2024-01-20 VITALS — BP 118/80 | HR 76 | Temp 97.6°F | Resp 15 | Ht 70.0 in | Wt 188.0 lb

## 2024-01-20 DIAGNOSIS — K219 Gastro-esophageal reflux disease without esophagitis: Secondary | ICD-10-CM

## 2024-01-20 DIAGNOSIS — R059 Cough, unspecified: Secondary | ICD-10-CM | POA: Diagnosis not present

## 2024-01-20 DIAGNOSIS — R0989 Other specified symptoms and signs involving the circulatory and respiratory systems: Secondary | ICD-10-CM | POA: Diagnosis not present

## 2024-01-20 DIAGNOSIS — R058 Other specified cough: Secondary | ICD-10-CM | POA: Diagnosis not present

## 2024-01-20 DIAGNOSIS — J45901 Unspecified asthma with (acute) exacerbation: Secondary | ICD-10-CM | POA: Diagnosis not present

## 2024-01-20 MED ORDER — PREDNISONE 10 MG (21) PO TBPK: ORAL_TABLET | ORAL | 0 refills | Status: AC

## 2024-01-20 MED ORDER — BENZONATATE 100 MG PO CAPS: 100.0000 mg | ORAL_CAPSULE | Freq: Three times a day (TID) | ORAL | 0 refills | Status: AC | PRN

## 2024-01-20 NOTE — Progress Notes (Signed)
 Subjective:    Patient ID: Ruben Henry, male    DOB: 1945/11/24, 78 y.o.   MRN: [id number]  HPI  Pt last AVS below    Patient Instructions  Asthma with chronic productive cough and recent flare Chronic productive cough with recent flare. Wheezing and rough breath sounds suggest possible infection. Previous chest x-ray normal. Pulmonologist prescribed Trelegy, montelukast , and loratadine. Prednisone  provided relief. - Prescribe prednisone  6-day taper. - Order chest x-ray. - Order CBC and infection markers. - Continue Trelegy, montelukast , and loratadine. - Evaluate need for antibiotics based on chest x-ray and blood work results.   Allergic rhinitis Nasal congestion and postnasal drainage. Loratadine and montelukast  currently used. - Continue loratadine 10 mg daily. - Continue montelukast .   Gastroesophageal reflux disease with gastritis Gastritis without bleeding. Omeprazole in use. GERD may contribute to asthma flares. - Continue omeprazole. - Monitor for GERD symptoms.   Esophageal varices without bleeding Esophageal varices without bleeding. Last endoscopy showed no bleeding. Follow-up with gastroenterologist scheduled. - Order CBC and iron  panel. - Follow up with gastroenterologist in November.   Anemia, unspecified type Previous hospitalization for anemia. Recent hemoglobin levels normal. Fatigue may relate to anemia or lung issues. - Order CBC and iron  panel.   Fatigue, under evaluation Fatigue possibly related to asthma, anemia, or other conditions. Previous hemoglobin levels normal. - Order CBC and iron  panel. - Order metabolic panel. - Evaluate fatigue in context of asthma and anemia   Last AVS pulmonologist below Mild intermittent asthmatic bronchitis with acute exacerbation Never smoker/ Onset age 76, some better on flovent  110  -   9/02/24/22   IgE 483 pos RW  - 12/05/2021  After extensive coaching inhaler device,  effectiveness =    75% > try  breztri  samples then maint on symbicort  80  2-4 puffs per day  - 12/16/2023  After extensive coaching inhaler device,  effectiveness =   80%   > resume symbicort  80 2bid/ continue singulair  and gerd rx    His concern is the freq flares needed prednisone  = cough  so DPI's not the best choice and ideally with AB should be back on symbiocrt 80 2bid and not on triple rx as this is not copd  and hs already tried and failed ICS alone (flovent )    Discussed in detail all the  indications, usual  risks and alternatives  relative to the benefits with patient who agrees to proceed with Rx as outlined.             Patient Instructions  Plan A = Automatic = Always=   Symbicort  (Breztri ) Take 2 puffs first thing in am and then another 2 puffs about 12 hours later.    Work on inhaler technique:  relax and gently blow all the way out then take a nice smooth full deep breath back in, triggering the inhaler at same time you start breathing in.  Hold breath in for at least  5 seconds if you can. Blow out symbicort   thru nose. Rinse and gargle with water when done.  If mouth or throat bother you at all,  try brushing teeth/gums/tongue with arm and hammer toothpaste/ make a slurry and gargle and spit out.    Also stay on singulair / Try prilosec otc 20mg   Take 30-60 min before first meal of the day and Pepcid ac (famotidine) 20 mg one @  bedtime until return      Plan B = Backup (to supplement plan A, not to  replace it) Use your albuterol  inhaler as a rescue medication to be used if you can't catch your breath by resting or slowing your pace  or doing a relaxed purse lip breathing pattern.  - The less you use it, the better it will work when you need it. - Ok to use the inhaler up to 2 puffs  every 4 hours if you must but call for appointment if use goes up over your usual need - Don't leave home without it !!  (think of it like the spare tire or starter fluid for your car)    Please schedule a follow up visit in  3 months but call sooner if needed  with all medications /inhalers/ solutions in hand so we can verify exactly what you are taking. This includes all medications from all doctors and over the counters  Each maintenance medication was reviewed in detail including emphasizing most importantly the difference between maintenance and prns and under what circumstances the prns are to be triggered using an action plan format where appropriate.   Total time for H and P, chart review, counseling, reviewing hfa  device(s) and generating customized AVS unique to this office visit / same day charting = 34 min        Ruben Henry is a 78 year old male with asthma who presents with coughing and mucus production.  He has coughing with mucus production that worsens after meals and with physical activity, causing dyspnea. He feels as if mucus is coating his lungs. He denies fevers, chills, or sweats.  On nov 30th he states he started to wheeze and cough. Gradually getting worsee.  He has asthma and was switched from Trelegy to Symbicort  (generic Breyna  80/4.5, two inhalations twice daily) after a pulmonology visit. Prednisone  has relieved similar symptoms within four days in the past. He takes montelukast  10 mg at bedtime for asthma and allergies and uses benzonatate  as needed for cough. He has recently used Mucinex.  He takes omeprazole 20 mg in the morning and famotidine 20 mg at night for gastric acid, which may trigger his asthma, but he feels these do not help the mucus.  He had prior anemia that has resolved. Recent labs showed an A1c of 5.8. He is worried about developing diabetes because his sister is diabetic.   Review of Systems  Constitutional:  Negative for chills, fatigue and fever.  HENT:  Negative for congestion.   Respiratory:  Positive for cough and wheezing. Negative for choking.   Cardiovascular:  Negative for chest pain and palpitations.  Gastrointestinal:  Negative for  abdominal pain.  Genitourinary:  Negative for dysuria.  Musculoskeletal:  Negative for neck pain.  Skin:  Negative for rash.  Neurological:  Negative for dizziness, syncope, weakness and light-headedness.  Psychiatric/Behavioral:  Negative for behavioral problems and sleep disturbance. The patient is not nervous/anxious.     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   CAD (coronary artery disease) 07/20/2013   s/p remote PCI of RCA and LCX in 1998   Hypercholesterolemia    Hypertension    Low hemoglobin 06/02/2023   Osteoarthritis of right hip    Prostate cancer [facility])    brachytherapy     Social History   Socioeconomic History   Marital status: Married    Spouse name: Not on file   Number of children: 2  Years of education: Not on file   Highest education level: Bachelor's degree (e.g., BA, AB, BS)  Occupational History   Occupation: Garment/textile Technologist: [employer]  Tobacco Use   Smoking status: Never   Smokeless tobacco: Never  Vaping Use   Vaping status: Never Used  Substance and Sexual Activity   Alcohol use: Yes    Alcohol/week: 14.0 standard drinks of alcohol    Types: 14 Cans of beer per week    Comment: couple of beers a day. [employer].   Drug use: No   Sexual activity: Yes  Other Topics Concern   Not on file  Social History Narrative   Not on file   Social Drivers of Health   Financial Resource Strain: Low Risk  (11/27/2023)   Overall Financial Resource Strain (CARDIA)    Difficulty of Paying Living Expenses: Not hard at all  Food Insecurity: Unknown (11/27/2023)   Hunger Vital Sign    Worried About Running Out of Food in the Last Year: Never true    Ran Out of Food in the Last Year: Not on file  Transportation Needs: No Transportation Needs (11/27/2023)   PRAPARE - Administrator, Civil Service (Medical): No    Lack of  Transportation (Non-Medical): No  Physical Activity: Insufficiently Active (11/27/2023)   Exercise Vital Sign    Days of Exercise per Week: 4 days    Minutes of Exercise per Session: 30 min  Stress: No Stress Concern Present (11/27/2023)   [employer] of Occupational Health - Occupational Stress Questionnaire    Feeling of Stress: Not at all  Social Connections: Unknown (11/27/2023)   Social Connection and Isolation Panel    Frequency of Communication with Friends and Family: Once a week    Frequency of Social Gatherings with Friends and Family: Three times a week    Attends Religious Services: Patient declined    Active Member of Clubs or Organizations: Not on file    Attends Banker Meetings: Not on file    Marital Status: Married  Intimate Partner Violence: Not At Risk (06/08/2023)   Humiliation, Afraid, Rape, and Kick questionnaire    Fear of Current or Ex-Partner: No    Emotionally Abused: No    Physically Abused: No    Sexually Abused: No    Past Surgical History:  Procedure Laterality Date   CORONARY ANGIOPLASTY WITH STENT PLACEMENT  06/21/1996   prior angioplasty of the RCA with stenting of the LCX in 1998   ESOPHAGOGASTRODUODENOSCOPY N/A 06/03/2023   Procedure: EGD (ESOPHAGOGASTRODUODENOSCOPY);  Surgeon: Dianna Specking, MD;  Location: [facility];  Service: Gastroenterology;  Laterality: N/A;   EYE SURGERY  1969   Right eye /    TONSILLECTOMY AND ADENOIDECTOMY      Family History  Problem Relation Age of Onset   Heart failure Mother    Esophageal cancer Father        died age 23, exp to asbestos   Cancer Father    Diabetes Sister    Allergies Sister    Heart attack Maternal Uncle 53   Coronary artery disease Maternal Uncle 7       with CABG    No Known Allergies  Current Outpatient Medications on File Prior to Visit  Medication Sig Dispense Refill   albuterol  (VENTOLIN  HFA) 108 (90 Base) MCG/ACT inhaler USE 2 INHALATIONS BY MOUTH  EVERY 6 HOURS AS NEEDED FOR WHEEZING  OR SHORTNESS OF BREATH 34  g 2   benzonatate  (TESSALON ) 100 MG capsule Take 1 capsule (100 mg total) by mouth 3 (three) times daily as needed for cough. 30 capsule 0   budesonide -formoterol  (BREYNA ) 80-4.5 MCG/ACT inhaler Inhale 2 puffs into the lungs as needed.     budesonide -formoterol  (SYMBICORT ) 80-4.5 MCG/ACT inhaler Take 2 puffs first thing in am and then another 2 puffs about 12 hours later. 1 each 12   ezetimibe  (ZETIA ) 10 MG tablet Take 1 tablet (10 mg total) by mouth daily. 90 tablet 3   fluticasone  (FLONASE ) 50 MCG/ACT nasal spray Place 2 sprays into both nostrils daily. 16 g 2   losartan  (COZAAR ) 25 MG tablet Take 1 tablet (25 mg total) by mouth daily. 90 tablet 3   montelukast  (SINGULAIR ) 10 MG tablet Take 1 tablet (10 mg total) by mouth at bedtime. 30 tablet 11   senna (SENOKOT) 8.6 MG tablet Take 1 tablet by mouth See admin instructions. Take 1 tablet by mouth every other night     No current facility-administered medications on file prior to visit.    BP 118/80   Pulse 76   Temp 97.6 F (36.4 C) (Oral)   Resp 15   Ht 5' 10 (1.778 m)   Wt 188 lb (85.3 kg)   SpO2 98%   BMI 26.98 kg/m        Objective:   Physical Exam  General Mental Status- Alert. General Appearance- Not in acute distress.   Skin General: Color- Normal Color. Moisture- Normal Moisture.  Neck Carotid Arteries- Normal color. Moisture- Normal Moisture. No carotid bruits. No JVD.  Chest and Lung Exam Auscultation: Breath Sounds:-scattered rough breath sounds with expiratory wheeze. Even and unlabroed.  Cardiovascular Auscultation:Rythm- RRR Murmurs & Other Heart Sounds:Auscultation of the heart reveals- No Murmurs.  Abdomen Inspection:-Inspeection Normal. Palpation/Percussion:Note:No mass. Palpation and Percussion of the abdomen reveal- Non Tender, Non Distended + BS, no rebound or guarding.    Neurologic Cranial Nerve exam:- CN III-XII intact(No  nystagmus), symmetric smile. Strength:- 5/5 equal and symmetric strength both upper and lower extremities.   Lower ext- calfs symmetric, negative homan signs. No pedal edema bilaterally.    Assessment & Plan:   Patient Instructions  Athma with acute exacerbation Asthma exacerbation with wheezing and cough, worsened postprandially. Current regimen includes Breyna , montelukast , and benzonatate . Pulmonologist suggested reflux management. Prednisone  effective but with side effects. Consider increasing Symbicort  if symptoms persist. - Prescribed 6-day prednisone  taper. - Refilled benzonatate . - Ordered chest x-ray to rule out infection. - Increase omeprazole to 40 mg daily and famotidine to 40 mg daily. - Update in 7-10 days regarding symptoms. - Consider increasing Symbicort  to 160/4.5 if symptoms persist.  Gastroesophageal reflux disease GERD potentially contributing to asthma exacerbation. Current treatment includes omeprazole and famotidine. Increasing dosage may help manage symptoms. - Increase omeprazole to 40 mg daily. - Increase famotidine to 40 mg daily.  Hx of anemia but last check hb/hct good and interval check of cbc with urologist reported to be stable.  Follow up date to be determined after my chart update in one week or sooner if needed.    Kemaria Dedic, PA-C   I personally spent a total of 40 minutes in the care of the patient today including performing a medically appropriate exam/evaluation, counseling and educating, placing orders, and documenting clinical information in the EHR.

## 2024-01-20 NOTE — Patient Instructions (Addendum)
 Asthma with acute exacerbation Asthma exacerbation with wheezing and cough, worsened postprandially. Current regimen includes Breyna , montelukast , and benzonatate . Pulmonologist suggested reflux management. Prednisone  effective but with side effects. Consider increasing Symbicort  if symptoms persist. - Prescribed 6-day prednisone  taper. - Refilled benzonatate . - Ordered chest x-ray to rule out infection. - Increase omeprazole to 40 mg daily and famotidine to 40 mg daily. - Update in 7-10 days regarding symptoms. - Consider increasing Symbicort  to 160/4.5 if symptoms persist.  Gastroesophageal reflux disease GERD potentially contributing to asthma exacerbation. Current treatment includes omeprazole and famotidine. Increasing dosage may help manage symptoms. - Increase omeprazole to 40 mg daily. - Increase famotidine to 40 mg daily.  Hx of anemia but last check hb/hct good and interval check of cbc with urologist reported to be stable.  Follow up date to be determined after my chart update in one week or sooner if needed.

## 2024-02-23 ENCOUNTER — Other Ambulatory Visit: Payer: Self-pay | Admitting: Medical

## 2024-02-27 ENCOUNTER — Encounter: Payer: Self-pay | Admitting: Family

## 2024-02-28 ENCOUNTER — Other Ambulatory Visit: Payer: Self-pay | Admitting: Family

## 2024-03-21 ENCOUNTER — Telehealth: Payer: Self-pay

## 2024-03-21 NOTE — Telephone Encounter (Signed)
 Copied from CRM #[id number]. Topic: Clinical - Medication Question >> Mar 21, 2024 11:39 AM Ruben Henry wrote: Reason for CRM: Patient calling to complain of mucus production, congestion, cough, and fatigue. Patient is requesting, as this is a roccuring thing, to have a  prescription of prednisone  written for him prior to appointment schedule for next week. Please advise patient. Patient is requesting a long-term prescription be discussed at next appointment. Please update patient with provider decision.

## 2024-03-22 NOTE — Telephone Encounter (Unsigned)
 Copied from CRM #[id number]. Topic: Clinical - Medication Question >> Mar 21, 2024 11:39 AM Ruben Henry wrote: Reason for CRM: Patient calling to complain of mucus production, congestion, cough, and fatigue. Patient is requesting, as this is a roccuring thing, to have a  prescription of prednisone  written for him prior to appointment schedule for next week. Please advise patient. Patient is requesting a long-term prescription be discussed at next appointment. Please update patient with provider decision. >> Mar 22, 2024  1:59 PM Ruben Henry wrote: Patient is requesting an update as he needs this prednisone  because he's worried he will end up in the hospital without it. Please call patient back to advise.

## 2024-03-23 ENCOUNTER — Ambulatory Visit: Payer: Self-pay | Admitting: Internal Medicine

## 2024-03-23 ENCOUNTER — Encounter (HOSPITAL_BASED_OUTPATIENT_CLINIC_OR_DEPARTMENT_OTHER): Payer: Self-pay

## 2024-03-23 ENCOUNTER — Other Ambulatory Visit: Payer: Self-pay

## 2024-03-23 ENCOUNTER — Inpatient Hospital Stay (HOSPITAL_BASED_OUTPATIENT_CLINIC_OR_DEPARTMENT_OTHER)
Admission: EM | Admit: 2024-03-23 | Payer: Medicare (Managed Care) | Source: Home / Self Care | Attending: Internal Medicine | Admitting: Internal Medicine

## 2024-03-23 ENCOUNTER — Emergency Department (HOSPITAL_BASED_OUTPATIENT_CLINIC_OR_DEPARTMENT_OTHER): Payer: Medicare (Managed Care)

## 2024-03-23 DIAGNOSIS — I2510 Atherosclerotic heart disease of native coronary artery without angina pectoris: Secondary | ICD-10-CM | POA: Diagnosis present

## 2024-03-23 DIAGNOSIS — E7800 Pure hypercholesterolemia, unspecified: Secondary | ICD-10-CM | POA: Diagnosis present

## 2024-03-23 DIAGNOSIS — J4551 Severe persistent asthma with (acute) exacerbation: Principal | ICD-10-CM

## 2024-03-23 DIAGNOSIS — I10 Essential (primary) hypertension: Secondary | ICD-10-CM | POA: Diagnosis present

## 2024-03-23 DIAGNOSIS — J45901 Unspecified asthma with (acute) exacerbation: Secondary | ICD-10-CM | POA: Diagnosis present

## 2024-03-23 LAB — CBC
HCT: 44.6 % (ref 39.0–52.0)
Hemoglobin: 15 g/dL (ref 13.0–17.0)
MCH: 33.4 pg (ref 26.0–34.0)
MCHC: 33.6 g/dL (ref 30.0–36.0)
MCV: 99.3 fL (ref 80.0–100.0)
Platelets: 243 10*3/uL (ref 150–400)
RBC: 4.49 MIL/uL (ref 4.22–5.81)
RDW: 14.4 % (ref 11.5–15.5)
WBC: 5.5 10*3/uL (ref 4.0–10.5)
nRBC: 0 % (ref 0.0–0.2)

## 2024-03-23 LAB — RESP PANEL BY RT-PCR (RSV, FLU A&B, COVID)  RVPGX2
Influenza A by PCR: NEGATIVE
Influenza B by PCR: NEGATIVE
Resp Syncytial Virus by PCR: NEGATIVE
SARS Coronavirus 2 by RT PCR: NEGATIVE

## 2024-03-23 LAB — CBC WITH DIFFERENTIAL/PLATELET
Abs Immature Granulocytes: 0.02 10*3/uL (ref 0.00–0.07)
Basophils Absolute: 0.1 10*3/uL (ref 0.0–0.1)
Basophils Relative: 2 %
Eosinophils Absolute: 0.5 10*3/uL (ref 0.0–0.5)
Eosinophils Relative: 9 %
HCT: 45.2 % (ref 39.0–52.0)
Hemoglobin: 15.2 g/dL (ref 13.0–17.0)
Immature Granulocytes: 0 %
Lymphocytes Relative: 20 %
Lymphs Abs: 1 10*3/uL (ref 0.7–4.0)
MCH: 32.7 pg (ref 26.0–34.0)
MCHC: 33.6 g/dL (ref 30.0–36.0)
MCV: 97.2 fL (ref 80.0–100.0)
Monocytes Absolute: 0.8 10*3/uL (ref 0.1–1.0)
Monocytes Relative: 16 %
Neutro Abs: 2.8 10*3/uL (ref 1.7–7.7)
Neutrophils Relative %: 53 %
Platelets: 220 10*3/uL (ref 150–400)
RBC: 4.65 MIL/uL (ref 4.22–5.81)
RDW: 14.2 % (ref 11.5–15.5)
WBC: 5.2 10*3/uL (ref 4.0–10.5)
nRBC: 0 % (ref 0.0–0.2)

## 2024-03-23 LAB — D-DIMER, QUANTITATIVE: D-Dimer, Quant: 0.62 ug{FEU}/mL — ABNORMAL HIGH (ref 0.00–0.50)

## 2024-03-23 LAB — BASIC METABOLIC PANEL WITH GFR
Anion gap: 12 (ref 5–15)
BUN: 11 mg/dL (ref 8–23)
CO2: 25 mmol/L (ref 22–32)
Calcium: 9.6 mg/dL (ref 8.9–10.3)
Chloride: 103 mmol/L (ref 98–111)
Creatinine, Ser: 0.78 mg/dL (ref 0.61–1.24)
GFR, Estimated: >60 mL/min
Glucose, Bld: 131 mg/dL — ABNORMAL HIGH (ref 70–99)
Potassium: 4.1 mmol/L (ref 3.5–5.1)
Sodium: 140 mmol/L (ref 135–145)

## 2024-03-23 LAB — CREATININE, SERUM
Creatinine, Ser: 0.89 mg/dL (ref 0.61–1.24)
GFR, Estimated: >60 mL/min

## 2024-03-23 LAB — TROPONIN T, HIGH SENSITIVITY
Troponin T High Sensitivity: 24 ng/L — ABNORMAL HIGH (ref 0–19)
Troponin T High Sensitivity: 20 ng/L — ABNORMAL HIGH (ref 0–19)

## 2024-03-23 MED ORDER — IPRATROPIUM-ALBUTEROL 0.5-2.5 (3) MG/3ML IN SOLN
3.0000 mL | Freq: Four times a day (QID) | RESPIRATORY_TRACT | Status: AC
  Administered 2024-03-23 – 2024-03-24 (×5): 3 mL via RESPIRATORY_TRACT
  Filled 2024-03-23 (×5): qty 3

## 2024-03-23 MED ORDER — ONDANSETRON HCL 4 MG PO TABS: 4.0000 mg | ORAL_TABLET | Freq: Four times a day (QID) | ORAL | Status: AC | PRN

## 2024-03-23 MED ORDER — SODIUM CHLORIDE 0.9% FLUSH: 3.0000 mL | INTRAVENOUS | Status: AC | PRN

## 2024-03-23 MED ORDER — ENOXAPARIN SODIUM 40 MG/0.4ML IJ SOSY
40.0000 mg | PREFILLED_SYRINGE | INTRAMUSCULAR | Status: AC
  Administered 2024-03-23 – 2024-03-24 (×2): 40 mg via SUBCUTANEOUS
  Filled 2024-03-23 (×2): qty 0.4

## 2024-03-23 MED ORDER — ALBUTEROL SULFATE (2.5 MG/3ML) 0.083% IN NEBU: 2.5000 mg | INHALATION_SOLUTION | RESPIRATORY_TRACT | Status: AC | PRN

## 2024-03-23 MED ORDER — ALBUTEROL SULFATE (2.5 MG/3ML) 0.083% IN NEBU: 10.0000 mg | INHALATION_SOLUTION | RESPIRATORY_TRACT | Status: DC

## 2024-03-23 MED ORDER — GUAIFENESIN ER 600 MG PO TB12
600.0000 mg | ORAL_TABLET | Freq: Two times a day (BID) | ORAL | Status: AC
  Administered 2024-03-23 – 2024-03-24 (×3): 600 mg via ORAL
  Filled 2024-03-23 (×3): qty 1

## 2024-03-23 MED ORDER — ALBUTEROL SULFATE (2.5 MG/3ML) 0.083% IN NEBU
2.5000 mg | INHALATION_SOLUTION | Freq: Four times a day (QID) | RESPIRATORY_TRACT | Status: DC | PRN
  Administered 2024-03-23: 2.5 mg via RESPIRATORY_TRACT
  Filled 2024-03-23: qty 3

## 2024-03-23 MED ORDER — AZITHROMYCIN 250 MG PO TABS
500.0000 mg | ORAL_TABLET | Freq: Every day | ORAL | Status: AC
  Administered 2024-03-23 – 2024-03-24 (×2): 500 mg via ORAL
  Filled 2024-03-23 (×2): qty 2

## 2024-03-23 MED ORDER — LOSARTAN POTASSIUM 50 MG PO TABS
25.0000 mg | ORAL_TABLET | Freq: Every day | ORAL | Status: AC
  Administered 2024-03-24: 25 mg via ORAL
  Filled 2024-03-23: qty 1

## 2024-03-23 MED ORDER — SENNA 8.6 MG PO TABS
1.0000 | ORAL_TABLET | ORAL | Status: AC
  Administered 2024-03-23: 8.6 mg via ORAL
  Filled 2024-03-23: qty 1

## 2024-03-23 MED ORDER — BUDESONIDE 0.25 MG/2ML IN SUSP
0.2500 mg | Freq: Two times a day (BID) | RESPIRATORY_TRACT | Status: AC
  Administered 2024-03-23 – 2024-03-24 (×3): 0.25 mg via RESPIRATORY_TRACT
  Filled 2024-03-23 (×3): qty 2

## 2024-03-23 MED ORDER — BENZONATATE 100 MG PO CAPS
200.0000 mg | ORAL_CAPSULE | Freq: Two times a day (BID) | ORAL | Status: AC | PRN
  Administered 2024-03-23 – 2024-03-24 (×2): 200 mg via ORAL
  Filled 2024-03-23 (×2): qty 2

## 2024-03-23 MED ORDER — EZETIMIBE 10 MG PO TABS
10.0000 mg | ORAL_TABLET | Freq: Every day | ORAL | Status: AC
  Administered 2024-03-24: 10 mg via ORAL
  Filled 2024-03-23: qty 1

## 2024-03-23 MED ORDER — ACETAMINOPHEN 650 MG RE SUPP: 650.0000 mg | Freq: Four times a day (QID) | RECTAL | Status: AC | PRN

## 2024-03-23 MED ORDER — ONDANSETRON HCL 4 MG/2ML IJ SOLN: 4.0000 mg | Freq: Four times a day (QID) | INTRAMUSCULAR | Status: AC | PRN

## 2024-03-23 MED ORDER — ACETAMINOPHEN 325 MG PO TABS: 650.0000 mg | ORAL_TABLET | Freq: Four times a day (QID) | ORAL | Status: AC | PRN

## 2024-03-23 MED ORDER — MONTELUKAST SODIUM 10 MG PO TABS
10.0000 mg | ORAL_TABLET | Freq: Every day | ORAL | Status: AC
  Administered 2024-03-23 – 2024-03-24 (×2): 10 mg via ORAL
  Filled 2024-03-23 (×2): qty 1

## 2024-03-23 MED ORDER — MAGNESIUM SULFATE 2 GM/50ML IV SOLN
2.0000 g | Freq: Once | INTRAVENOUS | Status: AC
  Administered 2024-03-23: 2 g via INTRAVENOUS
  Filled 2024-03-23: qty 50

## 2024-03-23 MED ORDER — SODIUM CHLORIDE 0.9 % IV SOLN: 250.0000 mL | INTRAVENOUS | Status: AC | PRN

## 2024-03-23 MED ORDER — METHYLPREDNISOLONE SODIUM SUCC 125 MG IJ SOLR
60.0000 mg | Freq: Two times a day (BID) | INTRAMUSCULAR | Status: AC
  Administered 2024-03-23 – 2024-03-24 (×3): 60 mg via INTRAVENOUS
  Filled 2024-03-23 (×3): qty 2

## 2024-03-23 MED ORDER — SODIUM CHLORIDE 0.9% FLUSH
3.0000 mL | Freq: Two times a day (BID) | INTRAVENOUS | Status: AC
  Administered 2024-03-23 – 2024-03-24 (×3): 3 mL via INTRAVENOUS

## 2024-03-23 MED ORDER — ALBUTEROL SULFATE (2.5 MG/3ML) 0.083% IN NEBU
INHALATION_SOLUTION | RESPIRATORY_TRACT | Status: AC
  Administered 2024-03-23: 10 mg via RESPIRATORY_TRACT
  Filled 2024-03-23: qty 12

## 2024-03-23 MED ORDER — METHYLPREDNISOLONE SODIUM SUCC 125 MG IJ SOLR: 60.0000 mg | Freq: Two times a day (BID) | INTRAMUSCULAR | Status: DC

## 2024-03-23 MED ORDER — PANTOPRAZOLE SODIUM 40 MG PO TBEC
40.0000 mg | DELAYED_RELEASE_TABLET | Freq: Every day | ORAL | Status: AC
  Administered 2024-03-23 – 2024-03-24 (×2): 40 mg via ORAL
  Filled 2024-03-23 (×2): qty 1

## 2024-03-23 MED ORDER — DEXAMETHASONE SOD PHOSPHATE PF 10 MG/ML IJ SOLN
10.0000 mg | Freq: Once | INTRAMUSCULAR | Status: AC
  Administered 2024-03-23: 10 mg via INTRAVENOUS
  Filled 2024-03-23: qty 1

## 2024-03-23 MED ORDER — IPRATROPIUM BROMIDE 0.02 % IN SOLN
0.5000 mg | Freq: Once | RESPIRATORY_TRACT | Status: AC
  Administered 2024-03-23: 0.5 mg via RESPIRATORY_TRACT
  Filled 2024-03-23: qty 2.5

## 2024-03-23 MED ORDER — ALBUTEROL (5 MG/ML) CONTINUOUS INHALATION SOLN: 10.0000 mg/h | INHALATION_SOLUTION | Freq: Once | RESPIRATORY_TRACT | Status: DC

## 2024-03-23 MED ORDER — POLYETHYLENE GLYCOL 3350 17 G PO PACK: 17.0000 g | PACK | Freq: Every day | ORAL | Status: AC | PRN

## 2024-03-23 MED ORDER — ALBUTEROL SULFATE (2.5 MG/3ML) 0.083% IN NEBU
10.0000 mg | INHALATION_SOLUTION | Freq: Once | RESPIRATORY_TRACT | Status: AC
  Administered 2024-03-23: 10 mg via RESPIRATORY_TRACT
  Filled 2024-03-23: qty 12

## 2024-03-23 MED ORDER — FLUTICASONE PROPIONATE 50 MCG/ACT NA SUSP
2.0000 | Freq: Every day | NASAL | Status: AC
  Administered 2024-03-24: 2 via NASAL
  Filled 2024-03-23: qty 16

## 2024-03-23 MED ORDER — IPRATROPIUM-ALBUTEROL 0.5-2.5 (3) MG/3ML IN SOLN
3.0000 mL | Freq: Four times a day (QID) | RESPIRATORY_TRACT | Status: DC
  Administered 2024-03-23: 3 mL via RESPIRATORY_TRACT
  Filled 2024-03-23: qty 3

## 2024-03-23 NOTE — ED Notes (Signed)
 Consult for hospitalist called to [facility]. Spoke with Ruben Henry

## 2024-03-23 NOTE — ED Notes (Signed)
 While changing bed for move to another room.  With exertion work of breathing increased significantly.  Continuous neb continues with 10 mg albuterol  on 2 lpm [place].  BBS ins/exp wheezes with prolonged expiratory phase.

## 2024-03-23 NOTE — ED Provider Notes (Signed)
 " [place] [facility] Provider Note   CSN: [id number] Arrival date & time: 03/23/24  9155     Patient presents with: Wheezing   Ruben Henry is a 79 y.o. male.  79 year old male presents emergency department with complaints of asthma exacerbation.  Patient reports he started having shortness of breath last night and tried 2 rounds of nebulizer and several inhaler puffs at home.  Patient reports he was still actively wheezing this morning and came to emergency department after calling his pulmonary doctor.  He reports several episodes of asthma exacerbation over the last 4 years and he reports they always resolved with prednisone .  He has a appointment with his pulmonologist next Tuesday for reassessment of asthma.  Patient reports he is not been able to ambulate more than 50 feet without extreme shortness of breath over the last couple days.  Patient denies any chest pain, dizziness, recent illness, fever, headache, or weakness.     Prior to Admission medications  Medication Sig Start Date End Date Taking? Authorizing Provider  albuterol  (VENTOLIN  HFA) 108 (90 Base) MCG/ACT inhaler USE 2 INHALATIONS BY MOUTH EVERY 6 HOURS AS NEEDED FOR WHEEZING  OR SHORTNESS OF BREATH 01/25/23   Saguier, [place], PA-C  benzonatate  (TESSALON ) 100 MG capsule Take 1 capsule (100 mg total) by mouth 3 (three) times daily as needed for cough. 08/30/23   Saguier, [place], PA-C  benzonatate  (TESSALON ) 100 MG capsule Take 1 capsule (100 mg total) by mouth 3 (three) times daily as needed for cough. 01/20/24   Saguier, [place], PA-C  budesonide -formoterol  (BREYNA ) 80-4.5 MCG/ACT inhaler Inhale 2 puffs into the lungs as needed.    [provider]  budesonide -formoterol  (SYMBICORT ) 80-4.5 MCG/ACT inhaler Take 2 puffs first thing in am and then another 2 puffs about 12 hours later. 12/16/23   Darlean Ozell NOVAK, MD  ezetimibe  (ZETIA ) 10 MG tablet Take 1 tablet by mouth once daily  02/23/24   Saguier, [place], PA-C  fluticasone  (FLONASE ) 50 MCG/ACT nasal spray Place 2 sprays into both nostrils daily. 12/20/20   Saguier, [place], PA-C  losartan  (COZAAR ) 25 MG tablet Take 1 tablet by mouth once daily 02/23/24   Saguier, [place], PA-C  montelukast  (SINGULAIR ) 10 MG tablet Take 1 tablet (10 mg total) by mouth at bedtime. 08/31/23   Meade Verdon RAMAN, MD  predniSONE  (STERAPRED UNI-PAK 21 TAB) 10 MG (21) TBPK tablet Standard 6 day taper dose.. 01/20/24   Saguier, [place], PA-C  senna (SENOKOT) 8.6 MG tablet Take 1 tablet by mouth See admin instructions. Take 1 tablet by mouth every other night    [provider]    Allergies: Patient has no known allergies.    Review of Systems  Respiratory:  Positive for cough, shortness of breath and wheezing.   All other systems reviewed and are negative.   Updated Vital Signs BP 130/72   Pulse (!) 110   Temp 97.7 F (36.5 C) (Oral)   Resp 15   Wt 86.2 kg   SpO2 93%   BMI 27.26 kg/m   Physical Exam Vitals and nursing note reviewed.  Constitutional:      General: He is not in acute distress.    Appearance: Normal appearance. He is ill-appearing. He is not toxic-appearing.     Comments: Patient has increased respiratory drive  HENT:     Head: Normocephalic and atraumatic.     Nose: Nose normal.  Eyes:     Extraocular Movements: Extraocular movements intact.  Conjunctiva/sclera: Conjunctivae normal.     Pupils: Pupils are equal, round, and reactive to light.  Cardiovascular:     Rate and Rhythm: Normal rate.  Pulmonary:     Effort: Respiratory distress present.     Comments: Patient has diffuse wheezing throughout all fields. Abdominal:     General: Abdomen is flat. There is no distension.     Tenderness: There is no abdominal tenderness.  Musculoskeletal:        General: Normal range of motion.     Cervical back: Normal range of motion.     Comments: No swelling noted in bilateral lower extremities.  Skin:     General: Skin is warm.     Capillary Refill: Capillary refill takes less than 2 seconds.  Neurological:     General: No focal deficit present.     Mental Status: He is alert.  Psychiatric:        Mood and Affect: Mood normal.        Behavior: Behavior normal.     (all labs ordered are listed, but only abnormal results are displayed) Labs Reviewed  BASIC METABOLIC PANEL WITH GFR - Abnormal; Notable for the following components:      Result Value   Glucose, Bld 131 (*)    All other components within normal limits  D-DIMER, QUANTITATIVE - Abnormal; Notable for the following components:   D-Dimer, Quant 0.62 (*)    All other components within normal limits  TROPONIN T, HIGH SENSITIVITY - Abnormal; Notable for the following components:   Troponin T High Sensitivity 24 (*)    All other components within normal limits  TROPONIN T, HIGH SENSITIVITY - Abnormal; Notable for the following components:   Troponin T High Sensitivity 20 (*)    All other components within normal limits  CBC WITH DIFFERENTIAL/PLATELET    EKG: None  Radiology: DG Chest Portable 1 View Result Date: 03/23/2024 CLINICAL DATA:  Wheezing and shortness of breath EXAM: PORTABLE CHEST 1 VIEW COMPARISON:  January 20, 2024 FINDINGS: The heart size and mediastinal contours are within normal limits. Both lungs are clear. The visualized skeletal structures are unremarkable. IMPRESSION: Normal Electronically Signed   By: Nancyann Burns M.D.   On: 03/23/2024 10:37     Procedures   Medications Ordered in the ED  albuterol  (PROVENTIL ) (2.5 MG/3ML) 0.083% nebulizer solution 10 mg (0 mg Nebulization Stopped 03/23/24 1050)  ipratropium-albuterol  (DUONEB) 0.5-2.5 (3) MG/3ML nebulizer solution 3 mL (3 mLs Nebulization Given 03/23/24 1428)  albuterol  (PROVENTIL ) (2.5 MG/3ML) 0.083% nebulizer solution 2.5 mg (2.5 mg Nebulization Given 03/23/24 1428)  dexamethasone  (DECADRON ) injection 10 mg (10 mg Intravenous Given 03/23/24 0946)   ipratropium (ATROVENT ) nebulizer solution 0.5 mg (0.5 mg Nebulization Given 03/23/24 1121)  albuterol  (PROVENTIL ) (2.5 MG/3ML) 0.083% nebulizer solution 10 mg (10 mg Nebulization Given 03/23/24 1121)  magnesium  sulfate IVPB 2 g 50 mL (0 g Intravenous Stopped 03/23/24 1235)    79 y.o. male presents to the ED with complaints of asthma exacerbation,  The differential diagnosis includes asthma exacerbation, COPD, pneumonia, ACS, PE (Ddx)  On arrival pt is nontoxic, vitals remarkable for tachypnea and low saturation on room air. Exam significant for diffuse wheezing  I ordered medication albuterol  and Decadron  for asthma exacerbation  Lab Tests:  BMP CBC D-dimer troponin CBC unremarkable.  D-dimer elevated at 0.62 age-adjusted D-dimer cutoff is 0.77. Troponin was downtrending. No other significant findings noted on blood work.  Imaging Studies ordered:  I ordered imaging studies which included  chest x-ray, no acute abnormality  ED Course:   Patient initially satting 85% on room air with increased work of breathing.  Patient will be started on continuous albuterol  and reassess.  Patient reports he has had multiple episodes of these asthma exacerbations which require prednisone  for relief.  Patient will be started on Decadron  in ED and reevaluated after albuterol .  On reassessment work of breathing has improved, patient reports feeling much better.  There is still notable wheezing throughout.  Will continue on albuterol .  On assessment patient reports he feels much better he is still on 2 L and there is still diffuse wheezing throughout.  Will repeat albuterol  with Atrovent .  Patient was reevaluated off of oxygen at bedside.  There was still wheezing noted throughout.  Patient reported he was very much against admission.  At this time patient will be trialed on ambulation.  Patient ambulated and had increased work of breathing and desatted into the 80s.  Patient was started on repeat  albuterol .  Patient still had diffuse wheezing throughout.  Will admit patient at this time for asthma exacerbation.  Will start patient on another albuterol .  Dr. Raenelle was consulted with hospitalist.  He agreed to admit patient at this time for asthma exacerbation and respiratory distress.  Portions of this note were generated with Scientist, clinical (histocompatibility and immunogenetics). Dictation errors may occur despite best attempts at proofreading.   Final diagnoses:  Severe persistent asthma with exacerbation [facility])    ED Discharge Orders     None          Myriam Fonda RAMAN, [place] 03/23/24 1455  "

## 2024-03-23 NOTE — ED Notes (Signed)
Hospitalist consult called to [place].

## 2024-03-23 NOTE — H&P (Signed)
 " Telemedicine History and Physical   Referring Provider: Fonda Siva PA-C Telemedicine Provider: Donalda Applebaum MD Provider Location: [facility] Patient Location: [facility] ED Referring Diagnosis: Asthma exacerbation Patient Name and DOB verified: yes Patient consented to Telemedicine Evaluation:yes RN virtual assistant: Quintin Abernethy RN Video encounter time and date:03/23/24 at 3:35 PM.   Patient: Ruben Henry FMW:[id number] DOB: 1945-04-04 PCP: Dorina Loving, PA-C    Chief Complaint:  Chief Complaint  Patient presents with   Wheezing   HPI: Ruben Henry is a 79 y.o. male with medical history significant of asthma, HTN, HLD who presented with shortness of breath.  Per patient-approximately a week or so ago-he started having a dry cough-and some mild shortness of breath.  He started taking his inhalers-unfortunately even with these interventions-he continued to slowly worsen.  Over the past several days-he continued to cough-cough is now productive-and patient claims that he has had significant amount of mucus when he coughs.  For the past 2 days-his shortness of breath has markedly worsened-and he was unable to do his daily activities of living-and could barely walk a few feet without stopping to catch his breath.  Due to continued worsening of his symptoms-he presented to [facility] earlier today-where he was found to have asthma exacerbation-grossly wheezing on exam-he was given steroids-bronchodilators-by the time this MD got to evaluate this patient virtually-patient was already feeling significantly better and appeared very comfortable and was easily talking in full sentences.  No fever No headache No chest pain No nausea, vomiting or diarrhea No abdominal pain No hematochezia/melena No hematuria   Review of Systems: As mentioned in the history of present illness. All other systems reviewed and are negative. 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   CAD (coronary artery disease) 07/20/2013   s/p remote PCI of RCA and LCX in 1998   Hypercholesterolemia    Hypertension    Low hemoglobin 06/02/2023   Osteoarthritis of right hip    Prostate cancer [facility])    brachytherapy   Past Surgical History:  Procedure Laterality Date   CORONARY ANGIOPLASTY WITH STENT PLACEMENT  06/21/1996   prior angioplasty of the RCA with stenting of the LCX in 1998   ESOPHAGOGASTRODUODENOSCOPY N/A 06/03/2023   Procedure: EGD (ESOPHAGOGASTRODUODENOSCOPY);  Surgeon: Dianna Specking, MD;  Location: [facility];  Service: Gastroenterology;  Laterality: N/A;   EYE SURGERY  1969   Right eye /    TONSILLECTOMY AND ADENOIDECTOMY     Social History:  reports that he has never smoked. He has never used smokeless tobacco. He reports current alcohol use of about 14.0 standard drinks of alcohol per week. He reports that he does not use drugs.  Allergies[1]  Family History  Problem Relation Age of Onset   Heart failure Mother    Esophageal cancer Father        died age 66, exp to asbestos   Cancer Father    Diabetes Sister    Allergies Sister    Heart attack Maternal Uncle 73   Coronary artery disease Maternal Uncle 28       with CABG    Prior to Admission medications  Medication Sig Start Date End Date Taking? Authorizing Provider  albuterol  (VENTOLIN  HFA) 108 (90 Base) MCG/ACT inhaler USE 2 INHALATIONS BY MOUTH EVERY 6 HOURS AS NEEDED FOR WHEEZING  OR SHORTNESS OF BREATH 01/25/23   Saguier,  [place], PA-C  benzonatate  (TESSALON ) 100 MG capsule Take 1 capsule (100 mg total) by mouth 3 (three) times daily as needed for cough. 08/30/23   Saguier, [place], PA-C  benzonatate  (TESSALON ) 100 MG capsule Take 1 capsule (100 mg total) by mouth 3 (three) times daily as needed for cough. 01/20/24   Saguier, [place], PA-C  budesonide -formoterol  (BREYNA ) 80-4.5 MCG/ACT inhaler  Inhale 2 puffs into the lungs as needed.    [provider]  budesonide -formoterol  (SYMBICORT ) 80-4.5 MCG/ACT inhaler Take 2 puffs first thing in am and then another 2 puffs about 12 hours later. 12/16/23   Darlean Ozell NOVAK, MD  ezetimibe  (ZETIA ) 10 MG tablet Take 1 tablet by mouth once daily 02/23/24   Saguier, [place], PA-C  fluticasone  (FLONASE ) 50 MCG/ACT nasal spray Place 2 sprays into both nostrils daily. 12/20/20   Saguier, [place], PA-C  losartan  (COZAAR ) 25 MG tablet Take 1 tablet by mouth once daily 02/23/24   Saguier, [place], PA-C  montelukast  (SINGULAIR ) 10 MG tablet Take 1 tablet (10 mg total) by mouth at bedtime. 08/31/23   Meade Verdon RAMAN, MD  predniSONE  (STERAPRED UNI-PAK 21 TAB) 10 MG (21) TBPK tablet Standard 6 day taper dose.. 01/20/24   Saguier, [place], PA-C  senna (SENOKOT) 8.6 MG tablet Take 1 tablet by mouth See admin instructions. Take 1 tablet by mouth every other night    [provider]    Physical Exam: done by Anissa Rayfield RN during this encounter Vitals:   03/23/24 1000 03/23/24 1049 03/23/24 1300 03/23/24 1720  BP: (!) 143/74  130/72 (!) 155/105  Pulse: 89  (!) 110 (!) 110  Resp: 15  15 20   Temp:    97.9 F (36.6 C)  TempSrc:      SpO2: 96% 97% 93%   Weight:       Gen Exam:Alert awake-not in any distress HEENT:atraumatic, normocephalic Chest: Moving air well except at bases-some scattered rhonchi. CVS:S1S2 regular Abdomen:soft non tender, non distended Extremities:no edema Neurology: Non focal Skin: no rash  Data Reviewed:    Latest Ref Rng & Units 03/23/2024    9:27 AM 11/29/2023   11:29 AM 10/12/2023   10:10 AM  CBC  WBC 4.0 - 10.5 K/uL 5.2  6.1  6.6   Hemoglobin 13.0 - 17.0 g/dL 84.7  85.4  85.7   Hematocrit 39.0 - 52.0 % 45.2  43.7  41.6   Platelets 150 - 400 K/uL 220  171.0  182         Latest Ref Rng & Units 03/23/2024    9:27 AM 11/29/2023   11:29 AM 10/12/2023   10:10 AM  BMP  Glucose 70 - 99 mg/dL 868  94  73   BUN 8 - 23  mg/dL 11  10  7    Creatinine 0.61 - 1.24 mg/dL 9.21  9.18  9.13   Sodium 135 - 145 mmol/L 140  137  139   Potassium 3.5 - 5.1 mmol/L 4.1  4.2  4.2   Chloride 98 - 111 mmol/L 103  102  104   CO2 22 - 32 mmol/L 25  27  23    Calcium 8.9 - 10.3 mg/dL 9.6  9.2  9.5      Assessment and Plan: Asthma exacerbation Already improving after receiving steroids/bronchodilators in the emergency room Check influenza/COVID/RSV PCR Continue steroids/scheduled bronchodilators/Zithromax  x 3 days Continue antitussives Encourage mobilization/incentive spirometry/flutter valve Follow closely.  HTN Continue losartan   HLD Continue Zetia   History of CAD-s/p remote PCI somewhere  around 1998 No anginal symptoms Per patient he no longer takes any antiplatelets.   Advance Care Planning:   Code Status: Full Code   Consults: None  Family Communication: Spouse at bedside  Severity of Illness: The appropriate patient status for this patient is INPATIENT. Inpatient status is judged to be reasonable and necessary in order to provide the required intensity of service to ensure the patient's safety. The patient's presenting symptoms, physical exam findings, and initial radiographic and laboratory data in the context of their chronic comorbidities is felt to place them at high risk for further clinical deterioration. Furthermore, it is not anticipated that the patient will be medically stable for discharge from the hospital within 2 midnights of admission.   * I certify that at the point of admission it is my clinical judgment that the patient will require inpatient hospital care spanning beyond 2 midnights from the point of admission due to high intensity of service, high risk for further deterioration and high frequency of surveillance required.*  Author: Donalda Applebaum, MD 03/23/2024 5:45 PM  For on call review [email or web address].      [1] No Known Allergies  "

## 2024-03-23 NOTE — Telephone Encounter (Signed)
 I called and spoke to pt's wife, Roselind. DPR. Roselind states pt is currently in the ER but will let us  know if he needs anything. Pt is still scheduled to see Dr Darlean on 03-28-24. NFN

## 2024-03-23 NOTE — ED Notes (Signed)
 [facility] called for transportation

## 2024-03-23 NOTE — ED Notes (Signed)
 Ambulated on room air HR 116, SpO2 91%,@ start. Ambulated approximately  250 feet, HR 124, SpO2 84% at lowest. Returned to room placed on 2lpm SpO2 recovered to 96%.  Obvious increased work of breathing, accessory muscle use, purse lip breathing, BBS exp wheezes, prolonged expiratory phase.  RN aware.

## 2024-03-23 NOTE — Telephone Encounter (Signed)
 FYI Only or Action Required?: Action required by provider: clinical question for provider, update on patient condition, and requesting call back asap.  Patient is followed in Pulmonology for asthma, last seen on 12/16/2023 by Darlean Ozell NOVAK, MD.  Called Nurse Triage reporting Shortness of Breath.  Symptoms began several days ago.  Interventions attempted: Nebulizer treatments.  Symptoms are: rapidly worsening.  Triage Disposition: Call EMS 911 Now  Patient/caregiver understands and will follow disposition?: No, refuses disposition      Reason for Disposition  SEVERE difficulty breathing (e.g., struggling for each breath, speaks in single words)  Answer Assessment - Initial Assessment Questions Pt and wife called several times over the couple days to request prednisone  for pt symptoms with no response. Pt wife now requesting call back asap from doc, also requesting explanation for wait. This RN recommended pt be examined in hospital asap, pt wife refusing until call back from doc. Advised pt call 911 or get to hospital asap if any new or worsening symptoms. Sending message to [facility] office for call back to pt/wife asap with further recommendations.        1. RESPIRATORY STATUS: Describe your breathing? (e.g., wheezing, shortness of breath, unable to speak, severe coughing)      Pt using nebulizer right now Wife confirms that probably yes pt was taking a breath for every word prior to nebulizer, now speaking in phrases with nebulizer Lot of wheezing yesterday Used nebulizer 2x yesterday and now this morning  2. ONSET: When did this breathing problem begin?      Few days ago  5. RECURRENT SYMPTOM: Have you had difficulty breathing before? If Yes, ask: When was the last time? and What happened that time?      Last time prednisone  helped  6. CARDIAC HISTORY: Do you have any history of heart disease? (e.g., heart attack, angina, bypass surgery, angioplasty)       Significant  7. LUNG HISTORY: Do you have any history of lung disease?  (e.g., pulmonary embolus, asthma, emphysema)     Significant  9. OTHER SYMPTOMS: Do you have any other symptoms? (e.g., chest pain, cough, dizziness, fever, runny nose)     Fatigue Congestion Yellow sputum with cough  10. O2 SATURATION MONITOR:  Do you use an oxygen saturation monitor (pulse oximeter) at home? If Yes, ask: What is your reading (oxygen level) today? What is your usual oxygen saturation reading? (e.g., 95%)       Not taken  Denies: Currently struggling to breathe Chest pain SOB Stridor  Protocols used: Breathing Difficulty-A-AH

## 2024-03-23 NOTE — Telephone Encounter (Signed)
 Pt currently admitted in ER.  Pt scheduled to see Dr. Darlean 2/10

## 2024-03-23 NOTE — Treatment Plan (Addendum)
 Virtual admit from [facility] arrived at [facility]  H&P and sign out pending.  I discussed with admitting provider by secure chat.    Vitals:   03/23/24 1049 03/23/24 1300  BP:  130/72  Pulse:  (!) 110  Resp:  15  Temp:    SpO2: 97% 93%   Labs with mildly elevated troponin, flat.  Elevated d dimer, age adjusts.    EKG is pending.  Virtual admitter will remain primary call, but if any bedside needs arise, I'm available.  See pending H&P for additional details.

## 2024-03-23 NOTE — ED Triage Notes (Addendum)
 Wheezing and SOB since Tuesday. Worse last night Last used nebulizer PTA. Sats 85% on room air intriage

## 2024-03-24 LAB — COMPREHENSIVE METABOLIC PANEL WITH GFR
ALT: 30 U/L (ref 0–44)
AST: 40 U/L (ref 15–41)
Albumin: 4.5 g/dL (ref 3.5–5.0)
Alkaline Phosphatase: 108 U/L (ref 38–126)
Anion gap: 16 — ABNORMAL HIGH (ref 5–15)
BUN: 14 mg/dL (ref 8–23)
CO2: 23 mmol/L (ref 22–32)
Calcium: 10 mg/dL (ref 8.9–10.3)
Chloride: 98 mmol/L (ref 98–111)
Creatinine, Ser: 0.85 mg/dL (ref 0.61–1.24)
GFR, Estimated: >60 mL/min
Glucose, Bld: 210 mg/dL — ABNORMAL HIGH (ref 70–99)
Potassium: 4.2 mmol/L (ref 3.5–5.1)
Sodium: 137 mmol/L (ref 135–145)
Total Bilirubin: 0.9 mg/dL (ref 0.0–1.2)
Total Protein: 8.4 g/dL — ABNORMAL HIGH (ref 6.5–8.1)

## 2024-03-24 LAB — CBC
HCT: 47.5 % (ref 39.0–52.0)
Hemoglobin: 15.5 g/dL (ref 13.0–17.0)
MCH: 32.5 pg (ref 26.0–34.0)
MCHC: 32.6 g/dL (ref 30.0–36.0)
MCV: 99.6 fL (ref 80.0–100.0)
Platelets: 253 10*3/uL (ref 150–400)
RBC: 4.77 MIL/uL (ref 4.22–5.81)
RDW: 14.3 % (ref 11.5–15.5)
WBC: 11.4 10*3/uL — ABNORMAL HIGH (ref 4.0–10.5)
nRBC: 0 % (ref 0.0–0.2)

## 2024-03-24 MED ORDER — ARFORMOTEROL TARTRATE 15 MCG/2ML IN NEBU
15.0000 ug | INHALATION_SOLUTION | Freq: Two times a day (BID) | RESPIRATORY_TRACT | Status: AC
  Administered 2024-03-24 (×2): 15 ug via RESPIRATORY_TRACT
  Filled 2024-03-24 (×2): qty 2

## 2024-03-24 NOTE — Progress Notes (Signed)
 " PROGRESS NOTE    [name]  FMW:[id number] DOB: December 19, 1945 DOA: 03/23/2024 PCP: Dorina Loving, PA-C  Chief Complaint  Patient presents with   Wheezing    Brief Narrative:   Ruben Henry is Ruben Henry 79 y.o. male with medical history significant of asthma, HTN, HLD who presented with shortness of breath.   Admitted with an asthma exacerbation.   Assessment & Plan:   Principal Problem:   Asthma exacerbation Active Problems:   Hypertension   Hypercholesterolemia   CAD (coronary artery disease)  Asthma exacerbation On 3 L O2 today, wheezing Negative covid, flu, RSV Steroids, scheduled and prn bronchodilators Zithromax  x 3 days Encourage mobilization/incentive spirometry/flutter valve Wean as tolerated Has appt next week on Tuesday with Dr. Darlean   HTN Continue losartan    HLD Continue Zetia    History of CAD-s/p remote PCI somewhere around 1998 No anginal symptoms Per patient he no longer takes any antiplatelets.    DVT prophylaxis: lovenox  Code Status: full Family Communication: none Disposition:   Status is: Inpatient Remains inpatient appropriate because: need for continued inpatient care   Consultants:  none  Procedures:  none  Antimicrobials:  Anti-infectives (From admission, onward)    Start     Dose/Rate Route Frequency Ordered Stop   03/23/24 1830  azithromycin  (ZITHROMAX ) tablet 500 mg        500 mg Oral Daily 03/23/24 1744 03/26/24 0959       Subjective: Feels Winner Valeriano little better, still DOE  Objective: Vitals:   03/24/24 0958 03/24/24 0959 03/24/24 1105 03/24/24 1256  BP:   (!) 157/100 139/80  Pulse:   100 95  Resp:   20 20  Temp:   97.6 F (36.4 C) 97.6 F (36.4 C)  TempSrc:   Oral   SpO2: 98% 98%  96%  Weight:        Intake/Output Summary (Last 24 hours) at 03/24/2024 1325 Last data filed at 03/24/2024 0555 Gross per 24 hour  Intake --  Output 250 ml  Net -250 ml   Filed Weights   03/23/24 0857  Weight: 86.2 kg     Examination:  General exam: Appears calm and comfortable  Respiratory system: diffuse coarse wheezing - on 2-3 L  Cardiovascular system: RRR Gastrointestinal system: Abdomen is nondistended, soft and nontender. No organomegaly or masses felt. Normal bowel sounds heard. Central nervous system: Alert and oriented. No focal neurological deficits. Extremities: no LEE   Data Reviewed: I have personally reviewed following labs and imaging studies  CBC: Recent Labs  Lab 03/23/24 0927 03/23/24 1758 03/24/24 0808  WBC 5.2 5.5 11.4*  NEUTROABS 2.8  --   --   HGB 15.2 15.0 15.5  HCT 45.2 44.6 47.5  MCV 97.2 99.3 99.6  PLT 220 243 253    Basic Metabolic Panel: Recent Labs  Lab 03/23/24 0927 03/23/24 1758 03/24/24 0808  NA 140  --  137  K 4.1  --  4.2  CL 103  --  98  CO2 25  --  23  GLUCOSE 131*  --  210*  BUN 11  --  14  CREATININE 0.78 0.89 0.85  CALCIUM 9.6  --  10.0    GFR: Estimated Creatinine Clearance: 75.1 mL/min (by C-G formula based on SCr of 0.85 mg/dL).  Liver Function Tests: Recent Labs  Lab 03/24/24 0808  AST 40  ALT 30  ALKPHOS 108  BILITOT 0.9  PROT 8.4*  ALBUMIN 4.5    CBG: No results for input(s):  GLUCAP in the last 168 hours.   Recent Results (from the past 240 hours)  Resp panel by RT-PCR (RSV, Flu Gaile Allmon&B, Covid) Anterior Nasal Swab     Status: None   Collection Time: 03/23/24  6:09 PM   Specimen: Anterior Nasal Swab  Result Value Ref Range Status   SARS Coronavirus 2 by RT PCR NEGATIVE NEGATIVE Final    Comment: (NOTE) SARS-CoV-2 target nucleic acids are NOT DETECTED.  The SARS-CoV-2 RNA is generally detectable in upper respiratory specimens during the acute phase of infection. The lowest concentration of SARS-CoV-2 viral copies this assay can detect is 138 copies/mL. Vittorio Mohs negative result does not preclude SARS-Cov-2 infection and should not be used as the sole basis for treatment or other patient management decisions. Diara Chaudhari negative  result may occur with  improper specimen collection/handling, submission of specimen other than nasopharyngeal swab, presence of viral mutation(s) within the areas targeted by this assay, and inadequate number of viral copies(<138 copies/mL). Dennisha Mouser negative result must be combined with clinical observations, patient history, and epidemiological information. The expected result is Negative.  Fact Sheet for Patients:  bloggercourse.com  Fact Sheet for Healthcare Providers:  seriousbroker.it  This test is no t yet approved or cleared by the [place]  FDA and  has been authorized for detection and/or diagnosis of SARS-CoV-2 by FDA under an Emergency Use Authorization (EUA). This EUA will remain  in effect (meaning this test can be used) for the duration of the COVID-19 declaration under Section 564(b)(1) of the Act, 21 U.S.C.section 360bbb-3(b)(1), unless the authorization is terminated  or revoked sooner.       Influenza Britley Gashi by PCR NEGATIVE NEGATIVE Final   Influenza B by PCR NEGATIVE NEGATIVE Final    Comment: (NOTE) The Xpert Xpress SARS-CoV-2/FLU/RSV plus assay is intended as an aid in the diagnosis of influenza from Nasopharyngeal swab specimens and should not be used as Kynsleigh Westendorf sole basis for treatment. Nasal washings and aspirates are unacceptable for Xpert Xpress SARS-CoV-2/FLU/RSV testing.  Fact Sheet for Patients: bloggercourse.com  Fact Sheet for Healthcare Providers: seriousbroker.it  This test is not yet approved or cleared by the [place]  FDA and has been authorized for detection and/or diagnosis of SARS-CoV-2 by FDA under an Emergency Use Authorization (EUA). This EUA will remain in effect (meaning this test can be used) for the duration of the COVID-19 declaration under Section 564(b)(1) of the Act, 21 U.S.C. section 360bbb-3(b)(1), unless the authorization is  terminated or revoked.     Resp Syncytial Virus by PCR NEGATIVE NEGATIVE Final    Comment: (NOTE) Fact Sheet for Patients: bloggercourse.com  Fact Sheet for Healthcare Providers: seriousbroker.it  This test is not yet approved or cleared by the [place]  FDA and has been authorized for detection and/or diagnosis of SARS-CoV-2 by FDA under an Emergency Use Authorization (EUA). This EUA will remain in effect (meaning this test can be used) for the duration of the COVID-19 declaration under Section 564(b)(1) of the Act, 21 U.S.C. section 360bbb-3(b)(1), unless the authorization is terminated or revoked.  Performed at [facility], [address] [place], [place] [address]          Radiology Studies: DG Chest Portable 1 View Result Date: 03/23/2024 CLINICAL DATA:  Wheezing and shortness of breath EXAM: PORTABLE CHEST 1 VIEW COMPARISON:  January 20, 2024 FINDINGS: The heart size and mediastinal contours are within normal limits. Both lungs are clear. The visualized skeletal structures are unremarkable. IMPRESSION: Normal Electronically Signed   By:  Nancyann Burns M.D.   On: 03/23/2024 10:37        Scheduled Meds:  arformoterol   15 mcg Nebulization BID   azithromycin   500 mg Oral Daily   budesonide  (PULMICORT ) nebulizer solution  0.25 mg Nebulization BID   enoxaparin  (LOVENOX ) injection  40 mg Subcutaneous Q24H   ezetimibe   10 mg Oral Daily   fluticasone   2 spray Each Nare Daily   guaiFENesin   600 mg Oral BID   ipratropium-albuterol   3 mL Nebulization Q6H   losartan   25 mg Oral Daily   methylPREDNISolone  (SOLU-MEDROL ) injection  60 mg Intravenous Q12H   montelukast   10 mg Oral QHS   pantoprazole   40 mg Oral Daily   senna  1 tablet Oral Q48H   sodium chloride  flush  3 mL Intravenous Q12H   Continuous Infusions:  sodium chloride        LOS: 1 day    Time spent: over 30 min     Meliton Monte, MD [facility]   To contact the attending provider between 7A-7P or the covering provider during after hours 7P-7A, please log into the web site [email or web address] and access using universal [place] password for that web site. If you do not have the password, please call the hospital operator.  03/24/2024, 1:25 PM    "

## 2024-03-24 NOTE — Progress Notes (Signed)
" °   03/24/24 1516  TOC Brief Assessment  Insurance and Status Reviewed  Patient has primary care physician Yes  Home environment has been reviewed Condo  Prior level of function: Independent  Prior/Current Home Services No current home services  Social Drivers of Health Review SDOH reviewed no interventions necessary  Readmission risk has been reviewed Yes  Transition of care needs transition of care needs identified, TOC will continue to follow    Signed: Heather Saltness, MSW, LCSW Clinical Social Worker Inpatient Care Management 03/24/2024 3:17 PM   "

## 2024-03-24 NOTE — Plan of Care (Signed)
   Problem: Activity: Goal: Risk for activity intolerance will decrease Outcome: Not Progressing

## 2024-03-28 ENCOUNTER — Ambulatory Visit: Admitting: Internal Medicine

## 2024-04-11 ENCOUNTER — Other Ambulatory Visit

## 2024-06-13 ENCOUNTER — Ambulatory Visit
